# Patient Record
Sex: Female | Born: 1975 | Race: Black or African American | Hispanic: No | Marital: Single | Smoking: Never smoker
Health system: Southern US, Community
[De-identification: ages and names within clinical notes are randomized; demographics above are authoritative.]

## PROBLEM LIST (undated history)

## (undated) DIAGNOSIS — N3946 Mixed incontinence: Secondary | ICD-10-CM

## (undated) DIAGNOSIS — J3089 Other allergic rhinitis: Secondary | ICD-10-CM

## (undated) DIAGNOSIS — D259 Leiomyoma of uterus, unspecified: Secondary | ICD-10-CM

## (undated) DIAGNOSIS — G4700 Insomnia, unspecified: Secondary | ICD-10-CM

## (undated) DIAGNOSIS — N920 Excessive and frequent menstruation with regular cycle: Secondary | ICD-10-CM

## (undated) DIAGNOSIS — F32A Depression, unspecified: Secondary | ICD-10-CM

## (undated) DIAGNOSIS — N760 Acute vaginitis: Secondary | ICD-10-CM

## (undated) DIAGNOSIS — K635 Polyp of colon: Secondary | ICD-10-CM

## (undated) DIAGNOSIS — E785 Hyperlipidemia, unspecified: Secondary | ICD-10-CM

## (undated) DIAGNOSIS — Z8042 Family history of malignant neoplasm of prostate: Secondary | ICD-10-CM

## (undated) DIAGNOSIS — E119 Type 2 diabetes mellitus without complications: Secondary | ICD-10-CM

## (undated) DIAGNOSIS — M1990 Unspecified osteoarthritis, unspecified site: Secondary | ICD-10-CM

## (undated) DIAGNOSIS — G4733 Obstructive sleep apnea (adult) (pediatric): Secondary | ICD-10-CM

## (undated) DIAGNOSIS — Z803 Family history of malignant neoplasm of breast: Secondary | ICD-10-CM

## (undated) DIAGNOSIS — F329 Major depressive disorder, single episode, unspecified: Secondary | ICD-10-CM

## (undated) DIAGNOSIS — M25561 Pain in right knee: Secondary | ICD-10-CM

## (undated) DIAGNOSIS — I10 Essential (primary) hypertension: Secondary | ICD-10-CM

## (undated) DIAGNOSIS — L80 Vitiligo: Secondary | ICD-10-CM

## (undated) DIAGNOSIS — K8020 Calculus of gallbladder without cholecystitis without obstruction: Secondary | ICD-10-CM

## (undated) DIAGNOSIS — J209 Acute bronchitis, unspecified: Secondary | ICD-10-CM

## (undated) DIAGNOSIS — K219 Gastro-esophageal reflux disease without esophagitis: Secondary | ICD-10-CM

## (undated) DIAGNOSIS — N63 Unspecified lump in unspecified breast: Secondary | ICD-10-CM

## (undated) DIAGNOSIS — Z973 Presence of spectacles and contact lenses: Secondary | ICD-10-CM

## (undated) HISTORY — DX: Depression, unspecified: F32.A

## (undated) HISTORY — DX: Pain in right knee: M25.561

## (undated) HISTORY — DX: Obstructive sleep apnea (adult) (pediatric): G47.33

## (undated) HISTORY — PX: COLONOSCOPY: SHX174

## (undated) HISTORY — DX: Family history of malignant neoplasm of prostate: Z80.42

## (undated) HISTORY — DX: Polyp of colon: K63.5

## (undated) HISTORY — DX: Type 2 diabetes mellitus without complications: E11.9

## (undated) HISTORY — DX: Unspecified lump in unspecified breast: N63.0

## (undated) HISTORY — DX: Morbid (severe) obesity due to excess calories: E66.01

## (undated) HISTORY — DX: Essential (primary) hypertension: I10

## (undated) HISTORY — DX: Family history of malignant neoplasm of breast: Z80.3

## (undated) HISTORY — DX: Insomnia, unspecified: G47.00

## (undated) HISTORY — DX: Acute bronchitis, unspecified: J20.9

## (undated) HISTORY — DX: Vitiligo: L80

## (undated) HISTORY — PX: WISDOM TOOTH EXTRACTION: SHX21

## (undated) HISTORY — DX: Acute vaginitis: N76.0

## (undated) HISTORY — DX: Major depressive disorder, single episode, unspecified: F32.9

## (undated) HISTORY — DX: Hyperlipidemia, unspecified: E78.5

---

## 1998-04-29 ENCOUNTER — Encounter: Admission: RE | Admit: 1998-04-29 | Discharge: 1998-04-29 | Payer: Self-pay | Admitting: Family Medicine

## 1998-05-17 ENCOUNTER — Encounter: Admission: RE | Admit: 1998-05-17 | Discharge: 1998-05-17 | Payer: Self-pay | Admitting: Family Medicine

## 1998-07-17 ENCOUNTER — Encounter: Admission: RE | Admit: 1998-07-17 | Discharge: 1998-07-17 | Payer: Self-pay | Admitting: Family Medicine

## 1998-08-22 ENCOUNTER — Encounter: Admission: RE | Admit: 1998-08-22 | Discharge: 1998-08-22 | Payer: Self-pay | Admitting: Family Medicine

## 1998-09-17 ENCOUNTER — Encounter: Admission: RE | Admit: 1998-09-17 | Discharge: 1998-09-17 | Payer: Self-pay | Admitting: Sports Medicine

## 1998-09-27 ENCOUNTER — Encounter: Admission: RE | Admit: 1998-09-27 | Discharge: 1998-09-27 | Payer: Self-pay | Admitting: Family Medicine

## 1998-10-09 ENCOUNTER — Encounter: Admission: RE | Admit: 1998-10-09 | Discharge: 1998-10-09 | Payer: Self-pay | Admitting: Family Medicine

## 1998-10-25 ENCOUNTER — Encounter: Admission: RE | Admit: 1998-10-25 | Discharge: 1998-10-25 | Payer: Self-pay | Admitting: Family Medicine

## 1999-10-30 ENCOUNTER — Other Ambulatory Visit: Admission: RE | Admit: 1999-10-30 | Discharge: 1999-10-30 | Payer: Self-pay | Admitting: Obstetrics and Gynecology

## 1999-12-30 ENCOUNTER — Encounter: Admission: RE | Admit: 1999-12-30 | Discharge: 1999-12-30 | Payer: Self-pay | Admitting: Family Medicine

## 2000-01-29 ENCOUNTER — Encounter: Admission: RE | Admit: 2000-01-29 | Discharge: 2000-01-29 | Payer: Self-pay | Admitting: Family Medicine

## 2000-08-31 ENCOUNTER — Encounter: Admission: RE | Admit: 2000-08-31 | Discharge: 2000-08-31 | Payer: Self-pay | Admitting: Family Medicine

## 2000-09-03 ENCOUNTER — Encounter: Admission: RE | Admit: 2000-09-03 | Discharge: 2000-09-03 | Payer: Self-pay | Admitting: Family Medicine

## 2000-09-08 ENCOUNTER — Encounter: Admission: RE | Admit: 2000-09-08 | Discharge: 2000-09-08 | Payer: Self-pay | Admitting: Family Medicine

## 2000-11-23 ENCOUNTER — Other Ambulatory Visit: Admission: RE | Admit: 2000-11-23 | Discharge: 2000-11-23 | Payer: Self-pay | Admitting: Obstetrics and Gynecology

## 2001-01-20 ENCOUNTER — Encounter: Admission: RE | Admit: 2001-01-20 | Discharge: 2001-01-20 | Payer: Self-pay | Admitting: Sports Medicine

## 2001-01-24 ENCOUNTER — Encounter: Admission: RE | Admit: 2001-01-24 | Discharge: 2001-01-24 | Payer: Self-pay | Admitting: Family Medicine

## 2001-03-09 ENCOUNTER — Encounter: Admission: RE | Admit: 2001-03-09 | Discharge: 2001-03-09 | Payer: Self-pay | Admitting: Family Medicine

## 2001-03-14 ENCOUNTER — Encounter: Admission: RE | Admit: 2001-03-14 | Discharge: 2001-03-14 | Payer: Self-pay | Admitting: Family Medicine

## 2001-03-16 ENCOUNTER — Encounter: Admission: RE | Admit: 2001-03-16 | Discharge: 2001-03-16 | Payer: Self-pay | Admitting: Family Medicine

## 2001-05-13 ENCOUNTER — Encounter: Admission: RE | Admit: 2001-05-13 | Discharge: 2001-05-13 | Payer: Self-pay | Admitting: Family Medicine

## 2001-05-16 ENCOUNTER — Encounter: Admission: RE | Admit: 2001-05-16 | Discharge: 2001-05-16 | Payer: Self-pay | Admitting: Family Medicine

## 2001-05-17 ENCOUNTER — Encounter: Payer: Self-pay | Admitting: Sports Medicine

## 2001-05-17 ENCOUNTER — Encounter: Admission: RE | Admit: 2001-05-17 | Discharge: 2001-05-17 | Payer: Self-pay | Admitting: *Deleted

## 2001-06-01 ENCOUNTER — Encounter: Admission: RE | Admit: 2001-06-01 | Discharge: 2001-06-01 | Payer: Self-pay | Admitting: Family Medicine

## 2001-07-14 ENCOUNTER — Encounter: Admission: RE | Admit: 2001-07-14 | Discharge: 2001-07-14 | Payer: Self-pay | Admitting: Family Medicine

## 2001-07-29 ENCOUNTER — Encounter: Admission: RE | Admit: 2001-07-29 | Discharge: 2001-07-29 | Payer: Self-pay | Admitting: Family Medicine

## 2001-08-19 ENCOUNTER — Encounter: Admission: RE | Admit: 2001-08-19 | Discharge: 2001-08-19 | Payer: Self-pay | Admitting: Family Medicine

## 2001-08-31 ENCOUNTER — Encounter: Admission: RE | Admit: 2001-08-31 | Discharge: 2001-08-31 | Payer: Self-pay | Admitting: Family Medicine

## 2001-08-31 ENCOUNTER — Encounter: Payer: Self-pay | Admitting: Sports Medicine

## 2001-08-31 ENCOUNTER — Encounter: Admission: RE | Admit: 2001-08-31 | Discharge: 2001-08-31 | Payer: Self-pay | Admitting: Sports Medicine

## 2001-11-12 ENCOUNTER — Emergency Department (HOSPITAL_COMMUNITY): Admission: EM | Admit: 2001-11-12 | Discharge: 2001-11-12 | Payer: Self-pay | Admitting: Emergency Medicine

## 2001-11-29 ENCOUNTER — Other Ambulatory Visit: Admission: RE | Admit: 2001-11-29 | Discharge: 2001-11-29 | Payer: Self-pay | Admitting: *Deleted

## 2002-02-02 ENCOUNTER — Encounter: Admission: RE | Admit: 2002-02-02 | Discharge: 2002-02-02 | Payer: Self-pay | Admitting: Family Medicine

## 2002-02-16 ENCOUNTER — Encounter: Admission: RE | Admit: 2002-02-16 | Discharge: 2002-02-16 | Payer: Self-pay | Admitting: Family Medicine

## 2002-03-06 ENCOUNTER — Encounter: Admission: RE | Admit: 2002-03-06 | Discharge: 2002-03-06 | Payer: Self-pay | Admitting: Family Medicine

## 2002-05-04 ENCOUNTER — Encounter: Admission: RE | Admit: 2002-05-04 | Discharge: 2002-05-04 | Payer: Self-pay | Admitting: Family Medicine

## 2002-06-02 ENCOUNTER — Encounter: Admission: RE | Admit: 2002-06-02 | Discharge: 2002-06-02 | Payer: Self-pay | Admitting: Family Medicine

## 2002-08-14 ENCOUNTER — Other Ambulatory Visit: Admission: RE | Admit: 2002-08-14 | Discharge: 2002-08-14 | Payer: Self-pay | Admitting: Family Medicine

## 2002-08-17 ENCOUNTER — Encounter: Payer: Self-pay | Admitting: Family Medicine

## 2002-08-17 ENCOUNTER — Encounter: Admission: RE | Admit: 2002-08-17 | Discharge: 2002-08-17 | Payer: Self-pay | Admitting: Family Medicine

## 2004-04-21 ENCOUNTER — Encounter: Admission: RE | Admit: 2004-04-21 | Discharge: 2004-07-20 | Payer: Self-pay | Admitting: Family Medicine

## 2004-05-14 ENCOUNTER — Other Ambulatory Visit: Admission: RE | Admit: 2004-05-14 | Discharge: 2004-05-14 | Payer: Self-pay | Admitting: Family Medicine

## 2005-06-09 ENCOUNTER — Other Ambulatory Visit: Admission: RE | Admit: 2005-06-09 | Discharge: 2005-06-09 | Payer: Self-pay | Admitting: Family Medicine

## 2005-07-22 ENCOUNTER — Encounter: Admission: RE | Admit: 2005-07-22 | Discharge: 2005-07-22 | Payer: Self-pay | Admitting: Family Medicine

## 2005-10-02 ENCOUNTER — Ambulatory Visit: Payer: Self-pay | Admitting: Family Medicine

## 2005-11-20 ENCOUNTER — Ambulatory Visit: Payer: Self-pay | Admitting: Family Medicine

## 2005-12-17 ENCOUNTER — Ambulatory Visit: Payer: Self-pay | Admitting: Family Medicine

## 2006-01-18 ENCOUNTER — Ambulatory Visit: Payer: Self-pay | Admitting: Family Medicine

## 2006-03-16 ENCOUNTER — Encounter: Admission: RE | Admit: 2006-03-16 | Discharge: 2006-06-14 | Payer: Self-pay | Admitting: Family Medicine

## 2006-03-24 ENCOUNTER — Ambulatory Visit: Payer: Self-pay | Admitting: Family Medicine

## 2006-04-28 ENCOUNTER — Ambulatory Visit: Payer: Self-pay | Admitting: Family Medicine

## 2006-04-28 LAB — CONVERTED CEMR LAB
GFR calc Af Amer: 94 mL/min
GFR calc non Af Amer: 78 mL/min
Glucose, Bld: 112 mg/dL — ABNORMAL HIGH (ref 70–99)
Potassium: 3.7 meq/L (ref 3.5–5.1)
Sodium: 139 meq/L (ref 135–145)

## 2006-05-27 ENCOUNTER — Ambulatory Visit: Payer: Self-pay | Admitting: Family Medicine

## 2006-05-27 LAB — CONVERTED CEMR LAB
BUN: 8 mg/dL (ref 6–23)
GFR calc Af Amer: 83 mL/min
Potassium: 3.6 meq/L (ref 3.5–5.1)
Sodium: 139 meq/L (ref 135–145)

## 2006-06-21 ENCOUNTER — Ambulatory Visit: Payer: Self-pay | Admitting: Family Medicine

## 2006-06-21 LAB — CONVERTED CEMR LAB
BUN: 10 mg/dL (ref 6–23)
CO2: 31 meq/L (ref 19–32)
Calcium: 9.1 mg/dL (ref 8.4–10.5)
GFR calc Af Amer: 94 mL/min
GFR calc non Af Amer: 78 mL/min
Potassium: 3.9 meq/L (ref 3.5–5.1)

## 2006-06-23 ENCOUNTER — Other Ambulatory Visit: Admission: RE | Admit: 2006-06-23 | Discharge: 2006-06-23 | Payer: Self-pay | Admitting: Family Medicine

## 2006-06-23 ENCOUNTER — Encounter (INDEPENDENT_AMBULATORY_CARE_PROVIDER_SITE_OTHER): Payer: Self-pay | Admitting: Family Medicine

## 2006-06-23 ENCOUNTER — Ambulatory Visit: Payer: Self-pay | Admitting: Family Medicine

## 2006-06-23 LAB — CONVERTED CEMR LAB
Basophils Relative: 1 % (ref 0.0–1.0)
CO2: 26 meq/L (ref 19–32)
Eosinophils Absolute: 0.1 10*3/uL (ref 0.0–0.6)
Eosinophils Relative: 1.2 % (ref 0.0–5.0)
GFR calc Af Amer: 94 mL/min
GFR calc non Af Amer: 78 mL/min
Glucose, Bld: 111 mg/dL — ABNORMAL HIGH (ref 70–99)
HDL: 53.9 mg/dL (ref >39.0)
Hemoglobin: 12.1 g/dL (ref 12.0–15.0)
Lymphocytes Relative: 33.8 % (ref 12.0–46.0)
MCV: 88.2 fL (ref 78.0–100.0)
Monocytes Absolute: 0.4 10*3/uL (ref 0.2–0.7)
Monocytes Relative: 5.8 % (ref 3.0–11.0)
Neutro Abs: 3.9 10*3/uL (ref 1.4–7.7)
Platelets: 257 10*3/uL (ref 150–400)
Potassium: 3.7 meq/L (ref 3.5–5.1)
Sodium: 140 meq/L (ref 135–145)
Triglycerides: 153 mg/dL — ABNORMAL HIGH (ref 0–149)
VLDL: 31 mg/dL (ref 0–40)
WBC: 6.8 10*3/uL (ref 4.5–10.5)

## 2006-06-29 ENCOUNTER — Ambulatory Visit: Payer: Self-pay | Admitting: Family Medicine

## 2006-07-20 DIAGNOSIS — E1169 Type 2 diabetes mellitus with other specified complication: Secondary | ICD-10-CM | POA: Insufficient documentation

## 2006-07-20 DIAGNOSIS — L80 Vitiligo: Secondary | ICD-10-CM | POA: Insufficient documentation

## 2006-07-20 DIAGNOSIS — G4700 Insomnia, unspecified: Secondary | ICD-10-CM | POA: Insufficient documentation

## 2006-07-20 DIAGNOSIS — D649 Anemia, unspecified: Secondary | ICD-10-CM | POA: Insufficient documentation

## 2006-07-20 DIAGNOSIS — F329 Major depressive disorder, single episode, unspecified: Secondary | ICD-10-CM

## 2006-07-20 DIAGNOSIS — N63 Unspecified lump in unspecified breast: Secondary | ICD-10-CM | POA: Insufficient documentation

## 2006-07-20 DIAGNOSIS — F32A Depression, unspecified: Secondary | ICD-10-CM | POA: Insufficient documentation

## 2006-07-20 DIAGNOSIS — E785 Hyperlipidemia, unspecified: Secondary | ICD-10-CM | POA: Insufficient documentation

## 2006-08-06 ENCOUNTER — Telehealth (INDEPENDENT_AMBULATORY_CARE_PROVIDER_SITE_OTHER): Payer: Self-pay | Admitting: Family Medicine

## 2006-08-30 ENCOUNTER — Ambulatory Visit: Payer: Self-pay | Admitting: Family Medicine

## 2006-08-30 DIAGNOSIS — I10 Essential (primary) hypertension: Secondary | ICD-10-CM | POA: Insufficient documentation

## 2006-08-30 DIAGNOSIS — R079 Chest pain, unspecified: Secondary | ICD-10-CM | POA: Insufficient documentation

## 2006-08-30 DIAGNOSIS — I152 Hypertension secondary to endocrine disorders: Secondary | ICD-10-CM | POA: Insufficient documentation

## 2006-08-31 ENCOUNTER — Telehealth (INDEPENDENT_AMBULATORY_CARE_PROVIDER_SITE_OTHER): Payer: Self-pay | Admitting: *Deleted

## 2006-09-02 ENCOUNTER — Ambulatory Visit: Payer: Self-pay | Admitting: Family Medicine

## 2006-09-02 ENCOUNTER — Ambulatory Visit: Payer: Self-pay | Admitting: Cardiovascular Disease

## 2006-09-06 ENCOUNTER — Telehealth (INDEPENDENT_AMBULATORY_CARE_PROVIDER_SITE_OTHER): Payer: Self-pay | Admitting: *Deleted

## 2006-09-14 ENCOUNTER — Ambulatory Visit: Payer: Self-pay

## 2006-09-14 ENCOUNTER — Encounter (INDEPENDENT_AMBULATORY_CARE_PROVIDER_SITE_OTHER): Payer: Self-pay | Admitting: Family Medicine

## 2006-09-15 ENCOUNTER — Ambulatory Visit: Payer: Self-pay

## 2006-11-17 ENCOUNTER — Ambulatory Visit: Payer: Self-pay | Admitting: Family Medicine

## 2006-11-17 ENCOUNTER — Telehealth (INDEPENDENT_AMBULATORY_CARE_PROVIDER_SITE_OTHER): Payer: Self-pay | Admitting: *Deleted

## 2006-11-17 DIAGNOSIS — R109 Unspecified abdominal pain: Secondary | ICD-10-CM | POA: Insufficient documentation

## 2007-04-11 ENCOUNTER — Ambulatory Visit: Payer: Self-pay | Admitting: Internal Medicine

## 2007-04-11 ENCOUNTER — Ambulatory Visit: Payer: Self-pay | Admitting: Family Medicine

## 2007-04-11 DIAGNOSIS — R51 Headache: Secondary | ICD-10-CM | POA: Insufficient documentation

## 2007-04-11 DIAGNOSIS — R519 Headache, unspecified: Secondary | ICD-10-CM | POA: Insufficient documentation

## 2007-04-12 ENCOUNTER — Telehealth (INDEPENDENT_AMBULATORY_CARE_PROVIDER_SITE_OTHER): Payer: Self-pay | Admitting: *Deleted

## 2007-04-27 ENCOUNTER — Encounter (INDEPENDENT_AMBULATORY_CARE_PROVIDER_SITE_OTHER): Payer: Self-pay | Admitting: *Deleted

## 2007-04-27 ENCOUNTER — Ambulatory Visit: Payer: Self-pay | Admitting: Family Medicine

## 2007-04-27 LAB — CONVERTED CEMR LAB
CO2: 34 meq/L — ABNORMAL HIGH (ref 19–32)
Calcium: 9.3 mg/dL (ref 8.4–10.5)
Chloride: 103 meq/L (ref 96–112)
Creatinine, Ser: 0.9 mg/dL (ref 0.4–1.2)
GFR calc non Af Amer: 77 mL/min
Glucose, Bld: 102 mg/dL — ABNORMAL HIGH (ref 70–99)

## 2007-05-25 ENCOUNTER — Ambulatory Visit: Payer: Self-pay | Admitting: Family Medicine

## 2007-07-11 ENCOUNTER — Ambulatory Visit: Payer: Self-pay | Admitting: Internal Medicine

## 2007-07-11 DIAGNOSIS — J209 Acute bronchitis, unspecified: Secondary | ICD-10-CM | POA: Insufficient documentation

## 2007-07-26 ENCOUNTER — Ambulatory Visit: Payer: Self-pay | Admitting: Internal Medicine

## 2007-07-26 DIAGNOSIS — K625 Hemorrhage of anus and rectum: Secondary | ICD-10-CM | POA: Insufficient documentation

## 2007-07-28 ENCOUNTER — Telehealth (INDEPENDENT_AMBULATORY_CARE_PROVIDER_SITE_OTHER): Payer: Self-pay | Admitting: *Deleted

## 2007-11-14 ENCOUNTER — Telehealth: Payer: Self-pay | Admitting: Family Medicine

## 2007-12-07 ENCOUNTER — Ambulatory Visit: Payer: Self-pay | Admitting: Family Medicine

## 2007-12-07 DIAGNOSIS — K6289 Other specified diseases of anus and rectum: Secondary | ICD-10-CM | POA: Insufficient documentation

## 2007-12-07 DIAGNOSIS — N760 Acute vaginitis: Secondary | ICD-10-CM | POA: Insufficient documentation

## 2007-12-22 ENCOUNTER — Encounter: Admission: RE | Admit: 2007-12-22 | Discharge: 2007-12-22 | Payer: Self-pay | Admitting: Family Medicine

## 2007-12-26 ENCOUNTER — Telehealth: Payer: Self-pay | Admitting: Family Medicine

## 2008-01-02 ENCOUNTER — Encounter: Admission: RE | Admit: 2008-01-02 | Discharge: 2008-01-02 | Payer: Self-pay | Admitting: Family Medicine

## 2008-01-04 ENCOUNTER — Telehealth (INDEPENDENT_AMBULATORY_CARE_PROVIDER_SITE_OTHER): Payer: Self-pay | Admitting: *Deleted

## 2008-01-18 ENCOUNTER — Encounter: Payer: Self-pay | Admitting: Family Medicine

## 2008-01-18 ENCOUNTER — Other Ambulatory Visit: Admission: RE | Admit: 2008-01-18 | Discharge: 2008-01-18 | Payer: Self-pay | Admitting: Family Medicine

## 2008-01-18 ENCOUNTER — Ambulatory Visit: Payer: Self-pay | Admitting: Family Medicine

## 2008-01-18 LAB — CONVERTED CEMR LAB
Basophils Absolute: 0.1 10*3/uL (ref 0.0–0.1)
Bilirubin, Direct: 0.1 mg/dL (ref 0.0–0.3)
CO2: 33 meq/L — ABNORMAL HIGH (ref 19–32)
Calcium: 9.3 mg/dL (ref 8.4–10.5)
Cholesterol: 187 mg/dL (ref 0–200)
GFR calc Af Amer: 93 mL/min
HDL: 43.8 mg/dL (ref >39.0)
Hemoglobin: 13.1 g/dL (ref 12.0–15.0)
LDL Cholesterol: 124 mg/dL — ABNORMAL HIGH (ref 0–99)
Lymphocytes Relative: 35.1 % (ref 12.0–46.0)
MCHC: 34.4 g/dL (ref 30.0–36.0)
Monocytes Absolute: 0.4 10*3/uL (ref 0.1–1.0)
Neutro Abs: 2.7 10*3/uL (ref 1.4–7.7)
Platelets: 245 10*3/uL (ref 150–400)
RDW: 12.7 % (ref 11.5–14.6)
Sodium: 141 meq/L (ref 135–145)
Total Bilirubin: 0.5 mg/dL (ref 0.3–1.2)
Triglycerides: 95 mg/dL (ref 0–149)

## 2008-01-27 ENCOUNTER — Encounter (INDEPENDENT_AMBULATORY_CARE_PROVIDER_SITE_OTHER): Payer: Self-pay | Admitting: *Deleted

## 2008-04-03 ENCOUNTER — Telehealth: Payer: Self-pay | Admitting: Family Medicine

## 2008-04-16 ENCOUNTER — Ambulatory Visit: Payer: Self-pay | Admitting: Family Medicine

## 2008-04-16 ENCOUNTER — Telehealth: Payer: Self-pay | Admitting: Family Medicine

## 2008-04-16 DIAGNOSIS — M25569 Pain in unspecified knee: Secondary | ICD-10-CM | POA: Insufficient documentation

## 2008-05-09 ENCOUNTER — Ambulatory Visit: Payer: Self-pay | Admitting: Family Medicine

## 2008-05-11 ENCOUNTER — Encounter (INDEPENDENT_AMBULATORY_CARE_PROVIDER_SITE_OTHER): Payer: Self-pay | Admitting: *Deleted

## 2008-05-25 ENCOUNTER — Ambulatory Visit: Payer: Self-pay | Admitting: *Deleted

## 2008-05-25 ENCOUNTER — Ambulatory Visit: Payer: Self-pay | Admitting: Sports Medicine

## 2008-07-11 ENCOUNTER — Ambulatory Visit: Payer: Self-pay | Admitting: Family Medicine

## 2008-07-11 LAB — CONVERTED CEMR LAB

## 2008-07-20 ENCOUNTER — Ambulatory Visit: Payer: Self-pay | Admitting: Family Medicine

## 2008-07-23 ENCOUNTER — Encounter (INDEPENDENT_AMBULATORY_CARE_PROVIDER_SITE_OTHER): Payer: Self-pay | Admitting: *Deleted

## 2008-07-23 LAB — CONVERTED CEMR LAB
AST: 24 units/L (ref 0–37)
Alkaline Phosphatase: 46 units/L (ref 39–117)
LDL Cholesterol: 115 mg/dL — ABNORMAL HIGH (ref 0–99)
Total Bilirubin: 0.5 mg/dL (ref 0.3–1.2)
VLDL: 16.4 mg/dL (ref 0.0–40.0)

## 2009-01-29 ENCOUNTER — Encounter: Admission: RE | Admit: 2009-01-29 | Discharge: 2009-01-29 | Payer: Self-pay | Admitting: Family Medicine

## 2009-01-29 ENCOUNTER — Other Ambulatory Visit: Admission: RE | Admit: 2009-01-29 | Discharge: 2009-01-29 | Payer: Self-pay | Admitting: Family Medicine

## 2009-01-29 ENCOUNTER — Ambulatory Visit: Payer: Self-pay | Admitting: Family Medicine

## 2009-01-29 DIAGNOSIS — Z87898 Personal history of other specified conditions: Secondary | ICD-10-CM

## 2009-01-29 DIAGNOSIS — H9190 Unspecified hearing loss, unspecified ear: Secondary | ICD-10-CM | POA: Insufficient documentation

## 2009-01-29 LAB — CONVERTED CEMR LAB
Bilirubin Urine: NEGATIVE
Glucose, Urine, Semiquant: NEGATIVE
Urobilinogen, UA: 0.2
pH: 6.5

## 2009-02-08 ENCOUNTER — Encounter (INDEPENDENT_AMBULATORY_CARE_PROVIDER_SITE_OTHER): Payer: Self-pay | Admitting: *Deleted

## 2009-02-13 ENCOUNTER — Ambulatory Visit: Payer: Self-pay | Admitting: Family Medicine

## 2009-02-15 ENCOUNTER — Telehealth (INDEPENDENT_AMBULATORY_CARE_PROVIDER_SITE_OTHER): Payer: Self-pay | Admitting: *Deleted

## 2009-02-15 ENCOUNTER — Encounter (INDEPENDENT_AMBULATORY_CARE_PROVIDER_SITE_OTHER): Payer: Self-pay | Admitting: *Deleted

## 2009-02-15 LAB — CONVERTED CEMR LAB
AST: 25 units/L (ref 0–37)
Albumin: 3.7 g/dL (ref 3.5–5.2)
Alkaline Phosphatase: 47 units/L (ref 39–117)
Basophils Absolute: 0 10*3/uL (ref 0.0–0.1)
Bilirubin, Direct: 0 mg/dL (ref 0.0–0.3)
Calcium: 9.3 mg/dL (ref 8.4–10.5)
Direct LDL: 148.6 mg/dL
GFR calc non Af Amer: 91.89 mL/min (ref >60)
Glucose, Bld: 104 mg/dL — ABNORMAL HIGH (ref 70–99)
Hemoglobin: 13.3 g/dL (ref 12.0–15.0)
Lymphocytes Relative: 33.6 % (ref 12.0–46.0)
Monocytes Relative: 5.5 % (ref 3.0–12.0)
Neutro Abs: 3.5 10*3/uL (ref 1.4–7.7)
Neutrophils Relative %: 59.5 % (ref 43.0–77.0)
RDW: 12 % (ref 11.5–14.6)
Sodium: 138 meq/L (ref 135–145)
TSH: 1.33 microintl units/mL (ref 0.35–5.50)
Total Bilirubin: 0.7 mg/dL (ref 0.3–1.2)
Total CHOL/HDL Ratio: 5
VLDL: 18.6 mg/dL (ref 0.0–40.0)

## 2009-03-02 DIAGNOSIS — G4733 Obstructive sleep apnea (adult) (pediatric): Secondary | ICD-10-CM

## 2009-03-02 HISTORY — DX: Obstructive sleep apnea (adult) (pediatric): G47.33

## 2009-03-07 ENCOUNTER — Telehealth (INDEPENDENT_AMBULATORY_CARE_PROVIDER_SITE_OTHER): Payer: Self-pay | Admitting: *Deleted

## 2009-03-11 ENCOUNTER — Ambulatory Visit: Payer: Self-pay | Admitting: Family

## 2009-03-11 DIAGNOSIS — R404 Transient alteration of awareness: Secondary | ICD-10-CM | POA: Insufficient documentation

## 2009-03-11 DIAGNOSIS — Z8719 Personal history of other diseases of the digestive system: Secondary | ICD-10-CM | POA: Insufficient documentation

## 2009-03-11 LAB — CONVERTED CEMR LAB: Hemoglobin: 12.9 g/dL

## 2009-03-13 ENCOUNTER — Encounter (INDEPENDENT_AMBULATORY_CARE_PROVIDER_SITE_OTHER): Payer: Self-pay | Admitting: *Deleted

## 2009-03-18 ENCOUNTER — Telehealth (INDEPENDENT_AMBULATORY_CARE_PROVIDER_SITE_OTHER): Payer: Self-pay | Admitting: *Deleted

## 2009-03-27 ENCOUNTER — Ambulatory Visit: Payer: Self-pay | Admitting: Pulmonary Disease

## 2009-03-27 ENCOUNTER — Ambulatory Visit (HOSPITAL_BASED_OUTPATIENT_CLINIC_OR_DEPARTMENT_OTHER): Admission: RE | Admit: 2009-03-27 | Discharge: 2009-03-27 | Payer: Self-pay | Admitting: Pulmonary Disease

## 2009-03-27 DIAGNOSIS — G4700 Insomnia, unspecified: Secondary | ICD-10-CM | POA: Insufficient documentation

## 2009-03-27 DIAGNOSIS — G4733 Obstructive sleep apnea (adult) (pediatric): Secondary | ICD-10-CM | POA: Insufficient documentation

## 2009-04-01 ENCOUNTER — Encounter (INDEPENDENT_AMBULATORY_CARE_PROVIDER_SITE_OTHER): Payer: Self-pay | Admitting: *Deleted

## 2009-04-01 ENCOUNTER — Ambulatory Visit: Payer: Self-pay | Admitting: Gastroenterology

## 2009-04-01 DIAGNOSIS — K921 Melena: Secondary | ICD-10-CM | POA: Insufficient documentation

## 2009-04-01 DIAGNOSIS — K219 Gastro-esophageal reflux disease without esophagitis: Secondary | ICD-10-CM

## 2009-04-10 ENCOUNTER — Ambulatory Visit: Payer: Self-pay | Admitting: Pulmonary Disease

## 2009-04-10 ENCOUNTER — Telehealth (INDEPENDENT_AMBULATORY_CARE_PROVIDER_SITE_OTHER): Payer: Self-pay | Admitting: *Deleted

## 2009-04-12 ENCOUNTER — Ambulatory Visit: Payer: Self-pay | Admitting: Gastroenterology

## 2009-04-15 ENCOUNTER — Telehealth: Payer: Self-pay | Admitting: Gastroenterology

## 2009-04-17 ENCOUNTER — Encounter: Payer: Self-pay | Admitting: Gastroenterology

## 2009-04-22 ENCOUNTER — Ambulatory Visit: Payer: Self-pay | Admitting: Pulmonary Disease

## 2009-05-17 ENCOUNTER — Telehealth (INDEPENDENT_AMBULATORY_CARE_PROVIDER_SITE_OTHER): Payer: Self-pay | Admitting: *Deleted

## 2009-05-23 ENCOUNTER — Ambulatory Visit: Payer: Self-pay | Admitting: Gastroenterology

## 2009-06-03 ENCOUNTER — Ambulatory Visit: Payer: Self-pay | Admitting: Pulmonary Disease

## 2009-07-11 ENCOUNTER — Ambulatory Visit: Payer: Self-pay | Admitting: Family Medicine

## 2009-07-11 DIAGNOSIS — J069 Acute upper respiratory infection, unspecified: Secondary | ICD-10-CM | POA: Insufficient documentation

## 2009-08-24 ENCOUNTER — Encounter: Payer: Self-pay | Admitting: Pulmonary Disease

## 2009-09-20 ENCOUNTER — Encounter: Payer: Self-pay | Admitting: Family Medicine

## 2009-10-03 ENCOUNTER — Telehealth (INDEPENDENT_AMBULATORY_CARE_PROVIDER_SITE_OTHER): Payer: Self-pay | Admitting: *Deleted

## 2009-10-17 ENCOUNTER — Telehealth (INDEPENDENT_AMBULATORY_CARE_PROVIDER_SITE_OTHER): Payer: Self-pay | Admitting: *Deleted

## 2009-11-16 ENCOUNTER — Encounter: Payer: Self-pay | Admitting: Pulmonary Disease

## 2009-11-27 ENCOUNTER — Ambulatory Visit (HOSPITAL_BASED_OUTPATIENT_CLINIC_OR_DEPARTMENT_OTHER): Admission: RE | Admit: 2009-11-27 | Discharge: 2009-11-27 | Payer: Self-pay | Admitting: Internal Medicine

## 2009-11-27 ENCOUNTER — Encounter: Payer: Self-pay | Admitting: Family Medicine

## 2009-11-27 ENCOUNTER — Telehealth: Payer: Self-pay | Admitting: Family

## 2009-11-27 ENCOUNTER — Ambulatory Visit: Payer: Self-pay | Admitting: Family

## 2009-11-27 ENCOUNTER — Ambulatory Visit: Payer: Self-pay | Admitting: Diagnostic Radiology

## 2009-11-27 DIAGNOSIS — R358 Other polyuria: Secondary | ICD-10-CM

## 2009-11-27 DIAGNOSIS — R3589 Other polyuria: Secondary | ICD-10-CM | POA: Insufficient documentation

## 2009-11-27 DIAGNOSIS — R609 Edema, unspecified: Secondary | ICD-10-CM | POA: Insufficient documentation

## 2009-11-27 DIAGNOSIS — R209 Unspecified disturbances of skin sensation: Secondary | ICD-10-CM | POA: Insufficient documentation

## 2009-11-27 LAB — CONVERTED CEMR LAB
Albumin: 4 g/dL (ref 3.5–5.2)
Alkaline Phosphatase: 51 units/L (ref 39–117)
Beta hcg, urine, semiquantitative: NEGATIVE
Bilirubin Urine: NEGATIVE
Bilirubin, Direct: 0.1 mg/dL (ref 0.0–0.3)
Calcium: 9.6 mg/dL (ref 8.4–10.5)
Glucose, Bld: 110 mg/dL — ABNORMAL HIGH (ref 70–99)
Glucose, Urine, Semiquant: NEGATIVE
Ketones, urine, test strip: NEGATIVE
Sodium: 139 meq/L (ref 135–145)
Specific Gravity, Urine: 1.02
Total Bilirubin: 0.2 mg/dL — ABNORMAL LOW (ref 0.3–1.2)
pH: 6

## 2009-11-29 ENCOUNTER — Telehealth: Payer: Self-pay | Admitting: Family

## 2009-11-29 DIAGNOSIS — R7309 Other abnormal glucose: Secondary | ICD-10-CM | POA: Insufficient documentation

## 2009-12-13 ENCOUNTER — Ambulatory Visit: Payer: Self-pay | Admitting: Pulmonary Disease

## 2009-12-17 ENCOUNTER — Ambulatory Visit (HOSPITAL_COMMUNITY): Admission: RE | Admit: 2009-12-17 | Discharge: 2009-12-17 | Payer: Self-pay | Admitting: Family

## 2009-12-17 ENCOUNTER — Ambulatory Visit: Payer: Self-pay | Admitting: Internal Medicine

## 2009-12-17 ENCOUNTER — Encounter: Payer: Self-pay | Admitting: Internal Medicine

## 2009-12-17 ENCOUNTER — Ambulatory Visit: Payer: Self-pay

## 2009-12-18 ENCOUNTER — Telehealth: Payer: Self-pay | Admitting: Family

## 2010-03-23 ENCOUNTER — Encounter: Payer: Self-pay | Admitting: Family Medicine

## 2010-03-30 LAB — CONVERTED CEMR LAB
BUN: 6 mg/dL (ref 6–23)
CO2: 29 meq/L (ref 19–32)
Calcium: 9 mg/dL (ref 8.4–10.5)
Chloride: 109 meq/L (ref 96–112)
Creatinine, Ser: 1 mg/dL (ref 0.4–1.2)
GFR calc Af Amer: 83 mL/min
Glucose, Bld: 100 mg/dL — ABNORMAL HIGH (ref 70–99)

## 2010-04-03 NOTE — Letter (Signed)
Summary: Primary Care Consult Scheduled Letter  [facility] [place]  [address] [place], [place] [address]   Phone: [phone]  Fax: [phone]      03/13/2009 MRN: [id number]  [facility] [address] [place], [place]  [address]    Dear Ms. Shelden,    We have scheduled an appointment for you.  At the recommendation of Sandford Craze, FNP, we have scheduled you a consult with Dr. Marcelyn Bruins with [facility] on 03-25-2009 at 2:30pm.  Their address is [address], [place] [address]. The office phone number is [phone].  If this appointment day and time is not convenient for you, please feel free to call the office of the doctor you are being referred to at the number listed above and reschedule the appointment.    It is important for you to keep your scheduled appointments. We are here to make sure you are given good patient care.   Thank you,    Renee, Patient Care Coordinator [facility] [place]    **IF YOU ARE UNABLE TO KEEP THIS APPOINTMENT, OR NEED TO RESCHEDULE, PLEASE GIVE  PULMONARY A 24 HOUR NOTICE TO AVOID A $50 FEE**

## 2010-04-03 NOTE — Progress Notes (Signed)
  Phone Note Outgoing Call   Call placed by: Lemont Fillers FNP,  November 29, 2009 11:40 AM Call placed to: Patient Summary of Call: Called patient, reviewed lab work- (neg urine) and borderline DM (A1C 6.4).  Reviewed diabetic diet, weight loss, exercise.  Pt will complete 2-D echo to further evaluated her LE edema.  Recommended that she schedule f/u with Dr. Beverely Low in a few weeks.  If still having issues with urinary frequency, can further discuss possibility of overactive bladder at that time. Initial call taken by: Lemont Fillers FNP,  November 29, 2009 11:43 AM  New Problems: HYPERGLYCEMIA (ICD-790.29)   New Problems: HYPERGLYCEMIA (ICD-790.29)

## 2010-04-03 NOTE — Letter (Signed)
Summary: Primary Care Consult Scheduled Letter  [facility] [place]  [address] [place], [place] [address]   Phone: [phone]  Fax: [phone]      03/13/2009 MRN: [id number]  [facility] [address] [place], [place]  [address]    Dear Ms. Knierim,    We have scheduled an appointment for you.  At the recommendation of Sandford Craze, FNP, we have scheduled you a consult with Dr. Claudette Head with [facility] on 04-01-2009 at 1:45pm.  Their address is [address]rd [address], [place] [address]. The office phone number is [phone].  If this appointment day and time is not convenient for you, please feel free to call the office of the doctor you are being referred to at the number listed above and reschedule the appointment.    It is important for you to keep your scheduled appointments. We are here to make sure you are given good patient care.   Thank you,    Renee, Patient Care Coordinator [facility] [place]    **IF YOU ARE UNABLE TO KEEP THIS APPOINTMENT, OR NEED TO RESCHEDULE, PLEASE GIVE [facility] A 24 HOUR NOTICE TO AVOID A $50 FEE**

## 2010-04-03 NOTE — Progress Notes (Signed)
Summary: BENICAR REFILL   Phone Note Refill Request Message from:  Fax from Pharmacy on [facility] fax [phone]  Refills Requested: Medication #1:  BENICAR HCT 40-25 MG  TABS 1 by mouth once daily Initial call taken by: Barb Merino,  March 07, 2009 8:57 AM    Prescriptions: BENICAR HCT 40-25 MG  TABS (OLMESARTAN MEDOXOMIL-HCTZ) 1 by mouth once daily  #30 x 3   Entered by:   Doristine Devoid   Authorized by:   Neena Rhymes MD   Signed by:   Doristine Devoid on 03/07/2009   Method used:   [facility]        [facility]  [phone]* (retail)       [address] [place], [place]  [address]       Ph: [phone] or [phone]       Fax: [phone]   RxID:   [id number]

## 2010-04-03 NOTE — Letter (Signed)
Summary: [facility]  [facility]   Imported By: Lanelle Bal 09/27/2009 12:15:40  _____________________________________________________________________  External Attachment:    Type:   Image     Comment:   External Document

## 2010-04-03 NOTE — Progress Notes (Signed)
Summary: labs from [facility]  Phone Note Outgoing Call   Call placed by: Doristine Devoid CMA,  October 03, 2009 9:41 AM Call placed to: Patient Summary of Call: pt's labs from [facility] indicate that she is early diabetic.  needs to pay special attention to diet and exercise to control glucose and avoid meds.  will need to follow at least every 4 months   Follow-up for Phone Call        left message on machine .......Marland KitchenDoristine Devoid CMA  October 03, 2009 9:41 AM   Additional Follow-up for Phone Call Additional follow up Details #1::        Patient is aware and will call back and make appt.  Additional Follow-up by: Harold Barban,  October 04, 2009 1:30 PM

## 2010-04-03 NOTE — Assessment & Plan Note (Signed)
Summary: blood in stool/kdc   Vital Signs:  Patient profile:   35 year old female Weight:      298.6 pounds Temp:     98.5 degrees F oral BP sitting:   126 / 80  (left arm)  Vitals Entered By: Doristine Devoid (March 11, 2009 8:51 AM) CC: noticed blood in stool w/ bowel movement  Comments -no burning or discomfort -no external hemorrhoid    Primary Care Provider:  Neena Rhymes MD  CC:  noticed blood in stool w/ bowel movement .  History of Present Illness: Ms Gosney is a 35 year old female who presents today with c/o BRBPR x 1 one week ago.  Then patient notes that her menses began- so it has been difficult to determine if she continues to have blood in stool.  Notes that she often has discomfort with defecation.    Also notes that she is very unrested in the AM's.  Often wakes up suddenly in the night.  Fraternal twin sister has OSA, she is concerned that she too may have OSA.  Has been told that she snores.  Allergies: No Known Drug Allergies  Review of Systems       Notes twice daily BM's in general.    Denies hard stools.   Notes that stools are dark.     Physical Exam  General:  Obese, AA female, awake, alert NAD Head:  Normocephalic and atraumatic without obvious abnormalities. Head partially shaved Neck:  No deformities, masses, or tenderness noted. Thick neck noted Lungs:  Normal respiratory effort, chest expands symmetrically. Lungs are clear to auscultation, no crackles or wheezes. Heart:  Normal rate and regular rhythm. S1 and S2 normal without gallop, murmur, click, rub or other extra sounds. Rectal:  No external abnormalities noted. Normal sphincter tone. No rectal masses or tenderness. Hard stool noted in vault.  No palpable internal hemorrhoids.  Heme + stool in vault Psych:  Cognition and judgment appear intact. Alert and cooperative with normal attention span and concentration. No apparent delusions, illusions, hallucinations   Impression &  Recommendations:  Problem # 1:  HEMATOCHEZIA, HX OF (ICD-V12.79)  +FOB today, add empiric PPI.  Hgb is stable.  As this problem is recurrent- will refer to GI for further evaluation.  Orders: Gastroenterology Referral (GI) Hgb ([id number])  Problem # 2:  SLEEPINESS (ICD-780.09) Assessment: Unchanged Will refer for a sleep study to rule out underlying OSA.  Orders: Misc. Referral (Misc. Ref)  Complete Medication List: 1)  Valtrex 500 Mg Tabs (Valacyclovir hcl) .... Take one tablet by mouth daily 2)  Benicar Hct 40-25 Mg Tabs (Olmesartan medoxomil-hctz) .Marland Kitchen.. 1 by mouth once daily 3)  Lateral J Brace  .... Use during activity 4)  Prilosec Otc 20 Mg Tbec (Omeprazole magnesium) .... One tablet by mouth daily  Patient Instructions: 1)  You will be contacted about your referral to GI and your sleep study.   2)  Go to ER if severe bleeding from rectum. 3)  Take prilosec OTC daily. 4)  Please schedule a follow-up appointment in 1 month.  Laboratory Results   CBC   HGB:  12.9 g/dL   (Normal Range: 29.5-62.1 in Males, 12.0-15.0 in Females)

## 2010-04-03 NOTE — Progress Notes (Signed)
Summary: echo result  Phone Note Outgoing Call   Summary of Call: Please call the patient and let her know that her echo shows normal heart function. Initial call taken by: Lemont Fillers FNP,  December 18, 2009 1:06 PM  Follow-up for Phone Call        Pt notified. Nicki Guadalajara Fergerson CMA Duncan Dull)  December 18, 2009 1:22 PM

## 2010-04-03 NOTE — Assessment & Plan Note (Signed)
Summary: ears clogged//lch   Vital Signs:  Patient profile:   35 year old female Weight:      290 pounds Temp:     98.6 degrees F oral BP sitting:   132 / 84  (left arm)  Vitals Entered By: Doristine Devoid (Jul 11, 2009 1:44 PM) CC: sinus congestion and ear clogged up  Comments -went to urgent care on sat given amox and flonase not much relief   History of Present Illness: 35 yo woman here today for sinus congestion and ear pressure.  sxs started 8 days ago.  seen at [facility] on Saturday and started on Amox and flonase.  still on abx- 1 tab two times a day.  using flonase daily.  + nasal congestion, runny nose.  minimal facial pain, pressure.  not using any decongestants.  told to take antihistamine- Chlortrimaton.  no fevers.  no known sick contacts.  Current Medications (verified): 1)  Valtrex 500 Mg Tabs (Valacyclovir Hcl) .... Take One Tablet By Mouth Daily 2)  Benicar Hct 40-25 Mg  Tabs (Olmesartan Medoxomil-Hctz) .Marland Kitchen.. 1 By Mouth Once Daily 3)  Lateral J Brace .... Use During Activity 4)  Prometrium 200 Mg Caps (Progesterone Micronized) .... Take One Tablet At Bedtime X10 Days  Allergies (verified): 1)  ! * Latex 2)  ! Adhesive Tape  Review of Systems      See HPI  Physical Exam  General:  Obese, AA female, awake, alert NAD Head:  Normocephalic and atraumatic without obvious abnormalities. Head partially shaved Eyes:  no injxn or inflammation Ears:  TMs retracted bilaterally w/ some serous fluid Nose:  mild congestion Mouth:  + PND Neck:  No deformities, masses, or tenderness noted. Thick neck noted Lungs:  Normal respiratory effort, chest expands symmetrically. Lungs are clear to auscultation, no crackles or wheezes.  no cough Heart:  Normal rate and regular rhythm. S1 and S2 normal without gallop, murmur, click, rub or other extra sounds. Psych:  somewhat irritated and hostile   Impression & Recommendations:  Problem # 1:  URI (ICD-465.9) Assessment New pt currently  being tx'd w/ Amox and Flonase.  no obvious infxn on PE.  reassured pt that Amox would adequately treat OM, sinus infxn, strep, bronchitis.  needs to give abx more time.  reviewed supportive care and red flags that should prompt return.  Pt expresses understanding and is in agreement w/ this plan.  Complete Medication List: 1)  Valtrex 500 Mg Tabs (Valacyclovir hcl) .... Take one tablet by mouth daily 2)  Benicar Hct 40-25 Mg Tabs (Olmesartan medoxomil-hctz) .Marland Kitchen.. 1 by mouth once daily 3)  Lateral J Brace  .... Use during activity 4)  Prometrium 200 Mg Caps (Progesterone micronized) .... Take one tablet at bedtime x10 days  Patient Instructions: 1)  Follow up as needed 2)  You are being treated appropriately for all things upper respiratory- please give the antibiotics time to work 3)  Continue to use the [employer] daily 4)  Add Coricidin HBP (decongestant) to help with your ear symptoms 5)  Consider adding Claritin or Zyrtec if you're unhappy w/ the [place] 6)  Tylenol/Ibuprofen as needed 7)  Drink plenty of fluids 8)  Hang in there!!

## 2010-04-03 NOTE — Letter (Signed)
Summary: [facility]  [facility]   Imported By: Sherian Rein 08/28/2009 15:51:17  _____________________________________________________________________  External Attachment:    Type:   Image     Comment:   External Document

## 2010-04-03 NOTE — Assessment & Plan Note (Signed)
Summary: blood in stool/questions about COL...as.   History of Present Illness Visit Type: Follow-up Visit Primary GI MD: Elie Goody MD [facility] Primary Provider: Neena Rhymes, MD Chief Complaint: blood in stool and questions regarding colonoscopy History of Present Illness:   This is a return office visit for small-volume hematochezia. She had several questions regarding her colonoscopy and the findings. All findings at colonoscopy were reviewed with her in detail. She has noted one episode with small amount of bleeding since her colonoscopy. She occasionally had noted mild stranding and larger stools, which seems to be the times she has noted small amounts of bleeding. She is taking a weight loss medication that contains senna.    GI Review of Systems      Denies abdominal pain, acid reflux, belching, bloating, chest pain, dysphagia with liquids, dysphagia with solids, heartburn, loss of appetite, nausea, vomiting, vomiting blood, weight loss, and  weight gain.      Reports rectal bleeding.     Denies anal fissure, black tarry stools, change in bowel habit, constipation, diarrhea, diverticulosis, fecal incontinence, heme positive stool, hemorrhoids, irritable bowel syndrome, jaundice, light color stool, liver problems, and  rectal pain.   Current Medications (verified): 1)  Valtrex 500 Mg Tabs (Valacyclovir Hcl) .... Take One Tablet By Mouth Daily 2)  Benicar Hct 40-25 Mg  Tabs (Olmesartan Medoxomil-Hctz) .Marland Kitchen.. 1 By Mouth Once Daily 3)  Lateral J Brace .... Use During Activity 4)  Prometrium 200 Mg Caps (Progesterone Micronized) .... Take One Tablet At Bedtime X10 Days  Allergies (verified): 1)  ! * Latex 2)  ! Adhesive Tape  Past History:  Past Medical History: GENITAL HERPES, HX OF (ICD-V13.8) KNEE PAIN, RIGHT (ICD-719.46) VAGINITIS (ICD-616.10) ACUTE BRONCHITIS (ICD-466.0) OBESITY, MORBID (ICD-278.01) HYPERTENSION (ICD-401.9) CHEST PAIN (ICD-786.50) LUMP OR MASS IN  BREAST (ICD-611.72) VITILIGO (ICD-709.01) INSOMNIA (ICD-780.52) HYPERLIPIDEMIA (ICD-272.4) DEPRESSION (ICD-311) HYPERPLASTIC COLON POLYPS MELANOSIS COLI  Past Surgical History: Reviewed history from 03/27/2009 and no changes required. none per pt   Family History: Reviewed history from 04/01/2009 and no changes required. Family History Diabetes 1st degree relative: Sister Mom dx'd w/ breast cancer  ~60 yrs of age sister with sleep apnea allergies: father (shellfish) clotting disorders: grandmother (stroke) cancer: mother (breast)  Family History of Colon Cancer: PGF-Lung Cancer MGM-Stroke  Social History: Reviewed history from 03/27/2009 and no changes required. Single with  no children Drug use-no Occupation: customer service pt lives with a female roomate. Never Smoked Alcohol use-yes Regular exercise-no  Review of Systems       The pertinent positives and negatives are noted as above and in the HPI. All other ROS were reviewed and were negative.   Vital Signs:  Patient profile:   35 year old female Height:      64 inches Weight:      283 pounds BMI:     48.75 Pulse rate:   80 / minute Pulse rhythm:   regular BP sitting:   118 / 78  (left arm)  Vitals Entered By: Milford Cage NCMA (May 23, 2009 10:56 AM)  Physical Exam  General:  Well developed, well nourished, no acute distress. obese.  No additional exam performed today.  Impression & Recommendations:  Problem # 1:  BLOOD IN STOOL (ICD-578.1) See history of present illness. All the findings at colonoscopy were reviewed with her in detail. Hyperplastic polyps do not require any specific followup and do not have malignant potential. No specific findings were noted to explain the small-volume hematochezia or  Hemoccult positive stool. She is advised to discontinue the use of senna and other laxative products. Increase daily, fiber and fluid intake. If she has any further gastrointestinal complaints, she will  return for followup otherwise, she will have ongoing care with her primary physician. I spent 25 minutes of face-to-face time with the majority of the time devoted to patient counseling.  Patient Instructions: 1)  Please continue current medications.  2)  Please schedule a follow-up appointment as needed.  3)  Copy sent to : Neena Rhymes, MD 4)  The medication list was reviewed and reconciled.  All changed / newly prescribed medications were explained.  A complete medication list was provided to the patient / caregiver.

## 2010-04-03 NOTE — Miscellaneous (Signed)
Summary: Appointment Canceled  Appointment status changed to canceled by [employer] on 12/09/2009 1:33 PM.  Cancellation Comments --------------------- echo/edema/hx of Obstructive sleep apnea  Appointment Information ----------------------- Appt Type:  CARDIOLOGY ANCILLARY VISIT      Date:  Tuesday, December 10, 2009      Time:  11:30 AM for 60 min   Urgency:  Routine   Made By:  Pearson Grippe  To Visit:  [id number]    Reason:  echo/edema/hx of Obstructive sleep apnea  Appt Comments ------------- -- 12/09/09 13:33: ([facility]) CANCELED -- echo/edema/hx of Obstructive sleep apnea -- 11/29/09 15:09: ([facility]) BOOKED -- Routine CARDIOLOGY ANCILLARY VISIT at 12/10/2009 11:30 AM for 60 min echo/edema/hx of Obstructive sleep apnea

## 2010-04-03 NOTE — Assessment & Plan Note (Addendum)
Summary: rov for osa   Visit Type:  follow up Copy to:  Neena Rhymes, MD Primary Provider/Referring Provider:  Neena Rhymes, MD  CC:  follow up. pt states she has not used her cpap in over a month due to pt states she has been "lazy". Pt states she would like to know why she is having numbness on her right side of her face and lips x1 month.  History of Present Illness: the pt comes in today for f/u of her known osa.  She is on cpap, but tells me that she has not worn the last one month.  She states that she has not been motivated to do so, and denies any issues with mask fit or pressure.  She takes full responsibility for her noncompliance.  She does not believe there is any adjustment to be made to increase her compliance with the device.  Current Medications (verified): 1)  Valtrex 500 Mg Tabs (Valacyclovir Hcl) .... Take One Tablet By Mouth Daily 2)  Benicar Hct 40-25 Mg  Tabs (Olmesartan Medoxomil-Hctz) .Marland Kitchen.. 1 By Mouth Once Daily 3)  Prometrium 200 Mg Caps (Progesterone Micronized) .... Take One Tablet At Bedtime X10 Days Each Month.  Allergies (verified): 1)  ! * Latex 2)  ! Adhesive Tape  Review of Systems       The patient complains of chest pain, acid heartburn, hand/feet swelling, and joint stiffness or pain.  The patient denies shortness of breath with activity, shortness of breath at rest, productive cough, non-productive cough, coughing up blood, indigestion, loss of appetite, weight change, abdominal pain, difficulty swallowing, sore throat, tooth/dental problems, headaches, nasal congestion/difficulty breathing through nose, sneezing, itching, ear ache, anxiety, depression, rash, change in color of mucus, and fever.    Vital Signs:  Patient profile:   35 year old female Height:      64 inches Weight:      315.38 pounds BMI:     54.33 O2 Sat:      97 % on Room air Temp:     97.8 degrees F oral Pulse rate:   61 / minute BP sitting:   116 / 70  (left arm) Cuff  size:   large  Vitals Entered By: Carver Fila (December 13, 2009 2:26 PM)  O2 Flow:  Room air CC: follow up. pt states she has not used her cpap in over a month due to pt states she has been "lazy". Pt states she would like to know why she is having numbness on her right side of her face and lips x1 month Comments meds and allergies updated Phone number updated Carver Fila  December 13, 2009 2:27 PM    Physical Exam  General:  obese female in nad Nose:  no skin breakdown or pressure necrosis from cpap mask Extremities:  mild edema, no cyanosis  Neurologic:  alert and oriented, moves all 4.   Impression & Recommendations:  Problem # 1:  OBSTRUCTIVE SLEEP APNEA (ICD-327.23) the pt is not using cpap compliantly due to her lack of desire to do so.  She readily admits this.  I have again stressed to her the importance of wearing her cpap, and working on weight loss.  Just to be sure she is getting the appropriate pressure, will re-optimize on auto mode for the next 2 weeks.  Other Orders: Est. Patient Level III ([address]) DME Referral (DME)  Patient Instructions: 1)  work on improving compliance, and also weight loss 2)  will have your machine  put on automatic mode for 2 weeks to optimize your pressure. 3)  followup with me in one year if doing well.    Appended Document: rov for osa megan, find out how pt is doing with cpap, and if her machine ever got changed back over to set pressure...she needs 13cm.  Appended Document: rov for osa [id number]  Appended Document: rov for osa see phone note from 04/24/2010 for complete details

## 2010-04-03 NOTE — Progress Notes (Signed)
Summary: prometrium refill   Phone Note Refill Request Message from:  [facility] [address] Fax #: [phone]  Refills Requested: Medication #1:  Prometrium 200mg  capsule, take 1 capsule at bedtime for 10 days   Dosage confirmed as above?Dosage Confirmed   Last Refilled: 01/18/2008 Next Appointment Scheduled: none with tabori Initial call taken by: Harold Barban,  May 17, 2009 1:12 PM    New/Updated Medications: PROMETRIUM 200 MG CAPS (PROGESTERONE MICRONIZED) take one tablet at bedtime x10 days Prescriptions: PROMETRIUM 200 MG CAPS (PROGESTERONE MICRONIZED) take one tablet at bedtime x10 days  #10 x 3   Entered by:   Doristine Devoid   Authorized by:   Neena Rhymes MD   Signed by:   Doristine Devoid on 05/17/2009   Method used:   [facility]  [phone]* (retail)       [address] [place], [place]  [address]       Ph: [phone] or [phone]       Fax: [phone]   RxID:   [id number]

## 2010-04-03 NOTE — Assessment & Plan Note (Signed)
Summary: consult for osa, insomnia   Copy to:  Beverely Low Primary Provider/Referring Provider:  Neena Rhymes MD  CC:  Sleep Consult.  History of Present Illness: The pt is a 35y/o female who comes in today for evaluation of sleeping issues.  She has been noted to have snoring, but no one has commented on pauses in her breathing during sleep.  She sleeps alone, but admits that she often will "startle awake" for no reason.  She always feels that she is aware of her surroundings while sleeping.  She typically goes to bed btw 24mn-2am, and arises at 7am to start her day.  It may take her an hour to fall asleep, and she is never rested upon arising.  She is very sleepy during the day while at work, and can doze in front of the computer.  She dozes a lot while watching tv in the evening, and has some sleepiness with driving.  Her epworth score today is very high at 22.  Her weight has increased 20 pounds over the last 2 years.  Medications Prior to Update: 1)  Valtrex 500 Mg Tabs (Valacyclovir Hcl) .... Take One Tablet By Mouth Daily 2)  Benicar Hct 40-25 Mg  Tabs (Olmesartan Medoxomil-Hctz) .Marland Kitchen.. 1 By Mouth Once Daily 3)  Lateral J Brace .... Use During Activity 4)  Prilosec Otc 20 Mg Tbec (Omeprazole Magnesium) .... One Tablet By Mouth Daily  Allergies (verified): 1)  ! * Latex 2)  ! 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) KNEE PAIN, RIGHT (ICD-719.46) OTHER SPECIFIED DISORDER OF RECTUM AND ANUS (ICD-569.49) VAGINITIS (ICD-616.10) RECTAL BLEEDING (ICD-569.3) ACUTE BRONCHITIS (ICD-466.0) HEADACHE (ICD-784.0) OBESITY, MORBID (ICD-278.01) ABDOMINAL CRAMPS (ICD-789.00) HYPERTENSION (ICD-401.9) CHEST PAIN (ICD-786.50) FAMILY HISTORY DIABETES 1ST DEGREE RELATIVE (ICD-V18.0) LUMP OR MASS IN BREAST (ICD-611.72) VITILIGO (ICD-709.01) INSOMNIA (ICD-780.52) HYPERLIPIDEMIA (ICD-272.4) DEPRESSION (ICD-311) ANEMIA-NOS (ICD-285.9)  Past Surgical  History: none per pt   Family History: Reviewed history from 12/07/2007 and no changes required. Family History Diabetes 1st degree relative Mom dx'd w/ breast cancer  ~60 yrs of age sister with sleep apnea  allergies: father (shellfish) clotting disorders: grandmother (stroke) cancer: mother (breast)   Social History: Reviewed history from 01/29/2009 and no changes required. Single with  no children Drug use-no Occupation: customer service pt lives with a female roomate. Never Smoked Alcohol use-yes Regular exercise-no  Review of Systems       The patient complains of shortness of breath with activity, productive cough, non-productive cough, chest pain, sore throat, anxiety, and depression.  The patient denies shortness of breath at rest, coughing up blood, irregular heartbeats, acid heartburn, indigestion, loss of appetite, weight change, abdominal pain, difficulty swallowing, tooth/dental problems, headaches, nasal congestion/difficulty breathing through nose, sneezing, itching, ear ache, hand/feet swelling, joint stiffness or pain, rash, change in color of mucus, and fever.    Vital Signs:  Patient profile:   35 year old female Height:      65 inches Weight:      303.25 pounds BMI:     50.65 O2 Sat:      95 % on Room air Temp:     98.1 degrees F oral Pulse rate:   67 / minute BP sitting:   122 / 84  (left arm) Cuff size:   large  Vitals Entered By: Arman Filter LPN (March 27, 2009 10:43 AM)  O2 Flow:  Room air CC: Sleep Consult Comments Medications reviewed with patient Arman Filter LPN  March 27, 2009 10:43 AM    Physical Exam  General:  obese female in nad Eyes:  PERRLA and EOMI.   Nose:  mild deviation to left, but patent Mouth:  large tonsils, elongation of soft palate and uvula, small posterior pharyngeal space Neck:  no jvd, tmg, LN Lungs:  clear to auscultation Heart:  rrr, no mrg. Abdomen:  soft and nontender, bs+ Extremities:  minimal ankle  edema, no cyanosis pulses intact distally Neurologic:  alert and oriented, moves all 4.   Impression & Recommendations:  Problem # 1:  OBSTRUCTIVE SLEEP APNEA (ICD-327.23) the pt's history, obesity, and abnormal upper airway anatomy are all suggestive of osa.  I have had a long discussion with her about sleep apnea, including its impact on her QOL and CV health.  I think there is enough suspicion and concern to evaluate with a sleep study.  The pt agrees.  Problem # 2:  PERSISTENT DISORDER INITIATING/MAINTAINING SLEEP (ICD-307.42) I suspect the pt is sleeping more than she realizes, and is having microsleep with sudden arousals from sleep apnea?  That said, I also think she has sleep hygeine issues that can contribute to her sleeping problems.  I have asked her not to read or watch tv in bed, and also to not doze in evening with tv or take catnaps.  I also discussed with her stimulus control therapy that may help as well.  Other Orders: Sleep Disorder Referral (Sleep Disorder) Consultation Level IV ([address])  Patient Instructions: 1)  will schedule for sleep study.  I will arrange followup once results are available. 2)  do not nap/doze during day, no reading or watching tv in bed 3)  If cannot get to sleep in , leave bedroom and watch tv or read until sleepy again.  Do this as many times as needed until you finally fall asleep. 4)  work on weight loss.

## 2010-04-03 NOTE — Assessment & Plan Note (Signed)
Summary: rov for osa   Copy to:  Neena Rhymes, MD Primary Provider/Referring Provider:  Neena Rhymes, MD  CC:  Darlene [name].  The patient says she is doing well on CPAP. No complaints or problems today. She says she is wearing the CPAP every night and uses for 4 to 10 hours. The amount of time depends on what time she goes to bed..  History of Present Illness: the pt comes in today for f/u of her osa.  She was started on cpap the last visit, and has been compliant with therapy.  She sees a definite difference when wearing cpap, and that she no longer falls asleep at work.  She is sleeping much better thru the night.  Denies any mask or pressure issue, but is considering trying nasal pillows due to her pillow pushing the full face around during the night.  Current Medications (verified): 1)  Valtrex 500 Mg Tabs (Valacyclovir Hcl) .... Take One Tablet By Mouth Daily 2)  Benicar Hct 40-25 Mg  Tabs (Olmesartan Medoxomil-Hctz) .Marland Kitchen.. 1 By Mouth Once Daily 3)  Lateral J Brace .... Use During Activity 4)  Prometrium 200 Mg Caps (Progesterone Micronized) .... Take One Tablet At Bedtime X10 Days  Allergies (verified): 1)  ! * Latex 2)  ! Adhesive Tape  Review of Systems      See HPI  Vital Signs:  Patient profile:   35 year old female Height:      64 inches (162.56 cm) Weight:      289 pounds (131.36 kg) BMI:     49.79 O2 Sat:      97 % on Room air Temp:     98.2 degrees F (36.78 degrees C) oral Pulse rate:   80 / minute BP sitting:   124 / 78  (left arm) Cuff size:   large  Vitals Entered By: Michel Bickers CMA (June 03, 2009 3:19 PM)  O2 Sat at Rest %:  97 O2 Flow:  Room air  Physical Exam  General:  obese female in nad Nose:  no skin breakdown or pressure necrosis from cpap mask Neurologic:  alert, not sleepy, moves all 4.   Impression & Recommendations:  Problem # 1:  OBSTRUCTIVE SLEEP APNEA (ICD-327.23) the pt is doing very well with cpap, and has had a positive clinical  response.  At this point, she will need pressure optimization, and will do this at home on auto-titration mode.  I will let her know the results of the download after 2 weeks.  I have asked her to work aggressively on weight loss, and to call if having issues with mask or pressure.  I will see her again in 6mos.  Other Orders: Est. Patient Level III ([address]) DME Referral (DME)  Patient Instructions: 1)  will get cpap pressure adjusted for you.  Will call you with results. 2)  work hard on weight loss. 3)  followup with me in 6mos.

## 2010-04-03 NOTE — Miscellaneous (Signed)
Summary: poor compliance on download   Clinical Lists Changes  download in may with very poor compliance

## 2010-04-03 NOTE — Letter (Signed)
Summary: Patient Notice-Hyperplastic Polyps  [facility]  [address] [place], [place] [address]   Phone: [phone]  Fax: [phone]        April 17, 2009 MRN: [id number]    [facility] [address] [place], [place]  [address]    Dear Ms. Bagheri,  I am pleased to inform you that the colon polyp(s) removed during your recent colonoscopy was (were) found to be hyperplastic. These types of polyps are NOT pre-cancerous.  It is therefore my recommendation that you have a repeat colonoscopy examination at age 72 for routine colorectal cancer screening.  Should you develop new or worsening symptoms of abdominal pain, bowel habit changes or bleeding from the rectum or bowels, please schedule an evaluation with either your primary care physician or with me.  Continue treatment plan as outlined the day of your exam.  Please call us if you are having persistent problems or have questions about your condition that have not been fully answered at this time.  Sincerely,  Meryl Dare MD [facility] his letter has been electronically signed by your physician.  Appended Document: Patient Notice-Hyperplastic Polyps Letter mailed 2.18.11

## 2010-04-03 NOTE — Progress Notes (Signed)
Summary: ? re bm   Phone Note Call from Patient   Caller: Patient Call For: Russella Dar Reason for Call: Refill Medication, Talk to Nurse Summary of Call: Patient wants to know if is normal that she still has not had a bm (had proc on Friday) Initial call taken by: Tawni Levy,  April 15, 2009 1:33 PM  Follow-up for Phone Call        CALLED PT. REGARDING TELEPHONE CALL REGARDING NO BM SINCE FRIDAY PROCEDURE.PT. STATING SHE DID HAVE A BM  ON 04/15/09 IN THE AFTERNOON. PT. STATING IT WAS SMALL BUT OF NORMAL APPEARANCE. PT. DENIES ABD. PAIN,DISTENTION OR BLEEDING. PT. THEN QUESTIONED WHAT CAUSES POLYPS. REFERRED PT. TO DC EDUCATIONAL POLYP SHEET GIVEN ON DC. PT. QUESTIONING WHEN  SHE WILL HEAR FROM PATHOLOGY OF HER POLYP. INFORMED IN ONE TO TWO WEEKS. PT. QUESTIONING WHY MD WANTS HER ON A HIGH FIBER DIET. REFERRED PT. TO ENDOSCOPY REPORT GIVEN ON DC AND MD DOCUMENTATION OF DX AND TREATMENT. DISCUSSED HIGH FIBER IS BETTER FOR COLON HEALTH. NO FURTHER QUESTIONS PER PT. INFORMED TO CALL WITH ANY ADDITIONAL CONCERNS. Follow-up by: Weston Brass RN,BSN

## 2010-04-03 NOTE — Progress Notes (Signed)
Summary: valtrex refill   Phone Note Refill Request Message from:  Fax from Pharmacy on October 17, 2009 9:00 AM  Refills Requested: Medication #1:  VALTREX 500 MG TABS Take one tablet by mouth daily [facility] - fax [phone]  Initial call taken by: Okey Regal [place],  October 17, 2009 9:00 AM    Prescriptions: VALTREX 500 MG TABS (VALACYCLOVIR HCL) Take one tablet by mouth daily  #30 x 6   Entered by:   Doristine Devoid CMA   Authorized by:   Neena Rhymes MD   Signed by:   Doristine Devoid CMA on 10/17/2009   Method used:   Electronically to        [id number] ([facility]ail)       [address] [place], [place]  [address]       Ph: [phone] or [phone]       Fax: [phone]   RxID:   [id number]

## 2010-04-03 NOTE — Letter (Signed)
Summary: CMN for CPAP Supplies/[facility]  CMN for CPAP Supplies/[facility]   Imported By: Sherian Rein 06/07/2009 14:53:45  _____________________________________________________________________  External Attachment:    Type:   Image     Comment:   External Document

## 2010-04-03 NOTE — Letter (Signed)
Summary: New Patient letter  [facility]  [address] [place], [place] [address]   Phone: [phone]  Fax: [phone]       03/13/2009 MRN: [id number]  [facility] [address] [place], [place]  [address]  Dear Ms. Bess,  Welcome to the Gastroenterology Division at [facility].    You are scheduled to see Dr. Russella Dar on 04-01-09 at 1:45p.m. on the 3rd floor at [facility], [address]  We ask that you try to arrive at our office 15 minutes prior to your appointment time to allow for check-in.  We would like you to complete the enclosed self-administered evaluation form prior to your visit and bring it with you on the day of your appointment.  We will review it with you.  Also, please bring a complete list of all your medications or, if you prefer, bring the medication bottles and we will list them.  Please bring your insurance card so that we may make a copy of it.  If your insurance requires a referral to see a specialist, please bring your referral form from your primary care physician.  Co-payments are due at the time of your visit and may be paid by cash, check or credit card.     Your office visit will consist of a consult with your physician (includes a physical exam), any laboratory testing he/she may order, scheduling of any necessary diagnostic testing (e.g. x-ray, ultrasound, CT-scan), and scheduling of a procedure (e.g. Endoscopy, Colonoscopy) if required.  Please allow enough time on your schedule to allow for any/all of these possibilities.    If you cannot keep your appointment, please call [phone] to cancel or reschedule prior to your appointment date.  This allows [place] the opportunity to schedule an appointment for another patient in need of care.  If you do not cancel or reschedule by 5 p.m. the business day prior to your appointment date, you will be charged a $50.00 late cancellation/no-show fee.    Thank you for choosing  [facility] for your medical needs.  We appreciate the opportunity to care for you.  Please visit [place] at our website  to learn more about our practice.                     Sincerely,                                                             The Gastroenterology Division

## 2010-04-03 NOTE — Letter (Signed)
Summary: [facility] Instructions  [facility]  [address] [place], [place] [address]   Phone: [phone]  Fax: [phone]       Darlene [name]    08/04/1975    MRN: [id number]        Procedure Day /Date: Friday February 11th, 2011     Arrival Time: 1:30pm     Procedure Time: 2:30pm     Location of Procedure:                    _x_  [facility] (4th Floor)                        PREPARATION FOR COLONOSCOPY WITH MOVIPREP   Starting 5 days prior to your procedure  04/07/09 do not eat nuts, seeds, popcorn, corn, beans, peas,  salads, or any raw vegetables.  Do not take any fiber supplements (e.g. Metamucil, Citrucel, and Benefiber).  THE DAY BEFORE YOUR PROCEDURE         DATE:  04/11/09   DAY:  Thursday   1.  Drink clear liquids the entire day-NO SOLID FOOD  2.  Do not drink anything colored red or purple.  Avoid juices with pulp.  No orange juice.  3.  Drink at least 64 oz. (8 glasses) of fluid/clear liquids during the day to prevent dehydration and help the prep work efficiently.  CLEAR LIQUIDS INCLUDE: Water Jello Ice Popsicles Tea (sugar ok, no milk/cream) Powdered fruit flavored drinks Coffee (sugar ok, no milk/cream) Gatorade Juice: apple, white grape, white cranberry  Lemonade Clear bullion, consomm, broth Carbonated beverages (any kind) Strained chicken noodle soup Hard Candy                             4.  In the morning, mix first dose of MoviPrep solution:    Empty 1 Pouch A and 1 Pouch B into the disposable container    Add lukewarm drinking water to the top line of the container. Mix to dissolve    Refrigerate (mixed solution should be used within 24 hrs)  5.  Begin drinking the prep at 5:00 p.m. The MoviPrep container is divided by 4 marks.   Every 15 minutes drink the solution down to the next mark (approximately 8 oz) until the full liter is complete.   6.  Follow completed prep with 16 oz of clear liquid of your  choice (Nothing red or purple).  Continue to drink clear liquids until bedtime.  7.  Before going to bed, mix second dose of MoviPrep solution:    Empty 1 Pouch A and 1 Pouch B into the disposable container    Add lukewarm drinking water to the top line of the container. Mix to dissolve    Refrigerate  THE DAY OF YOUR PROCEDURE      DATE:  04/12/09 DAY: Friday   Beginning at  9:30 a.m. (5 hours before procedure):         1. Every 15 minutes, drink the solution down to the next mark (approx 8 oz) until the full liter is complete.  2. Follow completed prep with 16 oz. of clear liquid of your choice.    3. You may drink clear liquids until  12:30pm  (2 HOURS BEFORE PROCEDURE).   MEDICATION INSTRUCTIONS  Unless otherwise instructed, you should take regular prescription medications with a small sip of water  as early as possible the morning of your procedure.           OTHER INSTRUCTIONS  You will need a responsible adult at least 36 years of age to accompany you and drive you home.   This person must remain in the waiting room during your procedure.  Wear loose fitting clothing that is easily removed.  Leave jewelry and other valuables at home.  However, you may wish to bring a book to read or  an iPod/MP3 player to listen to music as you wait for your procedure to start.  Remove all body piercing jewelry and leave at home.  Total time from sign-in until discharge is approximately 2-3 hours.  You should go home directly after your procedure and rest.  You can resume normal activities the  day after your procedure.  The day of your procedure you should not:   Drive   Make legal decisions   Operate machinery   Drink alcohol   Return to work  You will receive specific instructions about eating, activities and medications before you leave.    The above instructions have been reviewed and explained to me by   [name].     I fully understand and can verbalize  these instructions _____________________________ Date _________

## 2010-04-03 NOTE — Progress Notes (Signed)
  Phone Note Outgoing Call   Call placed by: Lemont Fillers FNP,  November 29, 2009 10:32 AM Call placed to: Patient Summary of Call: Called patient, reviewed neg head ct results.  (this was done last night). Initial call taken by: Lemont Fillers FNP,  November 29, 2009 10:32 AM

## 2010-04-03 NOTE — Progress Notes (Signed)
Summary: refill on meds   Phone Note Call from Patient   Caller: Patient Summary of Call: patient called wanted to get samples for meds due to change in insurance for 2 week supply informed patient that I can give samples for benicar but since valtrex is generic we no longer have samples prescription for valtrex sent to [facility] [address]........Marland KitchenDoristine Devoid  March 18, 2009 2:28 PM     Prescriptions: VALTREX 500 MG TABS (VALACYCLOVIR HCL) Take one tablet by mouth daily  #30 x 6   Entered by:   Doristine Devoid   Authorized by:   Neena Rhymes MD   Signed by:   Doristine Devoid on 03/18/2009   Method used:   Electronically to        [facility]  [id number]* (retail)       [address]       [place], [place]  [address]       Ph: [phone] or [phone]       Fax: [phone]   RxID:   [id number]

## 2010-04-03 NOTE — Procedures (Signed)
Summary: Colonoscopy  Patient: [facility] Note: All result statuses are Final unless otherwise noted.  Tests: (1) Colonoscopy (COL)   COL Colonoscopy           DONE     [facility]     [address]     [place], [place]  [address]           COLONOSCOPY PROCEDURE REPORT           PATIENT:  Darlene [name], Darlene [name]  MR#:  [id number]     BIRTHDATE:  1975/05/28, 33 yrs. old  GENDER:  female           ENDOSCOPIST:  Judie Petit T. Russella Dar, MD, [facility]           PROCEDURE DATE:  04/12/2009     PROCEDURE:  Colonoscopy with snare polypectomy, and with hot     biopsy     ASA CLASS:  Class II     INDICATIONS:  1) hematochezia           MEDICATIONS:   Fentanyl 75 mcg IV, Versed 8 mg IV, Benadryl 25 mg     IV           DESCRIPTION OF PROCEDURE:   After the risks benefits and     alternatives of the procedure were thoroughly explained, informed     consent was obtained.  Digital rectal exam was performed and     revealed no abnormalities.   The LB [id number] endoscope     was introduced through the anus and advanced to the cecum, which     was identified by both the appendix and ileocecal valve, without     limitations.  The quality of the prep was excellent, using     MoviPrep.  The instrument was then slowly withdrawn as the colon     was fully examined.     <<PROCEDUREIMAGES>>           FINDINGS:  A sessile polyp was found in the proximal transverse     colon. It was 5 mm in size. Polyp was snared without cautery.     Retrieval was successful. Three polyps were found in the rectum.     They were 3 - 4 mm in size. With hot biopsy forceps the polyps     were cauterized, biopsies were obtained and sent to pathology.     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 It was mild and     diffuse. This was otherwise a normal examination of the colon.     Retroflexed views in the rectum revealed no abnormalities. The     time to cecum =  3.5  minutes. The scope was then withdrawn (time     =  9.25  min) from the patient and the procedure completed.           COMPLICATIONS:  None           ENDOSCOPIC IMPRESSION:     1) 5 mm sessile polyp in the proximal transverse colon     2) 3 - 4 mm, three polyps in the rectum     3) Melanosis throughout the colon           RECOMMENDATIONS:     1) No aspirin or NSAID's for 2 weeks     2) Await pathology results     3) High fiber diet     4) If the polyps removed today are adenomatous (pre-cancerous),  you will need a repeat colonoscopy in 5 years. Otherwise you     should continue to follow colorectal cancer screening guidelines     for "routine risk" patients with colonoscopy in 10 years.     Venita Lick. Russella Dar, MD, [facility]           CC: Sheliah Hatch, MD           n.     [name] at 04/12/2009 02:40 PM           [name], [name], [phone]  Note: An exclamation mark (!) indicates a result that was not dispersed into the flowsheet. Document Creation Date: 04/12/2009 2:41 PM _______________________________________________________________________  (1) Order result status: Final Collection or observation date-time: 04/12/2009 14:34 Requested date-time:  Receipt date-time:  Reported date-time:  Referring Physician:   Ordering Physician: Claudette Head [phone]) Specimen Source:  Source: Launa Grill Order Number: [id number] Lab site:

## 2010-04-03 NOTE — Assessment & Plan Note (Signed)
Summary: BLOOD IN STOOL--CH   History of Present Illness Visit Type: Follow-up Visit Primary GI MD: Elie Goody MD [facility] Primary Provider: Neena Rhymes, MD Chief Complaint: blood in stool and when wiping  bright red/+ heme test  History of Present Illness:   This is a 35 year old female, who relates small amounts of bright red blood per rectum beginning in late 2009. She mainly noted blood on the tissue paper. Her symptoms abated until about 2-3 months ago, when she noted the onset, intermittent, bright red blood per rectum on the tissue paper in the commode and in the stools. She has a very mild brief twinge of rectal discomfort when passing a bowel movement on occasion. The symptoms last for only several seconds. She was found to have Hemoccult. She relates infrequent episodes of acid reflux and nocturnal reflux, primarily induced by certain foods, and, by eating late at night. The symptoms have improved with dietary and lifestyle adjustments.   GI Review of Systems    Reports acid reflux, bloating, and  nausea.      Denies abdominal pain, belching, chest pain, dysphagia with liquids, dysphagia with solids, heartburn, loss of appetite, vomiting, vomiting blood, weight loss, and  weight gain.      Reports rectal bleeding and  rectal pain.     Denies anal fissure, black tarry stools, change in bowel habit, constipation, diarrhea, diverticulosis, fecal incontinence, heme positive stool, hemorrhoids, irritable bowel syndrome, jaundice, light color stool, and  liver problems.   Current Medications (verified): 1)  Valtrex 500 Mg Tabs (Valacyclovir Hcl) .... Take One Tablet By Mouth Daily 2)  Benicar Hct 40-25 Mg  Tabs (Olmesartan Medoxomil-Hctz) .Marland Kitchen.. 1 By Mouth Once Daily 3)  Lateral J Brace .... Use During Activity  Allergies (verified): 1)  ! * Latex 2)  ! Adhesive Tape  Past History:  Past Medical History: GENITAL HERPES, HX OF (ICD-V13.8) KNEE PAIN, RIGHT  (ICD-719.46) VAGINITIS (ICD-616.10) ACUTE BRONCHITIS (ICD-466.0) OBESITY, MORBID (ICD-278.01) HYPERTENSION (ICD-401.9) CHEST PAIN (ICD-786.50) LUMP OR MASS IN BREAST (ICD-611.72) VITILIGO (ICD-709.01) INSOMNIA (ICD-780.52) HYPERLIPIDEMIA (ICD-272.4) DEPRESSION (ICD-311)  Past Surgical History: Reviewed history from 03/27/2009 and no changes required. none per pt   Family History: Reviewed history from 03/28/2009 and no changes required. Family History Diabetes 1st degree relative: Sister Mom dx'd w/ breast cancer  ~60 yrs of age sister with sleep apnea allergies: father (shellfish) clotting disorders: grandmother (stroke) cancer: mother (breast)  Family History of Colon Cancer: PGF-Lung Cancer MGM-Stroke  Social History: Reviewed history from 03/27/2009 and no changes required. Single with  no children Drug use-no Occupation: customer service pt lives with a female roomate. Never Smoked Alcohol use-yes Regular exercise-no  Review of Systems       The patient complains of allergy/sinus, anxiety-new, back pain, cough, coughing up blood, depression-new, fatigue, headaches-new, night sweats, sleeping problems, sore throat, urination - excessive, urination changes/pain, and urine leakage.         The pertinent positives and negatives are noted as above and in the HPI. All other ROS were reviewed and were negative.   Vital Signs:  Patient profile:   35 year old female Height:      64 inches Weight:      293.25 pounds BMI:     50.52 Pulse rate:   80 / minute Pulse rhythm:   regular BP sitting:   126 / 82  (left arm)  Vitals Entered By: Milford Cage NCMA (April 01, 2009 2:30 PM)  Physical Exam  General:  Well developed, well nourished, no acute distress. obese.   Head:  Normocephalic and atraumatic. Eyes:  PERRLA, no icterus. Ears:  Normal auditory acuity. Mouth:  No deformity or lesions, dentition normal. Lungs:  Clear throughout to auscultation. Heart:   Regular rate and rhythm; no murmurs, rubs,  or bruits. Abdomen:  Soft, nontender and nondistended. No masses, hepatosplenomegaly or hernias noted. Normal bowel sounds. Rectal:  Normal exam.deferred until time of colonoscopy.  Her exam on 03/11/09 showed no lesions and Hemoccult-positive stool. Psych:  Alert and cooperative. Normal mood and affect.  Impression & Recommendations:  Problem # 1:  BLOOD IN STOOL (ICD-578.1) Small amounts of blood in stool with Hemoccult positive stool on rectal exam. Rule out hemorrhoidal bleeding, IED, and, less likely colorectal neoplasms. The risks, benefits and alternatives to colonoscopy with possible biopsy, possible destruction of hemorrhoids and possible polypectomy were discussed with the patient and they consent to proceed. The procedure will be scheduled electively. Orders: Colonoscopy (Colon)  Problem # 2:  GERD (ICD-530.81) Infrequent reflux symptoms improved with dietary adjustment. She is given standard antireflux measures. If her symptoms persist, consider the regular use of an acid suppressant.  Patient Instructions: 1)  Colonoscopy brochure given.  2)  Conscious Sedation brochure given.  3)  Avoid foods high in acid content ( tomatoes, citrus juices, spicy foods) . Avoid eating within 3 to 4 hours of lying down or before exercising. Do not over eat; try smaller more frequent meals. Elevate head of bed four inches when sleeping.  4)  Copy sent to : Neena Rhymes, MD 5)  The medication list was reviewed and reconciled.  All changed / newly prescribed medications were explained.  A complete medication list was provided to the patient / caregiver.  Prescriptions: MOVIPREP 100 GM  SOLR (PEG-KCL-NACL-NASULF-NA ASC-C) As per prep instructions.  #1 x 0   Entered by:   Christie Nottingham CMA (AAMA)   Authorized by:   Meryl Dare MD [facility]   Signed by:   Meryl Dare MD [facility] on 04/01/2009   Method used:   [phone][facility]ail)       [address]       [place], [place]  [address]       Ph: [phone]       Fax: [phone]   RxID:   [id number]

## 2010-04-03 NOTE — Progress Notes (Signed)
Summary: need to sched ov with kc LMTCBX1-pt returned call  ---- Converted from flag ---- ---- 04/10/2009 3:58 PM, Arman Filter LPN wrote: pt needs ov with kc to discuss sleep study results. ------------------------------  LMOMTCBx1.....sign  Phone Note Call from Patient   Caller: Patient Call For: clance Summary of Call: pt returned call. i advised that we need to schedule a f/u with pt for sleep results. pt will call back when she gets home later today and schedule this.  Initial call taken by: Tivis Ringer, CNA,  April 11, 2009 1:32 PM

## 2010-04-03 NOTE — Assessment & Plan Note (Signed)
Summary: frequency to urinate/cbs--Rm 5   Vital Signs:  Patient profile:   35 year old female Height:      64 inches Weight:      310 pounds BMI:     53.40 Temp:     97.9 degrees F oral Pulse rate:   78 / minute Resp:     18 per minute  Vitals Entered By: Mervin Kung CMA Duncan Dull) (November 27, 2009 3:48 PM) CC: Rm 5  Pt states she has a hard time holding her urine and feels like she is not completely emptying her bladder. Had UTI in August. Is Patient Diabetic? No Comments Pt states she no longer uses the lateral J brace. Nicki Guadalajara Fergerson CMA Duncan Dull)  November 27, 2009 3:53 PM    Primary Care Odalys Win:  Neena Rhymes, MD  CC:  Rm 5  Pt states she has a hard time holding her urine and feels like she is not completely emptying her bladder. Had UTI in August..  History of Present Illness: Ms Paddock is a 35 year old female who presents with complaint of urinary frequency, polyuria, difficulty emptying bladder.  Denies nocturia.  Notes urinary leakage.  Denies dysuria or unusual back pain.  Denies fever, hematuria, or vomitting.  Does feel nauseas today.  LMP was several weeks   Patient also notes some swelling in her feet and leg- noticed on a daily basis.  Wakes up with this swelling.  Denies SOB.  Recently had some right sided lip tingling, and right hand/arm tingling and numbness.  She is unable to quantify exactly how long she has had these symptoms, however they at least started several days ago.  Denies tingling/weakness in arm today, but notes some sense of lip "fullness."  Denies associated slurred speech, facial droop, or visual changes.  Denies associated RUE/RLE weakness.    Allergies: 1)  ! * Latex 2)  ! Adhesive Tape  Past History:  Past Medical History: Last updated: 05/23/2009 GENITAL HERPES, HX OF (ICD-V13.8) KNEE PAIN, RIGHT (ICD-719.46) VAGINITIS (ICD-616.10) ACUTE BRONCHITIS (ICD-466.0) OBESITY, MORBID (ICD-278.01) HYPERTENSION (ICD-401.9) CHEST PAIN  (ICD-786.50) LUMP OR MASS IN BREAST (ICD-611.72) VITILIGO (ICD-709.01) INSOMNIA (ICD-780.52) HYPERLIPIDEMIA (ICD-272.4) DEPRESSION (ICD-311) HYPERPLASTIC COLON POLYPS MELANOSIS COLI  Past Surgical History: Last updated: 03/27/2009 none per pt   Review of Systems       see HPI  Physical Exam  General:  Morbidly obese AA female, awake,alert and in NAD Head:  Normocephalic and atraumatic without obvious abnormalities. No apparent alopecia or balding. Lungs:  Normal respiratory effort, chest expands symmetrically. Lungs are clear to auscultation, no crackles or wheezes. Heart:  Normal rate and regular rhythm. S1 and S2 normal without gallop, murmur, click, rub or other extra sounds. Neurologic:  alert & oriented X3, cranial nerves II-XII intact, strength normal in all extremities, and gait normal.   Psych:  Oriented X3 and normally interactive.     Impression & Recommendations:  Problem # 1:  POLYURIA (WUX-324.40) Assessment New UA is unremarkable.  Will send for culture.  Will also repeat glucose and check an A1C.  Review of previous lab draws note sugars up to 133, though this may have been a random sugar.  If w/u is negative, consider trial off of HCTZ versus med trial for overactive bladder. Orders: TLB-BMP (Basic Metabolic Panel-BMET) (80048-METABOL)  Problem # 2:  EDEMA (ICD-782.3) Assessment: New Will order 2-d Echo to evaluate cardiac function.  Will hold off on addition of furosemide at this time as it may exacerbate  her urinary symptoms and she is already on HCTZ.  [facility].   The following medications were removed from the medication list:    Furosemide 20 Mg Tabs (Furosemide) ..... One tablet by mouth once daily as needed for swelling Her updated medication list for this problem includes:    Benicar Hct 40-25 Mg Tabs (Olmesartan medoxomil-hctz) .Marland Kitchen... 1 by mouth once daily  Orders: TLB-Hepatic/Liver Function Pnl  (80076-HEPATIC)  Problem # 3:  NUMBNESS (ICD-782.0) Assessment: New Patient with several day history of right arm/right lip tingling.  Symptoms improved today.  My suspicion for CVA is low, however she does have some risk factors.  Will order CT head to further evaluate.   Orders: Misc. Referral (Misc. Ref)  Complete Medication List: 1)  Valtrex 500 Mg Tabs (Valacyclovir hcl) .... Take one tablet by mouth daily 2)  Benicar Hct 40-25 Mg Tabs (Olmesartan medoxomil-hctz) .Marland Kitchen.. 1 by mouth once daily 3)  Prometrium 200 Mg Caps (Progesterone micronized) .... Take one tablet at bedtime x10 days each month.  Other Orders: T-Hgb A1C ([address]45-40981) Specimen Handling (19147) UA Dipstick w/o Micro (manual) (81002) T-Culture, Urine (82956-21308) Urine Pregnancy Test  (65784)  Patient Instructions: 1)  You will be contacted about your echocardiogram to check your heart. 2)  Please complete your head CT downstairs today. 3)  Follow up in 2 weeks with Dr. Beverely Low 4)  Go to ER if you develop slurred speech, weakness on one side, facial drooping. Prescriptions: FUROSEMIDE 20 MG TABS (FUROSEMIDE) one tablet by mouth once daily as needed for swelling  #30 x 0   Entered and Authorized by:   Lemont Fillers FNP   Signed by:   Lemont Fillers FNP on 11/27/2009   Method used:   [facility]        [facility]  [phone]* (retail)       [address] [place], [place]  [address]       Ph: [phone] or [phone]       Fax: [phone]   RxID:   [id number]   Current Allergies (reviewed today): ! * LATEX ! ADHESIVE TAPE  Laboratory Results   Urine Tests   Date/Time Reported: Mervin Kung CMA Duncan Dull)  November 27, 2009 4:28 PM   Routine Urinalysis   Color: yellow Appearance: Clear Glucose: negative   (Normal Range: Negative) Bilirubin: negative   (Normal Range: Negative) Ketone: negative   (Normal Range: Negative) Spec. Gravity: 1.020   (Normal Range:  1.003-1.035) Blood: trace-intact   (Normal Range: Negative) pH: 6.0   (Normal Range: 5.0-8.0) Protein: negative   (Normal Range: Negative) Urobilinogen: 0.2   (Normal Range: 0-1) Nitrite: negative   (Normal Range: Negative) Leukocyte Esterace: negative   (Normal Range: Negative)    Urine HCG: negative Comments: Urine cultured. Nicki Guadalajara Fergerson CMA Duncan Dull)  November 27, 2009 4:29 PM

## 2010-04-03 NOTE — Assessment & Plan Note (Signed)
Summary: rov to review sleep study   Copy to:  Neena Rhymes, MD Primary Provider/Referring Provider:  Neena Rhymes, MD  CC:  Pt is here for a f/u appt to discuss sleep study results. .  History of Present Illness: The pt comes in today for f/u of her recent sleep study.  She was found to have an AHI of 14/hr, and desat as low as 84%.  Most of her apnea occurred during REM.  I have gone over the study with her in detail, and answered all of her questions.  Current Medications (verified): 1)  Valtrex 500 Mg Tabs (Valacyclovir Hcl) .... Take One Tablet By Mouth Daily 2)  Benicar Hct 40-25 Mg  Tabs (Olmesartan Medoxomil-Hctz) .Marland Kitchen.. 1 By Mouth Once Daily 3)  Lateral J Brace .... Use During Activity  Allergies (verified): 1)  ! * Latex 2)  ! Adhesive Tape  Vital Signs:  Patient profile:   35 year old female Height:      64 inches Weight:      294.13 pounds BMI:     50.67 O2 Sat:      99 % on Room air Temp:     98.1 degrees F oral Pulse rate:   70 / minute BP sitting:   126 / 84  (left arm) Cuff size:   large  Vitals Entered By: Arman Filter LPN (April 22, 2009 3:35 PM)  O2 Flow:  Room air CC: Pt is here for a f/u appt to discuss sleep study results.  Comments Medications reviewed with patient  Arman Filter LPN  April 22, 2009 3:38 PM    Physical Exam  General:  obese female in nad   Impression & Recommendations:  Problem # 1:  OBSTRUCTIVE SLEEP APNEA (ICD-327.23) the pt has mild osa by her recent sleep study, but has significant symptoms during the day and nonrestorative sleep.  I have reviewed the various treatment options for her osa, including a trial of weight loss alone, dental appliance, surgery, and cpap.  I really think cpap coupled with a weight loss program represents her best option.  She is willing to try this.  Will get set up on a moderate pressure level to allow for desensitization, and optimize her pressure after next visit.  Time spent with  pt today was .   Other Orders: Est. Patient Level III (04540) DME Referral (DME)  Patient Instructions: 1)  will set up on cpap as a trial. Please call if having issues with tolerance 2)  work on weight loss 3)  followup with me in 4wks.

## 2010-04-24 ENCOUNTER — Telehealth (INDEPENDENT_AMBULATORY_CARE_PROVIDER_SITE_OTHER): Payer: Self-pay | Admitting: *Deleted

## 2010-04-25 ENCOUNTER — Telehealth (INDEPENDENT_AMBULATORY_CARE_PROVIDER_SITE_OTHER): Payer: Self-pay | Admitting: *Deleted

## 2010-05-05 ENCOUNTER — Telehealth (INDEPENDENT_AMBULATORY_CARE_PROVIDER_SITE_OTHER): Payer: Self-pay | Admitting: *Deleted

## 2010-05-08 NOTE — Progress Notes (Signed)
Summary: Needs medication information   Phone Note Call from Patient Call back at Work Phone [phone]   Caller: Mom Call For: Darlene Rhymes MD Summary of Call: Patient sees another Doctor now and wants to know what strength was of the Paxil medication she took a few years ago. Initial call taken by: Barnie Mort,  April 25, 2010 4:11 PM  Follow-up for Phone Call        looking at previous information from paper chart patient was on Paxil 40mg   left message on machine .....Marland KitchenMarland KitchenDoristine Devoid CMA  April 28, 2010 8:23 AM   left message to call office..........Marland KitchenFelecia Deloach CMA  April 28, 2010 2:56 PM   Discuss with patient.......Marland KitchenFelecia Deloach CMA  April 28, 2010 4:31 PM

## 2010-05-08 NOTE — Progress Notes (Signed)
Summary: pt phoned stated taht she was returning a call to [facility]  Phone Note Call from Patient Call back at [facility] Phone [phone]   Caller: Patient Call For: clance Summary of Call: patient phoned stated that she was returning a call to [facility]. She can be reached at [phone] Initial call taken by: Vedia Coffer,  April 24, 2010 10:37 AM  Follow-up for Phone Call        [facility] wanted to know how she was doing on CPAP and if her pressure was set to 13.  She states that she thinks it is 13, but unsure.  She states that she does not feel that this is working for her.  She thinks that there is not enough pressure coming out.  Will forward to [facility].  Pls advise thanks Follow-up by: Vernie Murders,  April 24, 2010 11:38 AM  Additional Follow-up for Phone Call Additional follow up Details #1::        need to find out if her machine is set on 13.  If not, needs to be.  If it is already , let me know Additional Follow-up by: Barbaraann Share MD,  April 28, 2010 5:29 PM    Additional Follow-up for Phone Call Additional follow up Details #2::    lmomtcb for pt [address] [facility]  April 28, 2010 5:49 PM   LMOMTCB Vernie Murders  April 29, 2010 5:04 PM Vernie Murders  April 29, 2010 5:04 PM  [facility] Gweneth Dimitri RN  April 30, 2010 4:04 PM Patient phoned stated that she was returning a call to Triage. She can be reached at [phone].Vedia Coffer  May 01, 2010 1:35 PM    Additional Follow-up for Phone Call Additional follow up Details #3:: Details for Additional Follow-up Action Taken: Spoke with pt and advised that we need to know what her machine is set on.  She states that she is unsure, will check and call back. Vernie Murders  May 01, 2010 1:46 PM  [facility] x 2 Vernie Murders  May 02, 2010 9:07 AM  lmomtcb x3. Left message stating if she is still having issues to give [place] a call back and we would be more than happy to assist her with any  problems she may be having. Will sign off message Carver Fila  May 02, 2010 5:08 PM

## 2010-05-13 NOTE — Progress Notes (Signed)
Summary: cpap settings--awaiting pt call back  Phone Note Call from Patient Call back at Home Phone [phone]   Caller: Patient Call For: [facility] Summary of Call: Patient phoned stated that she was returning  a call from Triage regarding her CPAP and the settings on it. She can be reached at [phone] Initial call taken by: Vedia Coffer,  May 05, 2010 11:07 AM  Follow-up for Phone Call        lmomtcb to find out what her cpap is set on x1 [facility]  May 05, 2010 4:22 PM   Called and spoke with pt and she states she is not able to determine what her settings are on and wanted [place] to call her back in the morning to walk her through on how to read what her machine is set on. I advised pt that she shoud call her home care and get someone out their to see what she is set on. Pt states she was going to call them and get them to come to her home and check her machine out and call us back when she finds out what she is set on. Will hold on in triage until pt calls back  [facility]  May 06, 2010 4:19 PM   Additional Follow-up for Phone Call Additional follow up Details #1::        Will sign the msg and await a call back from the pt Vernie Murders  May 07, 2010 10:36 AM

## 2010-07-15 NOTE — Assessment & Plan Note (Signed)
[facility]                            CARDIOLOGY OFFICE NOTE   Darlene [name], Darlene [name]                      MRN:          [id number]  DATE:09/02/2006                            DOB:          [birth year]    Darlene [name] is seen today as a new patient.  She was referred by Dr.  Fabian Sharp from [facility].   The patient has coronary risk factors, including morbid central obesity  and hypertension.  She is a nonsmoker, non-diabetic and family history  is remarkable for diabetes, but not premature coronary disease.  She has  been having some heaviness in her chest for the past couple of weeks.  She relates it to possibly moving a lot of books that she was going to  bring to a store as a donation.   The pain is fleeting, described as a pressure.  It radiates to the left  side of her chest, but not to the back, arm or neck.  There is no  associated shortness of breath or diaphoresis.  The pain seems to be  actually improved over the last day or two.   She has no previously-documented coronary disease and has not had a  previous stress test.   There is no history of DVT or PE.  She does not have any significant  arthritides.   REVIEW OF SYSTEMS:  Otherwise unremarkable.   MEDICATIONS:  1. Benicar 40 a day.  2. YAZ birth control.  3. Valtrex, question dose.  4. A baby aspirin a day.   PAST MEDICAL HISTORY:  Remarkable for central obesity.  She has had  genital herpes, which she takes medication for.  This was diagnosed in  2006 and she has been on blood pressure medicine for about a year.  She  denies any previous surgeries.   FAMILY HISTORY:  Remarkable for diabetes, but not premature coronary  disease.   The patient is single.  She has family in [place].  She actually has  a fraternal twin sister, who has diabetes.  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   She is otherwise sedentary and does not drink or  smoke.   EXAM:  Her exam is remarkable for an overweight black female in no  distress.  Affect is appropriate.  Blood pressure is 130/76, pulse 72  and regular, respiratory rate is 14, she is afebrile, weight is 286.  HEENT:  Normal.  NECK:  The neck is supple.  There is no thyromegaly, no lymphadenopathy.  JVP is not visible.  There are no bruits.  LUNGS:  Clear with good diaphragmatic motion, no wheezing.  There is an S1, S2 with distant heart sounds, PMI is not palpable.  ABDOMEN:  Protuberant.  There is no tenderness.  Bowel sounds are  positive.  No organomegaly, no masses, no hepatosplenomegaly, no  hepatojugular reflux.  Distal pulses are intact, no edema.  NEUROLOGIC:  Nonfocal.  There is no muscular weakness.   Her baseline EKG is normal with a heart rate of 72.   I reviewed  her lab work that was in the system from April 23.  She did  have a mildly elevated glucose at 111.  Her LDL cholesterol was 110.  Her creatinine was 0.9 and she had negative cardiac markers.   IMPRESSION:  1. Atypical chest pain in an overweight, hypertensive black female.      The patient will be referred for a stress Myoview.  She will      continue a baby aspirin a day.  2. Hypertension.  Continue low-salt diet.  Decrease caloric intake.      The patient would not likely need to be on blood pressure medicine,      if she could lose weight.  For the time-being, we will continue her      Benicar at 40 a day.  3. Genital herpes.  Continue Valtrex.  I encouraged her to take it on      a regular basis, as opposed to p.r.n.  4. Probable borderline diabetic.  Check hemoglobin A1c.  She has a      fraternal twin who is a diabetic and she would appear to be a set-      up for type 2 diabetes.  It may be worthwhile for her to follow up      with Dr. Fabian Sharp for a glucose tolerance test.   I will see the patient back if her stress test is abnormal.  Otherwise,  she will follow up with Dr. Fabian Sharp.      Noralyn Pick. Eden Emms, MD, [facility]  Electronically Signed    PCN/MedQ  DD: 09/02/2006  DT: 09/03/2006  Job #: [id number]   cc:   Neta Mends. Fabian Sharp, MD

## 2010-07-18 ENCOUNTER — Telehealth: Payer: Self-pay | Admitting: *Deleted

## 2010-07-18 NOTE — Assessment & Plan Note (Signed)
[facility]                          [place] OFFICE NOTE   Darlene [name], Darlene [name]                      MRN:          [id number]  DATE:10/02/2005                            DOB:          Apr 13, 1975    REASON FOR VISIT:  Tightness in the leg.   Darlene [name] is a 35 year old female with no significant past medical history,  who presents today stating that she has been noticing tightness in her  entire leg into her feet for the last 2-3 weeks.  Usually, it is worse at  the end of the work day, which goes on to about 1:00 in the morning.  On  further questioning, Darlene [name] reports that over the same time period, she  had increased her work schedule to 80 hours a week.  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  She notices that by the end of  the work day she is tight in her legs.  She has not noticed any obvious  swelling.   On further questioning during review of systems, she did report one episode  of right-sided sharp chest pain.  She has had previous episodes which are  usually associated with movement of her shoulder.  The pain usually lasts a  few minutes to seconds.  She denies any shortness of breath or dyspnea on  exertion.   PAST MEDICAL HISTORY:  None.   MEDICATIONS:  None.   FAMILY HISTORY:  Father alive with a history of congenital heart disease.  Mother alive with no significant medical problems.  She has a twin sister  with a history of diabetes.   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  Drinks 1-2 alcoholic beverages a month.  Denies any tobacco use.   OBJECTIVE:  VITAL SIGNS:  Weight 291.  Pulse 72, blood pressure 142/100.  GENERAL:  We have a pleasant overweight female in no acute distress.  Answers questions appropriately.  Alert and oriented x3.  NECK:  Supple.  No lymphadenopathy, carotid bruits, or JVD.  No thyromegaly  was noted.  LUNGS:  Clear with  good air movement.  HEART:  Regular rate and rhythm.  Normal S1 and S2.  No murmurs, rubs or  gallops.  EXTREMITIES:  No clubbing, cyanosis or edema.  Good range of motion.  No  focal motor or sensory deficits were noted.  Pulses 2+ throughout.   IMPRESSION:  47.  A 35 year old female who recently increased her work schedule to about      80 hours per week and is sedentary throughout much of that time.  The      tightness she feels in her legs is most likely due to her sedentary      lifestyle, suddenly was  increased by an additional 40 hours a week.  2.  Atypical chest pain, infrequent.  EKG was unremarkable.   PLAN:  1.  Recommended patient take a break every 40-45 minutes to stretch out at      work for at least a minute or two.  I also recommended she may consider      decreasing her work hours and restart exercising, i.e., walking daily.  2.  We will obtain a comprehensive metabolic profile and a CPK.  3.  Advised the patient to keep a diary regarding the atypical, fleeting      chest discomfort.  4.  She is to follow up in 2-3 weeks to recheck blood pressure and her      clinical status.  5.  She is to follow up sooner if any other concerns.                                   Darlene Chang, MD   LA/MedQ  DD:  10/02/2005  DT:  10/02/2005  Job #:  [id number]

## 2010-07-18 NOTE — Telephone Encounter (Signed)
The office received fax from [facility] requesting refill of Prometrium 200mg  caps.  I see in [employer] on 04/25/10 that pt mom called and stated pt was seeing a new doctor.   I wrote on fax to contact the pt due to the above.

## 2010-12-12 ENCOUNTER — Ambulatory Visit: Payer: Self-pay | Admitting: Pulmonary Disease

## 2012-05-24 ENCOUNTER — Other Ambulatory Visit: Payer: Self-pay | Admitting: Internal Medicine

## 2012-05-24 DIAGNOSIS — Z803 Family history of malignant neoplasm of breast: Secondary | ICD-10-CM

## 2012-05-24 DIAGNOSIS — Z1231 Encounter for screening mammogram for malignant neoplasm of breast: Secondary | ICD-10-CM

## 2012-06-24 ENCOUNTER — Ambulatory Visit: Payer: Self-pay

## 2012-06-29 ENCOUNTER — Ambulatory Visit
Admission: RE | Admit: 2012-06-29 | Discharge: 2012-06-29 | Disposition: A | Discharge status: Home / Self Care | Payer: 59 | Source: Ambulatory Visit | Attending: Internal Medicine | Admitting: Internal Medicine

## 2012-06-29 DIAGNOSIS — Z1231 Encounter for screening mammogram for malignant neoplasm of breast: Secondary | ICD-10-CM

## 2012-06-29 DIAGNOSIS — Z803 Family history of malignant neoplasm of breast: Secondary | ICD-10-CM

## 2013-05-20 ENCOUNTER — Emergency Department (HOSPITAL_COMMUNITY): Payer: 59

## 2013-05-20 ENCOUNTER — Emergency Department (HOSPITAL_COMMUNITY)
Admission: EM | Admit: 2013-05-20 | Discharge: 2013-05-20 | Disposition: A | Discharge status: Home / Self Care | Payer: Self-pay | Attending: Emergency Medicine | Admitting: Emergency Medicine

## 2013-05-20 ENCOUNTER — Encounter (HOSPITAL_COMMUNITY): Payer: Self-pay | Admitting: Emergency Medicine

## 2013-05-20 DIAGNOSIS — F329 Major depressive disorder, single episode, unspecified: Secondary | ICD-10-CM | POA: Insufficient documentation

## 2013-05-20 DIAGNOSIS — Z79899 Other long term (current) drug therapy: Secondary | ICD-10-CM | POA: Insufficient documentation

## 2013-05-20 DIAGNOSIS — K8020 Calculus of gallbladder without cholecystitis without obstruction: Secondary | ICD-10-CM | POA: Insufficient documentation

## 2013-05-20 DIAGNOSIS — E669 Obesity, unspecified: Secondary | ICD-10-CM | POA: Insufficient documentation

## 2013-05-20 DIAGNOSIS — R109 Unspecified abdominal pain: Secondary | ICD-10-CM | POA: Insufficient documentation

## 2013-05-20 DIAGNOSIS — F3289 Other specified depressive episodes: Secondary | ICD-10-CM | POA: Insufficient documentation

## 2013-05-20 DIAGNOSIS — E785 Hyperlipidemia, unspecified: Secondary | ICD-10-CM | POA: Insufficient documentation

## 2013-05-20 DIAGNOSIS — I10 Essential (primary) hypertension: Secondary | ICD-10-CM | POA: Insufficient documentation

## 2013-05-20 DIAGNOSIS — R112 Nausea with vomiting, unspecified: Secondary | ICD-10-CM | POA: Insufficient documentation

## 2013-05-20 DIAGNOSIS — Z91040 Latex allergy status: Secondary | ICD-10-CM | POA: Insufficient documentation

## 2013-05-20 LAB — LIPASE, BLOOD: Lipase: 19 U/L (ref 11–59)

## 2013-05-20 LAB — COMPREHENSIVE METABOLIC PANEL
ALT: 41 U/L — AB (ref 0–35)
AST: 38 U/L — ABNORMAL HIGH (ref 0–37)
Albumin: 3.7 g/dL (ref 3.5–5.2)
Alkaline Phosphatase: 61 U/L (ref 39–117)
BUN: 11 mg/dL (ref 6–23)
CALCIUM: 9.9 mg/dL (ref 8.4–10.5)
CO2: 26 meq/L (ref 19–32)
CREATININE: 0.88 mg/dL (ref 0.50–1.10)
Chloride: 97 mEq/L (ref 96–112)
GFR calc Af Amer: >90 mL/min (ref >90)
GFR calc non Af Amer: 82 mL/min — ABNORMAL LOW (ref >90)
GLUCOSE: 167 mg/dL — AB (ref 70–99)
Potassium: 3.4 mEq/L — ABNORMAL LOW (ref 3.7–5.3)
SODIUM: 141 meq/L (ref 137–147)
TOTAL PROTEIN: 8.2 g/dL (ref 6.0–8.3)
Total Bilirubin: 0.3 mg/dL (ref 0.3–1.2)

## 2013-05-20 LAB — CBC
HCT: 41 % (ref 36.0–46.0)
Hemoglobin: 14.1 g/dL (ref 12.0–15.0)
MCH: 30.1 pg (ref 26.0–34.0)
MCHC: 34.4 g/dL (ref 30.0–36.0)
MCV: 87.6 fL (ref 78.0–100.0)
PLATELETS: 244 10*3/uL (ref 150–400)
RBC: 4.68 MIL/uL (ref 3.87–5.11)
RDW: 12.6 % (ref 11.5–15.5)
WBC: 6.5 10*3/uL (ref 4.0–10.5)

## 2013-05-20 MED ORDER — MORPHINE SULFATE 4 MG/ML IJ SOLN
6.0000 mg | Freq: Once | INTRAMUSCULAR | Status: AC
  Administered 2013-05-20: 6 mg via INTRAVENOUS
  Filled 2013-05-20: qty 2

## 2013-05-20 MED ORDER — ONDANSETRON HCL 4 MG/2ML IJ SOLN
4.0000 mg | Freq: Once | INTRAMUSCULAR | Status: AC
  Administered 2013-05-20: 4 mg via INTRAVENOUS
  Filled 2013-05-20: qty 2

## 2013-05-20 MED ORDER — SODIUM CHLORIDE 0.9 % IV BOLUS (SEPSIS)
1000.0000 mL | Freq: Once | INTRAVENOUS | Status: AC
  Administered 2013-05-20: 1000 mL via INTRAVENOUS

## 2013-05-20 MED ORDER — OXYCODONE-ACETAMINOPHEN 5-325 MG PO TABS: 1.0000 | ORAL_TABLET | ORAL | Status: DC | PRN

## 2013-05-20 MED ORDER — OXYCODONE-ACETAMINOPHEN 5-325 MG PO TABS
1.0000 | ORAL_TABLET | Freq: Once | ORAL | Status: AC
  Administered 2013-05-20: 1 via ORAL
  Filled 2013-05-20: qty 1

## 2013-05-20 MED ORDER — MORPHINE SULFATE 4 MG/ML IJ SOLN
4.0000 mg | Freq: Once | INTRAMUSCULAR | Status: AC
  Administered 2013-05-20: 4 mg via INTRAVENOUS
  Filled 2013-05-20: qty 1

## 2013-05-20 MED ORDER — PROMETHAZINE HCL 25 MG PO TABS: 25.0000 mg | ORAL_TABLET | Freq: Four times a day (QID) | ORAL | Status: DC | PRN

## 2013-05-20 MED ORDER — HYDROMORPHONE HCL PF 1 MG/ML IJ SOLN
1.0000 mg | Freq: Once | INTRAMUSCULAR | Status: AC
  Administered 2013-05-20: 1 mg via INTRAVENOUS
  Filled 2013-05-20: qty 1

## 2013-05-20 MED ORDER — METOCLOPRAMIDE HCL 5 MG/ML IJ SOLN
10.0000 mg | Freq: Once | INTRAMUSCULAR | Status: AC
  Administered 2013-05-20: 10 mg via INTRAVENOUS
  Filled 2013-05-20: qty 2

## 2013-05-20 NOTE — ED Provider Notes (Signed)
CSN: [id number]     Arrival date & time 05/20/13  1137 History   First MD Initiated Contact with Patient 05/20/13 1141     Chief Complaint  Patient presents with  . Abdominal Pain  . Emesis    HPI Patient presents to emergency department because of severe epigastric and right upper quadrant abdominal pain with associated nausea and vomiting. She reports her pain as sharp. Her pain was 10 out of 10 on arrival. She similar episode to this through last weekend. She has no known history of gallstones. Effusion or chills. Mild cough without significant shortness of breath. No flank pain. No urinary complaints.   Past Medical History  Diagnosis Date  . Genital herpes   . Right knee pain   . Vaginitis   . Acute bronchitis   . Morbid obesity   . Hypertension   . Chest pain   . Lump or mass in breast   . Vitiligo   . Insomnia   . Hyperlipidemia   . Depression   . Colon polyps    History reviewed. No pertinent past surgical history. No family history on file. History  Substance Use Topics  . Smoking status: Never Smoker   . Smokeless tobacco: Not on file  . Alcohol Use: Yes   OB History   Grav Para Term Preterm Abortions TAB SAB Ect Mult Living                 Review of Systems  All other systems reviewed and are negative.      Allergies  Apple; Latex; Rice; and Wheat bran  Home Medications   Current Outpatient Rx  Name  Route  Sig  Dispense  Refill  . Multiple Vitamin (MULTIVITAMIN WITH MINERALS) TABS tablet   Oral   Take 1 tablet by mouth daily.         Marland Kitchen olmesartan-hydrochlorothiazide (BENICAR HCT) 40-25 MG per tablet   Oral   Take 1 tablet by mouth daily.           Marland Kitchen PARoxetine (PAXIL) 40 MG tablet   Oral   Take 40 mg by mouth daily.          BP 149/95  Pulse 54  Temp(Src) 98 F (36.7 C)  Resp 14  SpO2 96% Physical Exam  Nursing note and vitals reviewed. Constitutional: She is oriented to person, place, and time. She appears well-developed  and well-nourished. No distress.  HENT:  Head: Normocephalic and atraumatic.  Eyes: EOM are normal.  Neck: Normal range of motion.  Cardiovascular: Normal rate, regular rhythm and normal heart sounds.   Pulmonary/Chest: Effort normal and breath sounds normal.  Abdominal: Soft. She exhibits no distension. There is no tenderness.  Obese  Musculoskeletal: Normal range of motion.  Neurological: She is alert and oriented to person, place, and time.  Skin: Skin is warm and dry.  Psychiatric: She has a normal mood and affect. Judgment normal.    ED Course  Procedures (including critical care time) Labs Review Labs Reviewed  COMPREHENSIVE METABOLIC PANEL - Abnormal; Notable for the following:    Potassium 3.4 (*)    Glucose, Bld 167 (*)    AST 38 (*)    ALT 41 (*)    GFR calc non Af Amer 82 (*)    All other components within normal limits  CBC  LIPASE, BLOOD   Imaging Review Dg Chest 2 View  05/20/2013   CLINICAL DATA:  CLINICAL DATA Vomiting this morning, epigastric  pain, history hypertension, hyperlipidemia  EXAM: CHEST  2 VIEW  COMPARISON:  None.  FINDINGS: Upper normal heart size.  Normal mediastinal contours and pulmonary vascularity.  Lungs clear.  No pleural effusion or pneumothorax.  Bones unremarkable.  IMPRESSION: No active cardiopulmonary disease.   Electronically Signed   By: Lavonia Dana M.D.   On: 05/20/2013 13:20   US Abdomen Complete  05/20/2013   CLINICAL DATA:  38 year old female with abdominal pain and vomiting  EXAM: ULTRASOUND ABDOMEN COMPLETE  COMPARISON:  None.  FINDINGS: Gallbladder:  Multiple mobile gallstones are identified, the largest measuring 1.3 cm. There is no evidence of gallbladder wall thickening, pericholecystic fluid or sonographic Murphy's sign.  Common bile duct:  Diameter: There is no evidence of intrahepatic or extrahepatic biliary dilatation. The visualized CBD is unremarkable measuring 5 mm.  Liver:  Mild diffuse increased echogenicity of the  hepatic parenchyma is identified likely representing mild fatty infiltration. No focal hepatic abnormalities are noted.  IVC:  No abnormality identified but the infrahepatic IVC is not well visualized.  Pancreas:  Visualized portion unremarkable.  Spleen:  Size and appearance within normal limits.  Right Kidney:  Length: 10.9 cm. Echogenicity within normal limits. No mass or hydronephrosis visualized.  Left Kidney:  Length: 11.2 cm. Echogenicity within normal limits. No mass or hydronephrosis visualized.  Abdominal aorta:  No aneurysm visualized.  Other findings:  None.  IMPRESSION: Cholelithiasis without evidence of acute cholecystitis. No evidence of biliary the patient.  Question mild fatty infiltration of the liver.   Electronically Signed   By: Hassan Rowan M.D.   On: 05/20/2013 13:44  I personally reviewed the imaging tests through PACS system I reviewed available ER/hospitalization records through the EMR    EKG Interpretation None      MDM   Final diagnoses:  None    2:08 PM Outpatient general surgery followup for cholelithiasis. Patient is feeling better at this time. Discharged in good condition. She understands to return to the ED of regular worsening symptoms    Hoy Morn, MD 05/20/13 1410

## 2013-05-20 NOTE — ED Notes (Signed)
Pt reports vomiting x 10 this am, sharp pain in epigastric area followed by vomiting, pain 10/10, pt clammy upon arrival.

## 2013-05-20 NOTE — Discharge Instructions (Signed)
Cholelithiasis °Cholelithiasis (also called gallstones) is a form of gallbladder disease in which gallstones form in your gallbladder. The gallbladder is an organ that stores bile made in the liver, which helps digest fats. Gallstones begin as small crystals and slowly grow into stones. Gallstone pain occurs when the gallbladder spasms and a gallstone is blocking the duct. Pain can also occur when a stone passes out of the duct.  °RISK FACTORS °· Being female.   °· Having multiple pregnancies. Health care providers sometimes advise removing diseased gallbladders before future pregnancies.   °· Being obese. °· Eating a diet heavy in fried foods and fat.   °· Being older than 60 years and increasing age.   °· Prolonged use of medicines containing female hormones.   °· Having diabetes mellitus.   °· Rapidly losing weight.   °· Having a family history of gallstones (heredity).   °SYMPTOMS °· Nausea.   °· Vomiting. °· Abdominal pain.   °· Yellowing of the skin (jaundice).   °· Sudden pain. It may persist from several minutes to several hours. °· Fever.   °· Tenderness to the touch.  °In some cases, when gallstones do not move into the bile duct, people have no pain or symptoms. These are called "silent" gallstones.  °TREATMENT °Silent gallstones do not need treatment. In severe cases, emergency surgery may be required. Options for treatment include: °· Surgery to remove the gallbladder. This is the most common treatment. °· Medicines. These do not always work and may take 6 12 months or more to work. °· Shock wave treatment (extracorporeal biliary lithotripsy). In this treatment an ultrasound machine sends shock waves to the gallbladder to break gallstones into smaller pieces that can pass into the intestines or be dissolved by medicine. °HOME CARE INSTRUCTIONS  °· Only take over-the-counter or prescription medicines for pain, discomfort, or fever as directed by your health care provider.   °· Follow a low-fat diet until  seen again by your health care provider. Fat causes the gallbladder to contract, which can result in pain.   °· Follow up with your health care provider as directed. Attacks are almost always recurrent and surgery is usually required for permanent treatment.   °SEEK IMMEDIATE MEDICAL CARE IF:  °· Your pain increases and is not controlled by medicines.   °· You have a fever or persistent symptoms for more than 2 3 days.   °· You have a fever and your symptoms suddenly get worse.   °· You have persistent nausea and vomiting.   °MAKE SURE YOU:  °· Understand these instructions. °· Will watch your condition. °· Will get help right away if you are not doing well or get worse. °Document Released: 02/12/2005 Document Revised: 10/19/2012 Document Reviewed: 08/10/2012 °[employer]® Patient Information ©2014 [employer]. ° °

## 2013-05-20 NOTE — ED Notes (Signed)
US at bedside

## 2013-08-23 ENCOUNTER — Emergency Department (HOSPITAL_COMMUNITY)
Admission: EM | Admit: 2013-08-23 | Discharge: 2013-08-23 | Disposition: A | Discharge status: Home / Self Care | Payer: No Typology Code available for payment source | Source: Home / Self Care | Attending: Family Medicine | Admitting: Family Medicine

## 2013-08-23 ENCOUNTER — Encounter (HOSPITAL_COMMUNITY): Payer: Self-pay | Admitting: Emergency Medicine

## 2013-08-23 DIAGNOSIS — J40 Bronchitis, not specified as acute or chronic: Secondary | ICD-10-CM

## 2013-08-23 MED ORDER — IPRATROPIUM-ALBUTEROL 0.5-2.5 (3) MG/3ML IN SOLN
RESPIRATORY_TRACT | Status: AC
  Filled 2013-08-23: qty 3

## 2013-08-23 MED ORDER — ALBUTEROL SULFATE HFA 108 (90 BASE) MCG/ACT IN AERS: 2.0000 | INHALATION_SPRAY | Freq: Four times a day (QID) | RESPIRATORY_TRACT | Status: DC | PRN

## 2013-08-23 MED ORDER — TRAMADOL HCL 50 MG PO TABS: 50.0000 mg | ORAL_TABLET | Freq: Every evening | ORAL | Status: DC | PRN

## 2013-08-23 MED ORDER — IPRATROPIUM-ALBUTEROL 0.5-2.5 (3) MG/3ML IN SOLN
3.0000 mL | Freq: Once | RESPIRATORY_TRACT | Status: AC
  Administered 2013-08-23: 3 mL via RESPIRATORY_TRACT

## 2013-08-23 MED ORDER — PREDNISONE 10 MG PO TABS: 30.0000 mg | ORAL_TABLET | Freq: Every day | ORAL | Status: DC

## 2013-08-23 MED ORDER — IPRATROPIUM BROMIDE 0.06 % NA SOLN: 2.0000 | Freq: Four times a day (QID) | NASAL | Status: DC

## 2013-08-23 NOTE — Discharge Instructions (Signed)
Thank you for coming in today. Call or go to the emergency room if you get worse, have trouble breathing, have chest pains, or palpitations.    Bronchitis Bronchitis is inflammation of the airways that extend from the windpipe into the lungs (bronchi). The inflammation often causes mucus to develop, which leads to a cough. If the inflammation becomes severe, it may cause shortness of breath. CAUSES  Bronchitis may be caused by:   Viral infections.   Bacteria.   Cigarette smoke.   Allergens, pollutants, and other irritants.  SIGNS AND SYMPTOMS  The most common symptom of bronchitis is a frequent cough that produces mucus. Other symptoms include:  Fever.   Body aches.   Chest congestion.   Chills.   Shortness of breath.   Sore throat.  DIAGNOSIS  Bronchitis is usually diagnosed through a medical history and physical exam. Tests, such as chest X-rays, are sometimes done to rule out other conditions.  TREATMENT  You may need to avoid contact with whatever caused the problem (smoking, for example). Medicines are sometimes needed. These may include:  Antibiotics. These may be prescribed if the condition is caused by bacteria.  Cough suppressants. These may be prescribed for relief of cough symptoms.   Inhaled medicines. These may be prescribed to help open your airways and make it easier for you to breathe.   Steroid medicines. These may be prescribed for those with recurrent (chronic) bronchitis. HOME CARE INSTRUCTIONS  Get plenty of rest.   Drink enough fluids to keep your urine clear or pale yellow (unless you have a medical condition that requires fluid restriction). Increasing fluids may help thin your secretions and will prevent dehydration.   Only take over-the-counter or prescription medicines as directed by your health care provider.  Only take antibiotics as directed. Make sure you finish them even if you start to feel better.  Avoid secondhand  smoke, irritating chemicals, and strong fumes. These will make bronchitis worse. If you are a smoker, quit smoking. Consider using nicotine gum or skin patches to help control withdrawal symptoms. Quitting smoking will help your lungs heal faster.   Put a cool-mist humidifier in your bedroom at night to moisten the air. This may help loosen mucus. Change the water in the humidifier daily. You can also run the hot water in your shower and sit in the bathroom with the door closed for 5-10 minutes.   Follow up with your health care provider as directed.   Wash your hands frequently to avoid catching bronchitis again or spreading an infection to others.  SEEK MEDICAL CARE IF: Your symptoms do not improve after 1 week of treatment.  SEEK IMMEDIATE MEDICAL CARE IF:  Your fever increases.  You have chills.   You have chest pain.   You have worsening shortness of breath.   You have bloody sputum.  You faint.  You have lightheadedness.  You have a severe headache.   You vomit repeatedly. MAKE SURE YOU:   Understand these instructions.  Will watch your condition.  Will get help right away if you are not doing well or get worse. Document Released: 02/16/2005 Document Revised: 12/07/2012 Document Reviewed: 10/11/2012 [facility] Patient Information 2015 [place], [place]. This information is not intended to replace advice given to you by your health care provider. Make sure you discuss any questions you have with your health care provider.

## 2013-08-23 NOTE — ED Notes (Signed)
C/o sore throat.  Cough, productive.  X 2 wks.   Denies fever.   No relief with amoxicillin

## 2013-08-23 NOTE — ED Provider Notes (Signed)
Darlene [name] is a 38 y.o. female who presents to Urgent Care today for cough and sore throat. Patient was recently treated for strep throat with amoxicillin. She notes a persistent cough and itchy scratchy throat. The cough has become more persistent and productive. She denies any fevers or chills chest pains or palpitations. She denies any  diarrhea. She has had one or 2 episodes of posttussive emesis.  Past Medical History  Diagnosis Date  . Genital herpes   . Right knee pain   . Vaginitis   . Acute bronchitis   . Morbid obesity   . Hypertension   . Chest pain   . Lump or mass in breast   . Vitiligo   . Insomnia   . Hyperlipidemia   . Depression   . Colon polyps    History  Substance Use Topics  . Smoking status: Never Smoker   . Smokeless tobacco: Not on file  . Alcohol Use: Yes   ROS as above Medications: No current facility-administered medications for this encounter.   Current Outpatient Prescriptions  Medication Sig Dispense Refill  . olmesartan-hydrochlorothiazide (BENICAR HCT) 40-25 MG per tablet Take 1 tablet by mouth daily.        Marland Kitchen PARoxetine (PAXIL) 40 MG tablet Take 40 mg by mouth daily.      Marland Kitchen albuterol (PROVENTIL HFA;VENTOLIN HFA) 108 (90 BASE) MCG/ACT inhaler Inhale 2 puffs into the lungs every 6 (six) hours as needed for wheezing or shortness of breath.  1 Inhaler  2  . ipratropium (ATROVENT) 0.06 % nasal spray Place 2 sprays into both nostrils 4 (four) times daily.  15 mL  1  . Multiple Vitamin (MULTIVITAMIN WITH MINERALS) TABS tablet Take 1 tablet by mouth daily.      Marland Kitchen oxyCODONE-acetaminophen (PERCOCET/ROXICET) 5-325 MG per tablet Take 1 tablet by mouth every 4 (four) hours as needed for severe pain.  20 tablet  0  . predniSONE (DELTASONE) 10 MG tablet Take 3 tablets (30 mg total) by mouth daily.  15 tablet  0  . promethazine (PHENERGAN) 25 MG tablet Take 1 tablet (25 mg total) by mouth every 6 (six) hours as needed for nausea or vomiting.  30 tablet  0   . traMADol (ULTRAM) 50 MG tablet Take 1 tablet (50 mg total) by mouth at bedtime as needed (cough).  10 tablet  0    Exam:  BP 107/75  Pulse 71  Temp(Src) 98.6 F (37 C) (Oral)  SpO2 98%  LMP 08/23/2013 Gen: Well NAD HEENT: EOMI,  MMM posterior pharynx without significant erythema. Cobblestoning is present. Tympanic membranes are normal appearing bilaterally. Lungs: Normal work of breathing. CTABL frequent coughing Heart: RRR no MRG Abd: NABS, Soft. NT, ND Exts: Brisk capillary refill, warm and well perfused.   Patient was given a DuoNeb nebulizer treatment, and felt better   No results found for this or any previous visit (from the past 24 hour(s)). No results found.  Assessment and Plan: 38 y.o. female with bronchitis. Plan to treat with prednisone, albuterol, tramadol for cough, and Atrovent nasal spray.  Discussed warning signs or symptoms. Please see discharge instructions. Patient expresses understanding.    Gregor Hams, MD 08/23/13 2016

## 2014-11-16 ENCOUNTER — Encounter: Payer: Self-pay | Admitting: Gastroenterology

## 2014-12-18 IMAGING — US US ABDOMEN COMPLETE
1 series · 13 of 25 positions shown · non-contrast
Comparison: None.

CLINICAL DATA: 37-year-old female with abdominal pain and vomiting

EXAM:
ULTRASOUND ABDOMEN COMPLETE

[Series 1: us abdomen complete · 0.27mm/px · 13 of 61 slices shown]
[im 1/61]
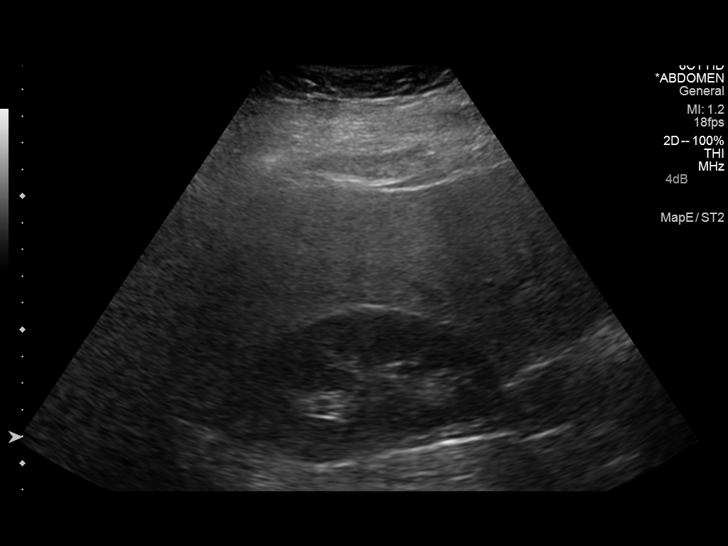
[im 6/61]
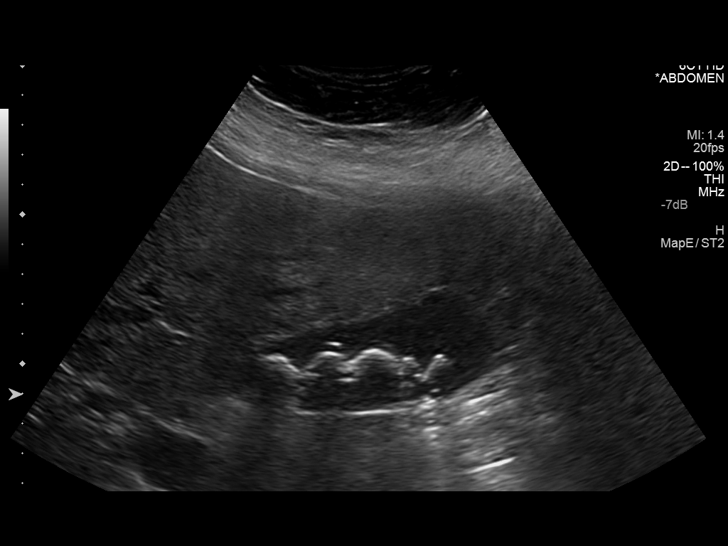
[im 11/61]
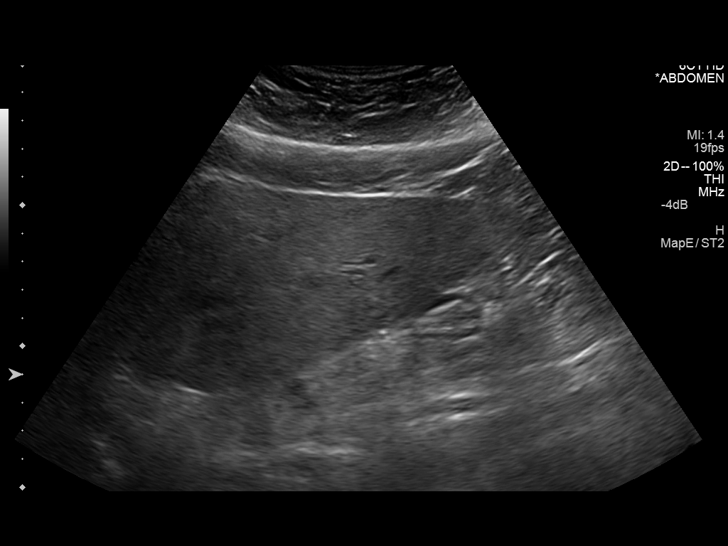
[im 16/61]
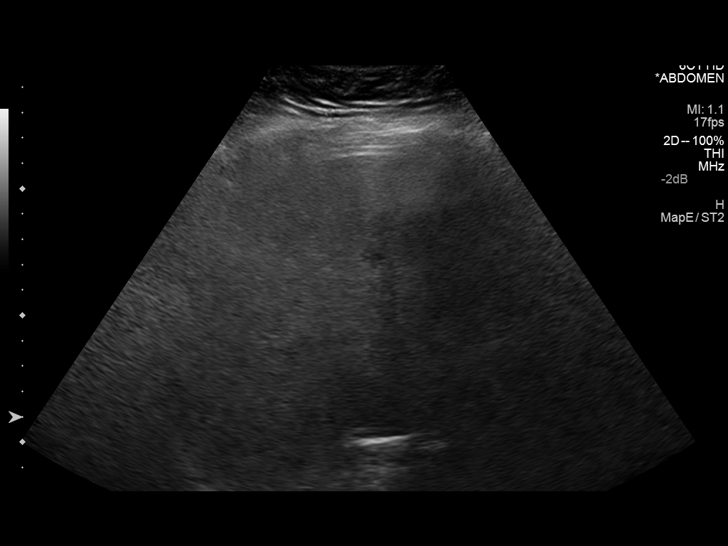
[im 21/61]
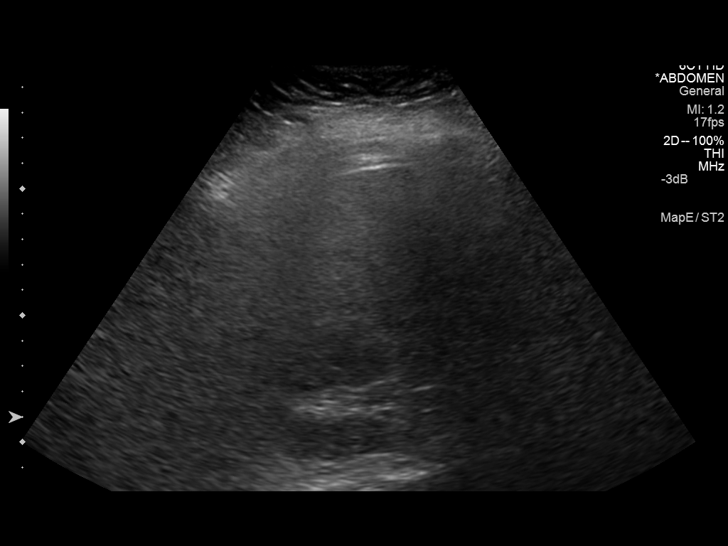
[im 26/61]
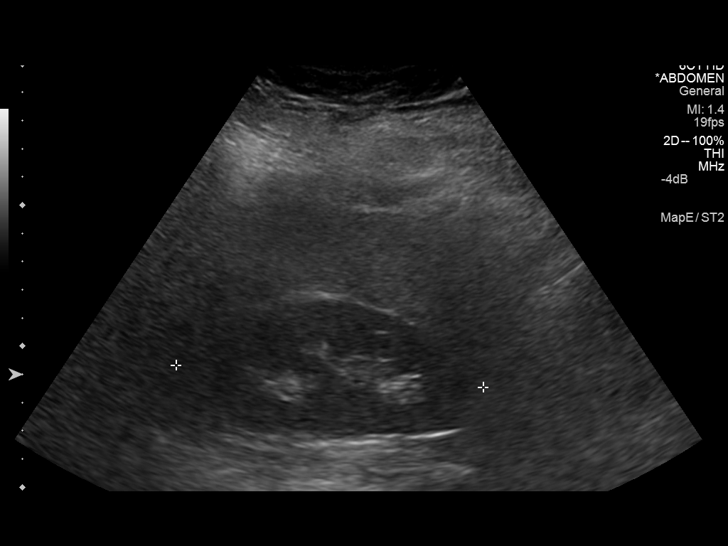
[im 31/61]
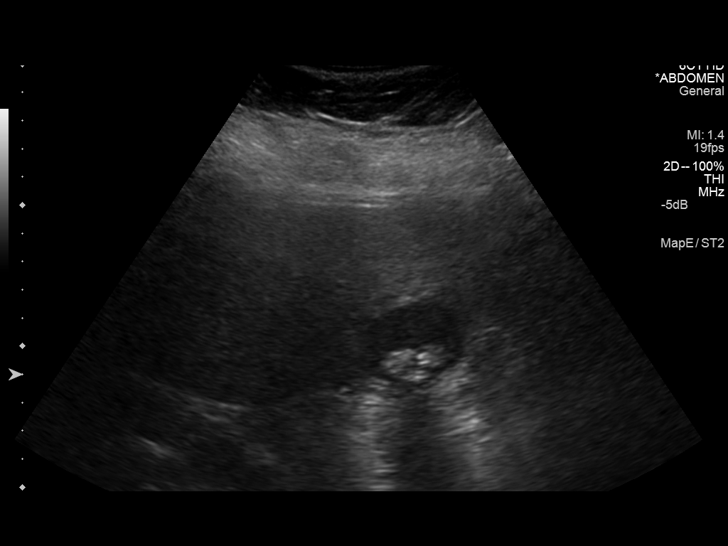
[im 36/61]
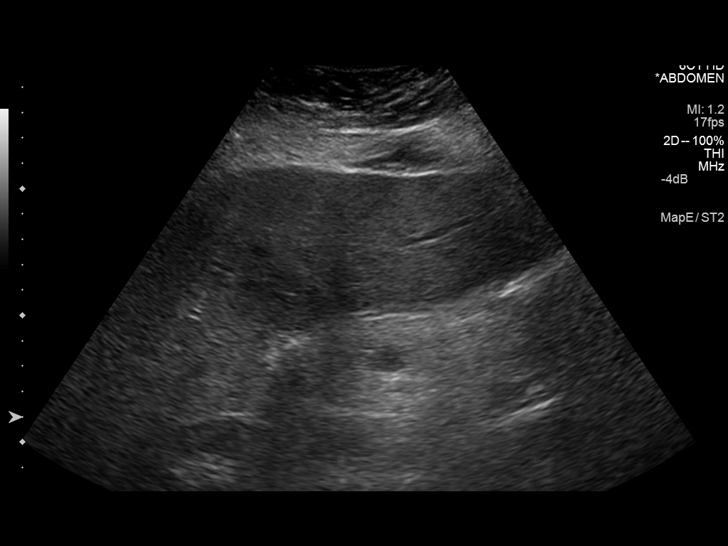
[im 41/61]
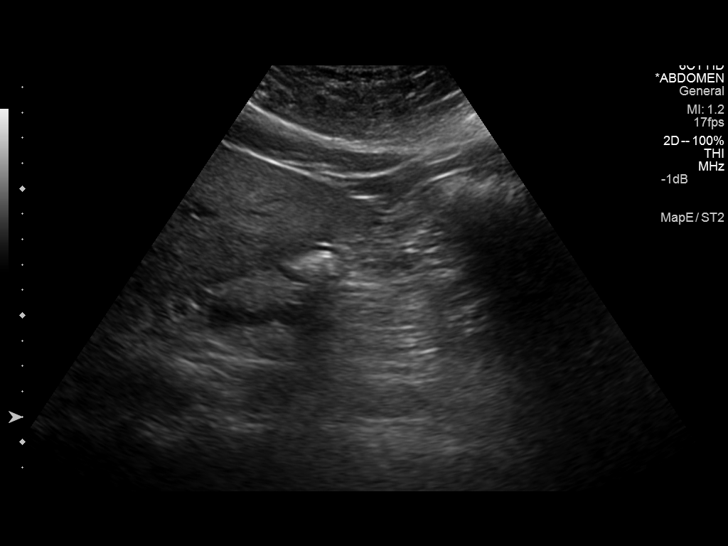
[im 46/61]
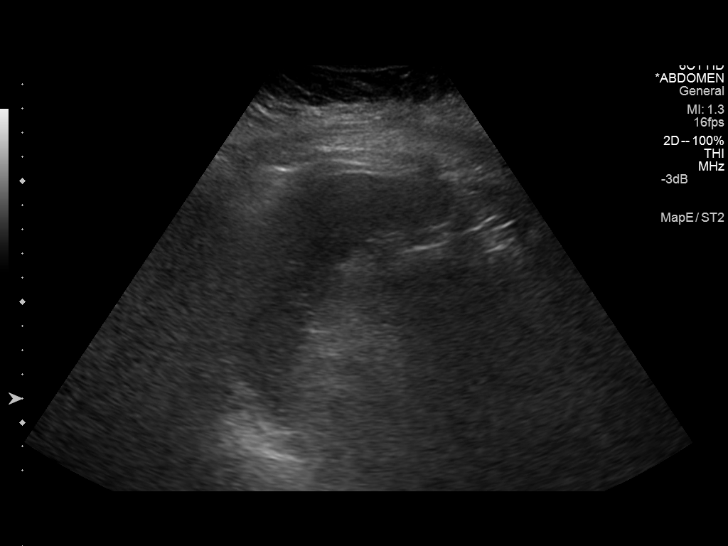
[im 51/61]
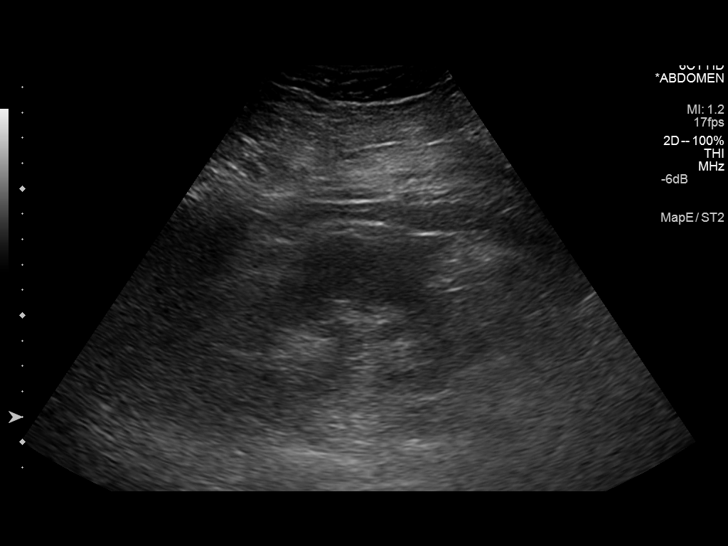
[im 56/61]
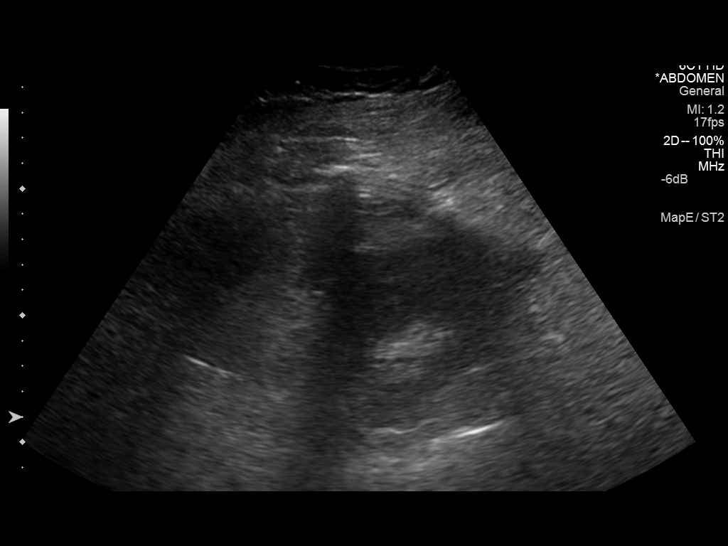
[im 61/61]
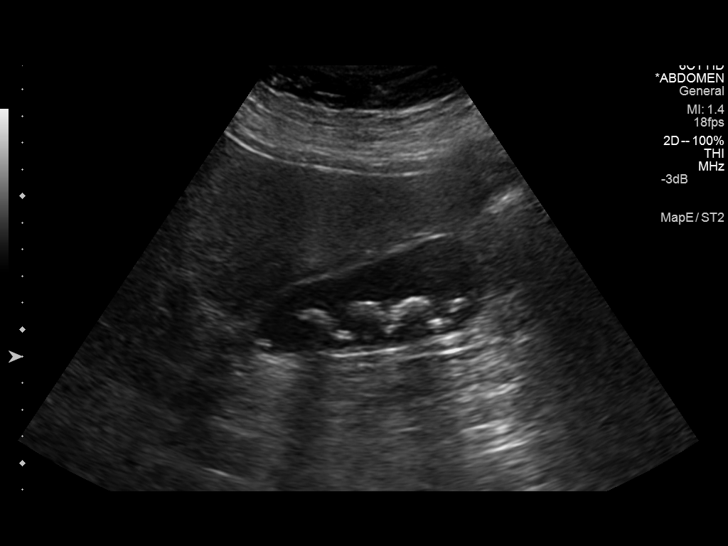

[13 of 25 positions shown; findings below may reference images not displayed]

FINDINGS: Gallbladder:

Multiple mobile gallstones are identified, the largest measuring
cm. There is no evidence of gallbladder wall thickening,
pericholecystic fluid or sonographic Murphy's sign.

Common bile duct:

Diameter: There is no evidence of intrahepatic or extrahepatic
biliary dilatation. The visualized CBD is unremarkable measuring 5
mm.

Liver:

Mild diffuse increased echogenicity of the hepatic parenchyma is
identified likely representing mild fatty infiltration. No focal
hepatic abnormalities are noted.

IVC:

No abnormality identified but the infrahepatic IVC is not well
visualized.

Pancreas:

Visualized portion unremarkable.

Spleen:

Size and appearance within normal limits.

Right Kidney:

Length: 10.9 cm. Echogenicity within normal limits. No mass or
hydronephrosis visualized.

Left Kidney:

Length: 11.2 cm. Echogenicity within normal limits. No mass or
hydronephrosis visualized.

Abdominal aorta:

No aneurysm visualized.

Other findings:

None.
IMPRESSION: Cholelithiasis without evidence of acute cholecystitis. No evidence
of biliary the patient.

Question mild fatty infiltration of the liver.

## 2014-12-18 IMAGING — CR DG CHEST 2V
2 series · 2 of 2 positions shown · non-contrast
Comparison: None.

CLINICAL DATA: CLINICAL DATA
Vomiting this morning, epigastric pain, history hypertension,
hyperlipidemia

EXAM:
CHEST  2 VIEW

[w chest pa]
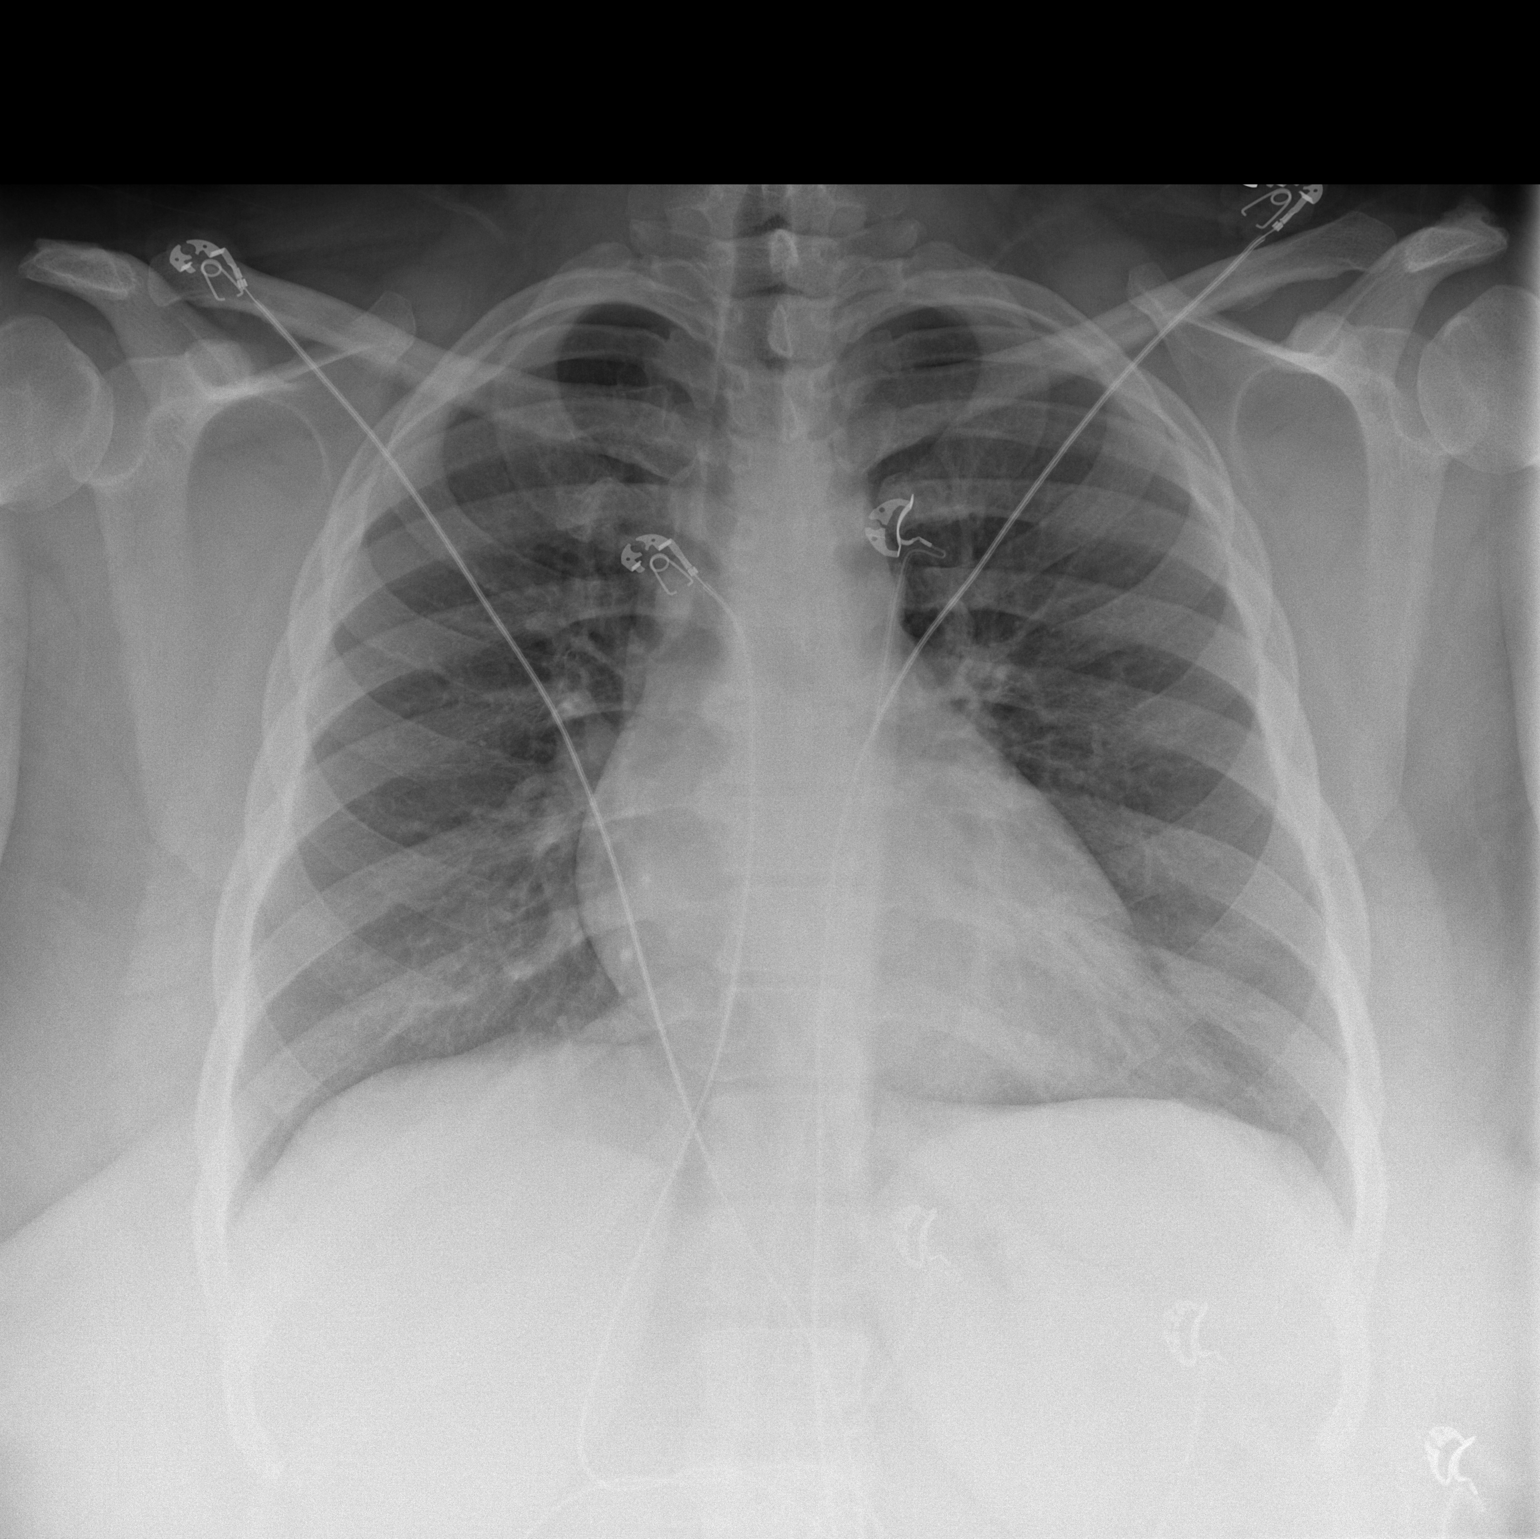

[w chest lat]
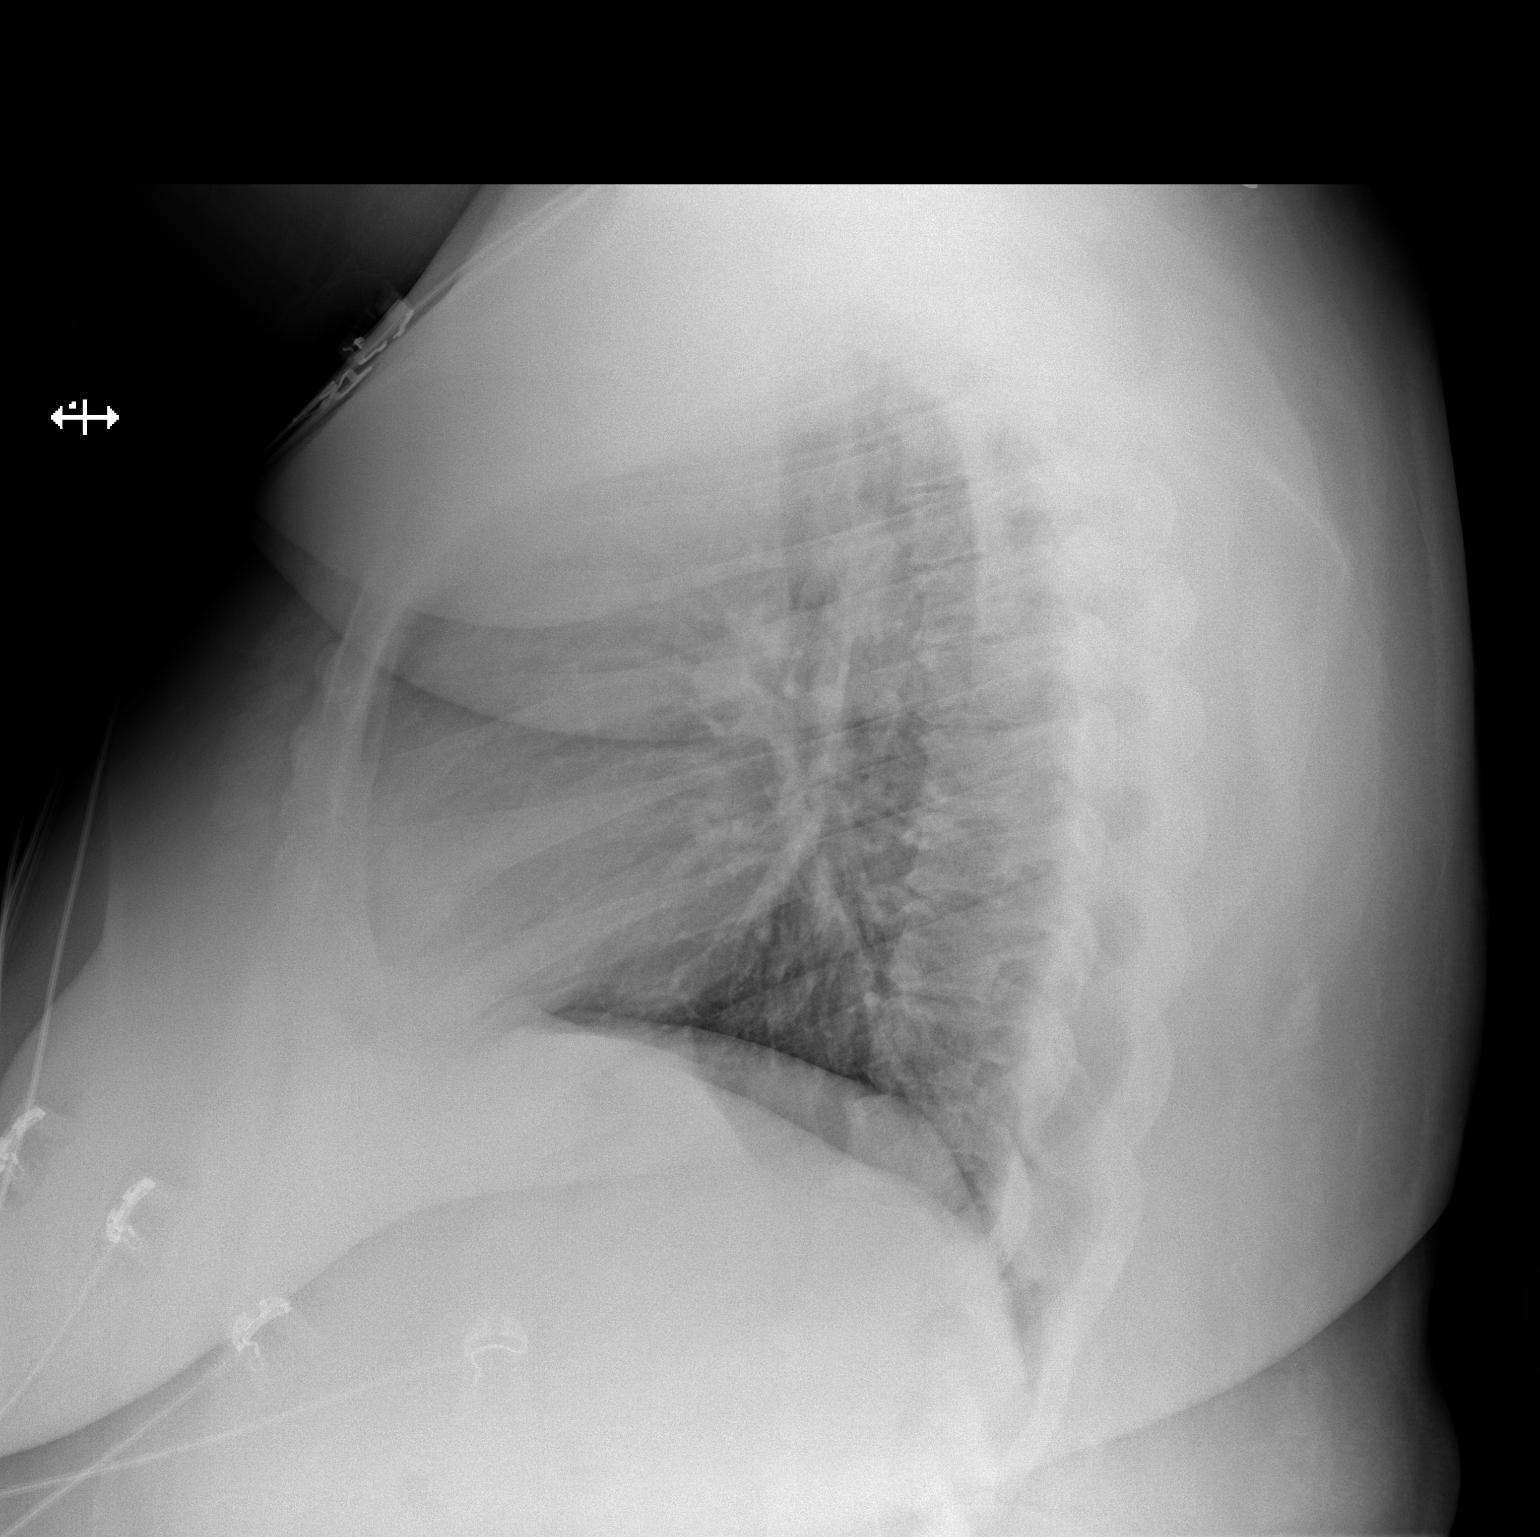

[2 of 2 positions shown; findings below may reference images not displayed]

FINDINGS: Upper normal heart size.

Normal mediastinal contours and pulmonary vascularity.

Lungs clear.

No pleural effusion or pneumothorax.

Bones unremarkable.
IMPRESSION: No active cardiopulmonary disease.

## 2019-08-02 DIAGNOSIS — R7303 Prediabetes: Secondary | ICD-10-CM | POA: Insufficient documentation

## 2019-08-02 DIAGNOSIS — K8020 Calculus of gallbladder without cholecystitis without obstruction: Secondary | ICD-10-CM | POA: Insufficient documentation

## 2019-10-26 ENCOUNTER — Institutional Professional Consult (permissible substitution): Payer: 59 | Admitting: Neurology

## 2019-11-15 ENCOUNTER — Institutional Professional Consult (permissible substitution): Payer: 59 | Admitting: Neurology

## 2019-11-20 DIAGNOSIS — E282 Polycystic ovarian syndrome: Secondary | ICD-10-CM | POA: Insufficient documentation

## 2019-11-20 DIAGNOSIS — N926 Irregular menstruation, unspecified: Secondary | ICD-10-CM | POA: Insufficient documentation

## 2019-11-23 ENCOUNTER — Institutional Professional Consult (permissible substitution): Payer: 59 | Admitting: Neurology

## 2019-12-01 ENCOUNTER — Ambulatory Visit
Admission: RE | Admit: 2019-12-01 | Discharge: 2019-12-01 | Disposition: A | Discharge status: Home / Self Care | Payer: Self-pay | Source: Ambulatory Visit | Attending: Family Medicine | Admitting: Family Medicine

## 2019-12-01 ENCOUNTER — Other Ambulatory Visit: Payer: Self-pay | Admitting: Family Medicine

## 2019-12-01 DIAGNOSIS — M25562 Pain in left knee: Secondary | ICD-10-CM

## 2019-12-21 ENCOUNTER — Encounter: Payer: Self-pay | Admitting: Neurology

## 2019-12-25 ENCOUNTER — Ambulatory Visit (INDEPENDENT_AMBULATORY_CARE_PROVIDER_SITE_OTHER): Payer: 59 | Admitting: Neurology

## 2019-12-25 ENCOUNTER — Encounter: Payer: Self-pay | Admitting: Neurology

## 2019-12-25 VITALS — BP 107/71 | HR 68 | Ht 64.0 in | Wt 320.0 lb

## 2019-12-25 DIAGNOSIS — D500 Iron deficiency anemia secondary to blood loss (chronic): Secondary | ICD-10-CM

## 2019-12-25 DIAGNOSIS — G4700 Insomnia, unspecified: Secondary | ICD-10-CM

## 2019-12-25 DIAGNOSIS — G4733 Obstructive sleep apnea (adult) (pediatric): Secondary | ICD-10-CM | POA: Diagnosis not present

## 2019-12-25 DIAGNOSIS — E669 Obesity, unspecified: Secondary | ICD-10-CM | POA: Insufficient documentation

## 2019-12-25 DIAGNOSIS — G4719 Other hypersomnia: Secondary | ICD-10-CM

## 2019-12-25 DIAGNOSIS — G4753 Recurrent isolated sleep paralysis: Secondary | ICD-10-CM

## 2019-12-25 DIAGNOSIS — E662 Morbid (severe) obesity with alveolar hypoventilation: Secondary | ICD-10-CM | POA: Diagnosis not present

## 2019-12-25 NOTE — Progress Notes (Signed)
SLEEP MEDICINE CLINIC    Provider:  Larey Seat, MD  Primary Care Physician:  Forrest Moron, MD [phone] [place] [address]     Referring Provider: Forrest Moron, [place] [address] [place],  [place] [address]          Chief Complaint according to patient   Patient presents with:    . New Patient (Initial Visit)           HISTORY OF PRESENT ILLNESS:  Darlene [name] is a 44 y.o. year old 34 or [place] American female patient seen here as a referral on 12/25/2019 from Forrest Moron, MD .  Chief concern-New sleep consult for hypersomnia, excessive daytime sleepiness .  Previous sleep study over 10 years ago diagnosed OSA.  Her CPAP machine stopped working and she has not used it since, many year   I have the pleasure of seeing Darlene [name] today, a right -handed Black or [place] American female with a  sleep disorder. She  has a past medical history of Acute bronchitis, Chest pain, Colon polyps, Depression, Diabetes mellitus without complication ([place]), Genital herpes, Hyperlipidemia, Hypertension, Insomnia, Lump or mass in breast, Morbid obesity with BMI 55 ([place]), OSA (obstructive sleep apnea), Right  Osteoarthritis with knee pain, and Vitiligo.   Sleep relevant medical history: loud snorer, Nocturia 2-4 times , Sleep waking - unknown causes. Sleep talking.  Night terrors- sleep paralysis, noTonsillectomy, cervical spine trauma, whiplash, deviated septum , rhinitis.    Family medical /sleep history: twin sister on CPAP with OSA, some  insomnia, no sleep walkers.    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  The patient currently works in daytime. Pets are not present. Tobacco use: none.   ETOH use ; socialy,  Caffeine intake in form of Coffee( 2 in AM ) Soda( rarely pepsi ) Tea ( /) or energy  drinks. Regular exercise in form of work   Hobbies : reading , movies, music.   Sleep habits are as follows: The patient's dinner time is between 5-8 PM.  The patient goes to bed at 9 PM and struggles to go to sleep-  once asleep she continues to sleep for intervals of 45-60 minutes, wakes for many bathroom breaks.  Total sleep time estimated at 4 hors.  The preferred sleep position is supine , with the support of 2 pillows. She used to like prone sleep, but recently can't tolerate.  Dreams are reportedly frequent/vivid.  6  AM is the usual rise time. The patient wakes up at 4.30 and 5.15 with an alarm. Hard time to get up.  She reports not feeling refreshed or restored in AM, with symptoms such as dry mouth, and residual fatigue, some sinus headaches.  Naps are taken frequently with the baby-, lasting from 60 minutes and are more refreshing than nocturnal sleep.    Review of Systems: Out of a complete 14 system review, the patient complains of only the following symptoms, and all other reviewed systems are negative.:  Fatigue, sleepiness , snoring, fragmented sleep, Insomnia - fragmented by nocturia, dreams, GERD?   Sleepparalysis, vivid dreams, and feeling as if she talked or was shaken by a person, not in the room.    How likely are you to doze  in the following situations: 0 = not likely, 1 = slight chance, 2 = moderate chance, 3 = high chance   Sitting and Reading? Watching Television? Sitting inactive in a public place (theater or meeting)? As a passenger in a car for an hour without a break? Lying down in the afternoon when circumstances permit? Sitting and talking to someone? Sitting quietly after lunch without alcohol? In a car, while stopped for a few minutes in traffic?   Total = 21/ 24 points   FSS endorsed at 61/ 63 points.   Social History   Socioeconomic History  . Marital status: Single    Spouse name: Not on file  . Number of children: 0  . Years of education:  some college  . Highest education level: Not on file  Occupational History  . Occupation: customer service  Tobacco Use  . Smoking status: Never Smoker  . Smokeless tobacco: Never Used  Substance and Sexual Activity  . Alcohol use: Yes    Comment: social  . Drug use: No  . Sexual activity: Yes    Birth control/protection: None  Other Topics Concern  . Not on file  Social History Narrative   Lives with her mother.   Right-handed.   Caffeine use: 1-2 cups per day.   Social Determinants of Health   Financial Resource Strain:   . Difficulty of Paying Living Expenses: Not on file  Food Insecurity:   . Worried About Charity fundraiser in the Last Year: Not on file  . Ran Out of Food in the Last Year: Not on file  Transportation Needs:   . Lack of Transportation (Medical): Not on file  . Lack of Transportation (Non-Medical): Not on file  Physical Activity:   . Days of Exercise per Week: Not on file  . Minutes of Exercise per Session: Not on file  Stress:   . Feeling of Stress : Not on file  Social Connections:   . Frequency of Communication with Friends and Family: Not on file  . Frequency of Social Gatherings with Friends and Family: Not on file  . Attends Religious Services: Not on file  . Active Member of Clubs or Organizations: Not on file  . Attends Archivist Meetings: Not on file  . Marital Status: Not on file    Family History  Problem Relation Age of Onset  . Breast cancer Mother   . Ovarian cysts Mother   . Hypertension Mother   . Diabetes Mother   . Stroke Father   . Diabetes Father   . Cancer Father        bone marrow    Past Medical History:  Diagnosis Date  . Acute bronchitis   . Chest pain   . Colon polyps   . Depression   . Diabetes mellitus without complication ([place])   . Genital herpes   . Hyperlipidemia   . Hypertension   . Insomnia   . Lump or mass in breast   . Morbid obesity ([place])   . OSA (obstructive sleep apnea)   .  Right knee pain   . Vaginitis   . Vitiligo     Past Surgical History:  Procedure Laterality Date  . COLONOSCOPY    . WISDOM TOOTH EXTRACTION       Current Outpatient Medications on File Prior to Visit  Medication Sig Dispense Refill  . glimepiride (AMARYL) 2 MG tablet Take 2 mg by mouth daily.    Marland Kitchen levocetirizine (XYZAL)  5 MG tablet Take 5 mg by mouth as needed.     Marland Kitchen losartan-hydrochlorothiazide (HYZAAR) 100-25 MG tablet Take 1 tablet by mouth daily.    . meloxicam (MOBIC) 15 MG tablet Take 15 mg by mouth daily.    . metFORMIN (GLUCOPHAGE) 1000 MG tablet Take 1,000 mg by mouth daily.    . Multiple Vitamin (MULTIVITAMIN WITH MINERALS) TABS tablet Take 1 tablet by mouth daily.    Marland Kitchen PARoxetine (PAXIL) 40 MG tablet Take 40 mg by mouth daily.    . rosuvastatin (CRESTOR) 10 MG tablet Take 10 mg by mouth daily.    Marland Kitchen spironolactone (ALDACTONE) 50 MG tablet Take 50 mg by mouth daily.     No current facility-administered medications on file prior to visit.    Allergies  Allergen Reactions  . Apple Rash  . Latex Rash  . Rice Rash  . Wheat Bran Rash    Physical exam:  Today's Vitals   12/25/19 1113  BP: 107/71  Pulse: 68  Weight: (!) 320 lb (145.2 kg)  Height: _0  (1.626 m)   Body mass index is 54.93 kg/m.   Wt Readings from Last 3 Encounters:  12/25/19 (!) 320 lb (145.2 kg)     Ht Readings from Last 3 Encounters:  12/25/19 _1  (1.626 m)      General: The patient is awake, alert and appears not in acute distress. The patient is well groomed. Head: Normocephalic, atraumatic. Neck is supple. Mallampati ,  neck circumference:18 inches . Nasal airflow barely patent.  Retrognathia is  seen.  Dental status: biological.  Cardiovascular:  Regular rate and cardiac rhythm by pulse,  without distended neck veins. Respiratory: Lungs are clear to auscultation.  Skin:  Witht evidence of ankle edema. Trunk: The patient's posture is erect.   Neurologic exam : The patient is  awake and alert, oriented to place and time.   Memory subjective described as intact.  Attention span & concentration ability appears normal.  Speech is fluent,  without  dysarthria, dysphonia or aphasia.  Mood and affect are appropriate.   Cranial nerves: no loss of smell or taste reported  Pupils are equal and briskly reactive to light. Funduscopic exam deferred.   Extraocular movements in vertical and horizontal planes were intact and without nystagmus.  No Diplopia. Visual fields by finger perimetry are intact. Hearing was intact to soft voice and finger rubbing.    Facial sensation intact to fine touch.  Facial motor strength is symmetric and tongue and uvula move midline.  Neck ROM : rotation, tilt and flexion extension were normal for age and shoulder shrug was symmetrical.    Motor exam:  Symmetric bulk, tone and ROM.   Normal tone without cogwheeling, symmetric grip strength . Sensory:  Fine touch and vibration were  normal.  Proprioception tested in the upper extremities was normal. Coordination: Rapid alternating movements in the fingers/hands were of normal speed.  The Finger-to-nose maneuver was intact without evidence of ataxia, dysmetria or tremor.   Gait and station: Patient could rise unassisted from a seated position, walked without assistive device.  Stance is of wider base and the patient turned with 3 steps.  Toe and heel walk were deferred.  Deep tendon reflexes: in the  upper and lower extremities are symmetric and intact.  Babinski response was deferred.       Review of recent labs: none in EPIC- last from 2015- sleep study was at [facility]- over 17 years ago.  Review of medication: 3 antihypertensives, diabetes treatment. Cholesterol statin .   After spending a total time of 45 minutes face to face and additional time for physical and neurologic examination, review of laboratory studies,  personal review of imaging studies, reports and results of other  testing and review of referral information / records as far as provided in visit, I have established the following assessments:     Mrs. Summey reports excessive daytime sleepiness, affecting her at her work but also in private life and while driving.  Her current Epworth sleepiness score was endorsed at 21 out of 24 points which is excessively high.  She also endorsed a high fatigue degree.  She does seem to have a disc metabolic syndrome including insulin resistance, diabetes, spinal central obesity with a BMI close to 55, hypertension high cholesterol there is no record of thyroid disease. 2) her risk factors for obstructive sleep apnea very high including the BMI, and the history that she was in the past helped by CPAP although this has been many years ago and may be a decade.  It is very likely that this condition is still present and the main cause for excessive daytime sleepiness.  It may also contribute to the insomnia she reports. 3) sleep paralysis and vivid dreams, sleep taking, dream intrusion.  The patient does not report cataplexy but several other features that indicate either severe sleep deprivation which is consistent with her history of getting not enough sleep for many weeks -or an underlying condition of narcolepsy that may be persistent aside from obstructive sleep apnea.   My Plan is to proceed with:  1) HLA narcolepsy panel,  2) attended sleep study preferred over HST- will petition [facility]- SPLIT study .  3) I recommend a multivitamin, with D and B 12 for better energy.  I would recommend a medical weight loss program.   I would like to thank Forrest Moron, MD and Forrest Moron, Md [phone] [place],  [place] [address] for allowing me to meet with and to take care of this pleasant patient.   In short, Darlene [name] is presenting with EDS a symptom that can be attributed to  OSA, untreated.   I plan to follow up either personally or  through our NP within 3 month.   CC: I will share my notes with PCP.  Electronically signed by: Larey Seat, MD 12/25/2019 11:41 AM  [facility] Board certified by The [employer] of Sleep Medicine and Diplomate of the [employer] of Sleep Medicine. Board certified In Neurology through the [place], Fellow of the [employer] of Neurology. Medical Director of [facility].

## 2019-12-25 NOTE — Patient Instructions (Signed)
Insomnia Insomnia is a sleep disorder that makes it difficult to fall asleep or stay asleep. Insomnia can cause fatigue, low energy, difficulty concentrating, mood swings, and poor performance at work or school. There are three different ways to classify insomnia:  Difficulty falling asleep.  Difficulty staying asleep.  Waking up too early in the morning. Any type of insomnia can be long-term (chronic) or short-term (acute). Both are common. Short-term insomnia usually lasts for three months or less. Chronic insomnia occurs at least three times a week for longer than three months. What are the causes? Insomnia may be caused by another condition, situation, or substance, such as:  Anxiety.  Certain medicines.  Gastroesophageal reflux disease (GERD) or other gastrointestinal conditions.  Asthma or other breathing conditions.  Restless legs syndrome, sleep apnea, or other sleep disorders.  Chronic pain.  Menopause.  Stroke.  Abuse of alcohol, tobacco, or illegal drugs.  Mental health conditions, such as depression.  Caffeine.  Neurological disorders, such as Alzheimer's disease.  An overactive thyroid (hyperthyroidism). Sometimes, the cause of insomnia may not be known. What increases the risk? Risk factors for insomnia include:  Gender. Women are affected more often than men.  Age. Insomnia is more common as you get older.  Stress.  Lack of exercise.  Irregular work schedule or working night shifts.  Traveling between different time zones.  Certain medical and mental health conditions. What are the signs or symptoms? If you have insomnia, the main symptom is having trouble falling asleep or having trouble staying asleep. This may lead to other symptoms, such as:  Feeling fatigued or having low energy.  Feeling nervous about going to sleep.  Not feeling rested in the morning.  Having trouble concentrating.  Feeling irritable, anxious, or depressed. How  is this diagnosed? This condition may be diagnosed based on:  Your symptoms and medical history. Your health care provider may ask about: ? Your sleep habits. ? Any medical conditions you have. ? Your mental health.  A physical exam. How is this treated? Treatment for insomnia depends on the cause. Treatment may focus on treating an underlying condition that is causing insomnia. Treatment may also include:  Medicines to help you sleep.  Counseling or therapy.  Lifestyle adjustments to help you sleep better. Follow these instructions at home: Eating and drinking   Limit or avoid alcohol, caffeinated beverages, and cigarettes, especially close to bedtime. These can disrupt your sleep.  Do not eat a large meal or eat spicy foods right before bedtime. This can lead to digestive discomfort that can make it hard for you to sleep. Sleep habits   Keep a sleep diary to help you and your health care provider figure out what could be causing your insomnia. Write down: ? When you sleep. ? When you wake up during the night. ? How well you sleep. ? How rested you feel the next day. ? Any side effects of medicines you are taking. ? What you eat and drink.  Make your bedroom a dark, comfortable place where it is easy to fall asleep. ? Put up shades or blackout curtains to block light from outside. ? Use a white noise machine to block noise. ? Keep the temperature cool.  Limit screen use before bedtime. This includes: ? Watching TV. ? Using your smartphone, tablet, or computer.  Stick to a routine that includes going to bed and waking up at the same times every day and night. This can help you fall asleep faster. Consider   making a quiet activity, such as reading, part of your nighttime routine.  Try to avoid taking naps during the day so that you sleep better at night.  Get out of bed if you are still awake after 15 minutes of trying to sleep. Keep the lights down, but try reading or  doing a quiet activity. When you feel sleepy, go back to bed. General instructions  Take over-the-counter and prescription medicines only as told by your health care provider.  Exercise regularly, as told by your health care provider. Avoid exercise starting several hours before bedtime.  Use relaxation techniques to manage stress. Ask your health care provider to suggest some techniques that may work well for you. These may include: ? Breathing exercises. ? Routines to release muscle tension. ? Visualizing peaceful scenes.  Make sure that you drive carefully. Avoid driving if you feel very sleepy.  Keep all follow-up visits as told by your health care provider. This is important. Contact a health care provider if:  You are tired throughout the day.  You have trouble in your daily routine due to sleepiness.  You continue to have sleep problems, or your sleep problems get worse. Get help right away if:  You have serious thoughts about hurting yourself or someone else. If you ever feel like you may hurt yourself or others, or have thoughts about taking your own life, get help right away. You can go to your nearest emergency department or call:  Your local emergency services (911 in the [place]  A suicide crisis helpline, such as the [place] at (630) 552-9785. This is open 24 hours a day. Summary  Insomnia is a sleep disorder that makes it difficult to fall asleep or stay asleep.  Insomnia can be long-term (chronic) or short-term (acute).  Treatment for insomnia depends on the cause. Treatment may focus on treating an underlying condition that is causing insomnia.  Keep a sleep diary to help you and your health care provider figure out what could be causing your insomnia. This information is not intended to replace advice given to you by your health care provider. Make sure you discuss any questions you have with your health care provider. Document  Revised: 01/29/2017 Document Reviewed: 11/26/2016 Elsevier Patient Education  [place]. Obesity Hypoventilation Syndrome  Obesity hypoventilation syndrome (OHS) means that you are not breathing well enough to get air in and out of your lungs efficiently (ventilation). This causes a low oxygen level and a high carbon dioxide level in your blood (hypoventilation). Having too much total body fat (obesity) is a significant risk factor for developing OHS. OHS makes it harder for your heart to pump oxygen-rich blood to your body. It can cause sleep disturbances and make you feel sleepy during the day. Over time, OHS can increase your risk for:  Heart disease.  High blood pressure (hypertension).  Reduced ability to absorb sugar from the bloodstream (insulin resistance).  Heart failure. Over time, OHS weakens your heart and can lead to heart failure. What are the causes? The exact cause of OHS is not known. Possible causes include:  Pressure on the lungs from excess body weight.  Obesity-related changes in how much air the lungs can hold (lung capacity) and how much they can expand (lung compliance).  Failure of the brain to regulate oxygen and carbon dioxide levels properly.  Chemicals (hormones) produced by excess fat cells interfering with breathing regulation.  A breathing condition in which breathing pauses or becomes shallow  during sleep (sleep apnea). This condition can eventually cause the body to ventilate poorly and to hold onto carbon dioxide during the day. What increases the risk? You may have a greater risk for OHS if you:  Have a BMI of 30 or higher. BMI is an estimate of body fat that is calculated from height and weight. For adults, a BMI of 30 or higher is considered obese.  Are 38?45 years old.  Carry most of your excess weight around your waist.  Experience moderate symptoms of sleep apnea. What are the signs or symptoms? The most common symptoms of OHS  are:  Daytime sleepiness.  Lack of energy.  Shortness of breath.  Morning headaches.  Sleep apnea.  Trouble concentrating.  Irritability, mood swings, or depression.  Swollen veins in the neck.  Swelling of the legs. How is this diagnosed? Your health care provider may suspect OHS if you are obese and have poor breathing during the day and at night. Your health care provider will also do a physical exam. You may have tests to:  Measure your BMI.  Measure your blood oxygen level with a sensor placed on your finger (pulse oximetry).  Measure blood oxygen and carbon dioxide in a blood sample.  Measure the amount of red blood cells in a blood sample. OHS causes the number of red blood cells you have to increase (polycythemia).  Check your breathing ability (pulmonary function testing).  Check your breathing ability, breathing patterns, and oxygen level while you sleep (sleep study). You may also have a chest X-ray to rule out other breathing problems. You may have an electrocardiogram (ECG) and or echocardiogram to check for signs of heart failure. How is this treated? Weight loss is the most important part of treatment for OHS, and it may be the only treatment that you need. Other treatments may include:  Using a device to open your airway while you sleep, such as a continuous positive airway pressure (CPAP) machine that delivers oxygen to your airway through a mask.  Surgery (gastric bypass surgery) to lower your BMI. This may be needed if: ? You are very obese. ? Other treatments have not worked for you. ? Your OHS is very severe and is causing organ damage, such as heart failure. Follow these instructions at home:  Medicines  Take over-the-counter and prescription medicines only as told by your health care provider.  Ask your health care provider what medicines are safe for you. You may be told to avoid medicines that can impair breathing and make OHS worse, such as  sedatives and narcotics. Sleeping habits  If you are prescribed a CPAP machine, make sure you understand and use the machine as directed.  Try to get 8 hours of sleep every night.  Go to bed at the same time every night, and get up at the same time every day. General instructions  Work with your health care provider to make a diet and exercise plan that helps you reach and maintain a healthy weight.  Eat a healthy diet.  Avoid smoking.  Exercise regularly as told by your health care provider.  During the evening, do not drink caffeine and do not eat heavy meals.  Keep all follow-up visits as told by your health care provider. This is important. Contact a health care provider if:  You experience new or worsening shortness of breath.  You have chest pain.  You have an irregular heartbeat (palpitations).  You have dizziness.  You faint.  You develop a cough.  You have a fever.  You have chest pain when you breathe (pleurisy). This information is not intended to replace advice given to you by your health care provider. Make sure you discuss any questions you have with your health care provider. Document Revised: 06/10/2018 Document Reviewed: 07/29/2015 Elsevier Patient Education  2020 [place] Sleep Information, Adult Quality sleep is important for your mental and physical health. It also improves your quality of life. Quality sleep means you:  Are asleep for most of the time you are in bed.  Fall asleep within 30 minutes.  Wake up no more than once a night.  Are awake for no longer than 20 minutes if you do wake up during the night. Most adults need 7-8 hours of quality sleep each night. How can poor sleep affect me? If you do not get enough quality sleep, you may have:  Mood swings.  Daytime sleepiness.  Confusion.  Decreased reaction time.  Sleep disorders, such as insomnia and sleep apnea.  Difficulty with: ? Solving problems. ? Coping  with stress. ? Paying attention. These issues may affect your performance and productivity at work, school, and at home. Lack of sleep may also put you at higher risk for accidents, suicide, and risky behaviors. If you do not get quality sleep you may also be at higher risk for several health problems, including:  Infections.  Type 2 diabetes.  Heart disease.  High blood pressure.  Obesity.  Worsening of long-term conditions, like arthritis, kidney disease, depression, Parkinson's disease, and epilepsy. What actions can I take to get more quality sleep?      Stick to a sleep schedule. Go to sleep and wake up at about the same time each day. Do not try to sleep less on weekdays and make up for lost sleep on weekends. This does not work.  Try to get about 30 minutes of exercise on most days. Do not exercise 2-3 hours before going to bed.  Limit naps during the day to 30 minutes or less.  Do not use any products that contain nicotine or tobacco, such as cigarettes or e-cigarettes. If you need help quitting, ask your health care provider.  Do not drink caffeinated beverages for at least 8 hours before going to bed. Coffee, tea, and some sodas contain caffeine.  Do not drink alcohol close to bedtime.  Do not eat large meals close to bedtime.  Do not take naps in the late afternoon.  Try to get at least 30 minutes of sunlight every day. Morning sunlight is best.  Make time to relax before bed. Reading, listening to music, or taking a hot bath promotes quality sleep.  Make your bedroom a place that promotes quality sleep. Keep your bedroom dark, quiet, and at a comfortable room temperature. Make sure your bed is comfortable. Take out sleep distractions like TV, a computer, smartphone, and bright lights.  If you are lying awake in bed for longer than 20 minutes, get up and do a relaxing activity until you feel sleepy.  Work with your health care provider to treat medical  conditions that may affect sleeping, such as: ? Nasal obstruction. ? Snoring. ? Sleep apnea and other sleep disorders.  Talk to your health care provider if you think any of your prescription medicines may cause you to have difficulty falling or staying asleep.  If you have sleep problems, talk with a sleep consultant. If you think you have a sleep disorder,  talk with your health care provider about getting evaluated by a specialist. Where to find more information  [place] website: https://sleepfoundation.org  National Heart, Lung, and [place] ([place]): http://www.saunders.info/.pdf  Centers for Disease Control and Prevention (CDC): LearningDermatology.pl Contact a health care provider if you:  Have trouble getting to sleep or staying asleep.  Often wake up very early in the morning and cannot get back to sleep.  Have daytime sleepiness.  Have daytime sleep attacks of suddenly falling asleep and sudden muscle weakness (narcolepsy).  Have a tingling sensation in your legs with a strong urge to move your legs (restless legs syndrome).  Stop breathing briefly during sleep (sleep apnea).  Think you have a sleep disorder or are taking a medicine that is affecting your quality of sleep. Summary  Most adults need 7-8 hours of quality sleep each night.  Getting enough quality sleep is an important part of health and well-being.  Make your bedroom a place that promotes quality sleep and avoid things that may cause you to have poor sleep, such as alcohol, caffeine, smoking, and large meals.  Talk to your health care provider if you have trouble falling asleep or staying asleep. This information is not intended to replace advice given to you by your health care provider. Make sure you discuss any questions you have with your health care provider. Document Revised: 05/26/2017 Document Reviewed: 05/26/2017 Elsevier Patient  Education  [place].

## 2019-12-28 ENCOUNTER — Telehealth: Payer: Self-pay

## 2019-12-28 NOTE — Telephone Encounter (Signed)
LVM for pt to call me back to schedule sleep study  

## 2020-01-17 ENCOUNTER — Ambulatory Visit (INDEPENDENT_AMBULATORY_CARE_PROVIDER_SITE_OTHER): Payer: 59 | Admitting: Neurology

## 2020-01-17 ENCOUNTER — Other Ambulatory Visit: Payer: Self-pay

## 2020-01-17 DIAGNOSIS — E669 Obesity, unspecified: Secondary | ICD-10-CM

## 2020-01-17 DIAGNOSIS — E662 Morbid (severe) obesity with alveolar hypoventilation: Secondary | ICD-10-CM

## 2020-01-17 DIAGNOSIS — G4733 Obstructive sleep apnea (adult) (pediatric): Secondary | ICD-10-CM

## 2020-01-17 DIAGNOSIS — G4753 Recurrent isolated sleep paralysis: Secondary | ICD-10-CM

## 2020-01-17 DIAGNOSIS — G4719 Other hypersomnia: Secondary | ICD-10-CM

## 2020-01-17 DIAGNOSIS — G4700 Insomnia, unspecified: Secondary | ICD-10-CM

## 2020-01-17 DIAGNOSIS — D500 Iron deficiency anemia secondary to blood loss (chronic): Secondary | ICD-10-CM

## 2020-01-23 ENCOUNTER — Telehealth: Payer: Self-pay | Admitting: Neurology

## 2020-01-23 DIAGNOSIS — G4753 Recurrent isolated sleep paralysis: Secondary | ICD-10-CM | POA: Insufficient documentation

## 2020-01-23 NOTE — Addendum Note (Signed)
Addended by: Larey Seat on: 01/23/2020 01:44 PM   Modules accepted: Orders

## 2020-01-23 NOTE — Progress Notes (Signed)
IMPRESSION:  1. Moderate- Severe Obstructive Sleep Apnea (OSA) at AHI 24.8/h. 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 Hypoxia was also associated. 2. Normal EKG. 3. Moderate snoring, at times loud, was noted.   RECOMMENDATIONS: REM dependent sleep apnea with hypoxia is best treated with positive airway pressure therapy.   1. Advise to start auto CPAP titration at a setting of 6-18 cm water with 3 cm EPR and mask of patient's choice, please fit in reclined position- and use heated humification for the device and heated tubing.

## 2020-01-23 NOTE — Telephone Encounter (Signed)
-----   Message from Larey Seat, MD sent at 01/23/2020  1:44 PM EST ----- IMPRESSION:  1. Moderate- Severe Obstructive Sleep Apnea (OSA) at AHI 24.8/h. 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 Hypoxia was also associated. 2. Normal EKG. 3. Moderate snoring, at times loud, was noted.   RECOMMENDATIONS: REM dependent sleep apnea with hypoxia is best treated with positive airway pressure therapy.   1. Advise to start auto CPAP titration at a setting of 6-18 cm water with 3 cm EPR and mask of patient's choice, please fit in reclined position- and use heated humification for the device and heated tubing.

## 2020-01-23 NOTE — Telephone Encounter (Signed)
I called pt. I advised pt that Dr. Brett Fairy reviewed their sleep study results and found that pt moderate to severe sleep apnea. Dr. Brett Fairy recommends that pt starts auto CPAP. I reviewed PAP compliance expectations with the pt. Pt is agreeable to starting a CPAP. I advised pt that an order will be sent to a DME, [facility] and [facility])  will call the pt within about one week after they file with the pt's insurance. [facility])  will show the pt how to use the machine, fit for masks, and troubleshoot the CPAP if needed. A follow up appt will need to be made for insurance purposes with Dr. Brett Fairy or NP. Pt verbalized understanding to call the office on the day she is scheduled to pick up her machine so we may be able to schedule the initial cpap visit 31-90 days from that date. A letter with all of this information in it will be mailed to the pt as a reminder. I verified with the pt that the address we have on file is correct. Pt verbalized understanding of results. Pt had no questions at this time but was encouraged to call back if questions arise. I have sent the order to [facility])  and have received confirmation that they have received the order.

## 2020-01-23 NOTE — Procedures (Signed)
PATIENT'S NAME:  Darlene [name], Darlene [name] DOB:      08/21/1975      MR#:    [id number]     DATE OF RECORDING: 01/17/2020 REFERRING M.D.:  Delia Chimes, MD Study Performed:   Baseline Polysomnogram HISTORY:  Darlene [name] is a 44 - year -old [place] American female patient seen here as a referral from Forrest Moron, MD. Chief concern-New sleep consult for hypersomnia, excessive daytime sleepiness.  Previous sleep study over 10 years ago diagnosed OSA.  Her CPAP machine stopped working and she has not used it since, many years.   She has a medical history of Acute bronchitis, Chest pain, Colon polyps, Depression, Diabetes mellitus without complication ([place]), Genital herpes, Hyperlipidemia, Hypertension, Insomnia, Lump or mass in breast, Morbid obesity with BMI 55 ([place]), OSA (obstructive sleep apnea), Right Osteoarthritis with knee pain, and Vitiligo.   Sleep relevant medical history: loud snorer, Nocturia 2-4 times, Sleep waking - unknown causes. Sleep talking. Night terrors- sleep paralysis, cervical spine trauma, whiplash, deviated septum, rhinitis.  Family medical /sleep history: twin sister on CPAP with OSA, some insomnia.   The patient endorsed the Epworth Sleepiness Scale at 21/24 points.   The patient's weight 320 pounds with a height of 64 (inches), resulting in a BMI of 54.6 kg/m2. The patient's neck circumference measured 18 inches.  CURRENT MEDICATIONS: Amaryl, Xyzal, Hyzaar, Mobic, Glucophage, Multivitamins, Paxil, Crestor, Aldactone   PROCEDURE:  This is a multichannel digital polysomnogram utilizing the Somnostar 11.2 system.  Electrodes and sensors were applied and monitored per AASM Specifications.   EEG, EOG, Chin and Limb EMG, were sampled at 200 Hz.  ECG, Snore and Nasal Pressure, Thermal Airflow, Respiratory Effort, CPAP Flow and Pressure, Oximetry was sampled at 50 Hz. Digital video and audio were recorded.      BASELINE STUDY: Lights Out was at 21:52 and Lights On at 04:59.   Total recording time (TRT) was 428 minutes, with a total sleep time (TST) of 249.5 minutes.   The patient's sleep latency was 142 minutes.  REM latency was 199.5 minutes.  The sleep efficiency was 58.3 %.     SLEEP ARCHITECTURE: WASO (Wake after sleep onset) was 67.5 minutes.  There were 25 minutes in Stage N1, 192 minutes Stage N2, 1.5 minutes Stage N3 and 31 minutes in Stage REM.  The percentage of Stage N1 was 10.%, Stage N2 was 77.%, Stage N3 was .6% and Stage R (REM sleep) was 12.4%.   RESPIRATORY ANALYSIS:  There were a total of 103 respiratory events:  66 obstructive apneas, 1 central apneas and 2 mixed apneas with a total of 69 apneas and an apnea index (AI) of 16.6 /hour. There were 34 hypopneas with a hypopnea index of 8.2 /hour.     The total APNEA/HYPOPNEA INDEX (AHI) was 24.8/hour.  40 events occurred in REM sleep and 71 events in NREM. The REM AHI was  77.4 /hour, versus a non-REM AHI of 17.3. The patient spent 47.5 minutes of total sleep time in the supine position and 202 minutes in non-supine. The supine AHI was 36.7/h versus a non-supine AHI of 22.0/h.  OXYGEN SATURATION & C02:  The Wake baseline 02 saturation was 94%, with the lowest being 76%. Time spent below 89% saturation equaled 24 minutes.  The arousals were noted as: 61 were spontaneous, 0 were associated with PLMs, 44 were associated with respiratory events. The patient had a total of 0 Periodic Limb Movements.   Audio and video analysis did  not show any abnormal or unusual movements, behaviors, phonations or vocalizations.  Snoring was noted. EKG was in keeping with normal sinus rhythm (NSR).  IMPRESSION:  1. Moderate- Severe Obstructive Sleep Apnea (OSA) at AHI 24.8/h. 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  2. Normal EKG. 3. Moderate snoring, at times loud, was noted.   RECOMMENDATIONS:  1. Advise to start auto CPAP titration at a setting of 6-18 cm water with 3 cm EPR and mask of patient's choice, please fit in  reclined position- and use heated humification for the device and heated tubing.   I certify that I have reviewed the entire raw data recording prior to the issuance of this report in accordance with the Standards of Accreditation of the American Academy of Sleep Medicine (AASM)     Larey Seat, MD Diplomat, American Board of Psychiatry and Neurology  Diplomat, American Board of Sleep Medicine Market researcher, [place] [facility] at [employer]

## 2020-02-09 DIAGNOSIS — M179 Osteoarthritis of knee, unspecified: Secondary | ICD-10-CM | POA: Insufficient documentation

## 2020-05-08 ENCOUNTER — Telehealth: Payer: Self-pay | Admitting: Neurology

## 2020-05-08 NOTE — Telephone Encounter (Signed)
FYI-Pt called wanting to inform RN and Provider that she has not received her cpap machine yet. She is still waiting on the DME to call her when they have some available.

## 2020-05-08 NOTE — Telephone Encounter (Signed)
I have contacted [facility] for the patient to check on status for her.  I have advised that someone reach out and update her on the status.  Unfortunately this is not uncommon that you are still on hold with getting set up due to the nationwide shortage.  Patient can also contact the company [phone].

## 2020-09-03 ENCOUNTER — Other Ambulatory Visit: Payer: Self-pay | Admitting: Gastroenterology

## 2020-09-16 NOTE — Progress Notes (Signed)
Attempted to obtain medical history via telephone, unable to reach at this time. I left a voicemail to return pre surgical testing department's phone call.  

## 2020-09-17 ENCOUNTER — Encounter (HOSPITAL_COMMUNITY): Payer: Self-pay | Admitting: Gastroenterology

## 2020-09-17 ENCOUNTER — Other Ambulatory Visit: Payer: Self-pay

## 2020-09-20 ENCOUNTER — Encounter (HOSPITAL_COMMUNITY): Payer: Self-pay | Admitting: Gastroenterology

## 2020-09-20 ENCOUNTER — Ambulatory Visit (HOSPITAL_COMMUNITY): Payer: 59 | Admitting: Certified Registered Nurse Anesthetist

## 2020-09-20 ENCOUNTER — Ambulatory Visit (HOSPITAL_COMMUNITY)
Admission: RE | Admit: 2020-09-20 | Discharge: 2020-09-20 | Disposition: A | Discharge status: Home / Self Care | Payer: 59 | Attending: Gastroenterology | Admitting: Gastroenterology

## 2020-09-20 ENCOUNTER — Encounter (HOSPITAL_COMMUNITY)
Admission: RE | Disposition: A | Discharge status: Home / Self Care | Payer: Self-pay | Source: Home / Self Care | Attending: Gastroenterology

## 2020-09-20 DIAGNOSIS — I10 Essential (primary) hypertension: Secondary | ICD-10-CM | POA: Diagnosis not present

## 2020-09-20 DIAGNOSIS — Z1211 Encounter for screening for malignant neoplasm of colon: Secondary | ICD-10-CM | POA: Diagnosis present

## 2020-09-20 DIAGNOSIS — Z86010 Personal history of colonic polyps: Secondary | ICD-10-CM | POA: Insufficient documentation

## 2020-09-20 HISTORY — DX: Other allergic rhinitis: J30.89

## 2020-09-20 HISTORY — PX: COLONOSCOPY WITH PROPOFOL: SHX5780

## 2020-09-20 LAB — GLUCOSE, CAPILLARY: Glucose-Capillary: 190 mg/dL — ABNORMAL HIGH (ref 70–99)

## 2020-09-20 LAB — PREGNANCY, URINE: Preg Test, Ur: NEGATIVE

## 2020-09-20 SURGERY — COLONOSCOPY WITH PROPOFOL
Anesthesia: Monitor Anesthesia Care

## 2020-09-20 MED ORDER — LACTATED RINGERS IV SOLN: INTRAVENOUS | Status: DC

## 2020-09-20 MED ORDER — PROPOFOL 500 MG/50ML IV EMUL
INTRAVENOUS | Status: DC | PRN
  Administered 2020-09-20: 125 ug/kg/min via INTRAVENOUS

## 2020-09-20 MED ORDER — ONDANSETRON HCL 4 MG/2ML IJ SOLN
INTRAMUSCULAR | Status: DC | PRN
  Administered 2020-09-20: 4 mg via INTRAVENOUS

## 2020-09-20 MED ORDER — SODIUM CHLORIDE 0.9 % IV SOLN: INTRAVENOUS | Status: DC

## 2020-09-20 SURGICAL SUPPLY — 22 items

## 2020-09-20 NOTE — Anesthesia Postprocedure Evaluation (Signed)
Anesthesia Post Note  Patient: Darlene [name]  Procedure(s) Performed: COLONOSCOPY WITH PROPOFOL     Patient location during evaluation: Endoscopy Anesthesia Type: MAC Level of consciousness: awake and alert, patient cooperative and oriented Pain management: pain level controlled Vital Signs Assessment: post-procedure vital signs reviewed and stable Respiratory status: spontaneous breathing, nonlabored ventilation and respiratory function stable Cardiovascular status: blood pressure returned to baseline and stable Postop Assessment: no apparent nausea or vomiting Anesthetic complications: no   No notable events documented.  Last Vitals:  Vitals:   09/20/20 1040 09/20/20 1050  BP: 109/65 (!) 109/56  Pulse:    Resp: 18 19  Temp:    SpO2: 99% 99%    Last Pain:  Vitals:   09/20/20 1050  TempSrc:   PainSc: 0-No pain                 Kitty Cadavid,E. Aydian Dimmick

## 2020-09-20 NOTE — H&P (Signed)
  Darlene [name] HPI: At this time the patient denies any problems with nausea, vomiting, fevers, chills, abdominal pain, diarrhea, constipation, hematochezia, melena, GERD, or dysphagia. The patient denies any known family history of colon cancers. No complaints of chest pain, SOB, MI, or sleep apnea.  At the age of 79 she had a colonoscopy for some hematochezia.  Her procedure was with Dr. Fuller Plan on 04/12/2009 and she was identified to have three hyperplastic polyps with melanosis coli.  Her most recent A1C was in the 7% range.  She is going to bereferred to an Musician.   Past Medical History:  Diagnosis Date   Acute bronchitis    Chest pain    Colon polyps    Depression    Diabetes mellitus without complication ([place])    Environmental and seasonal allergies    Genital herpes    Hyperlipidemia    Hypertension    Insomnia    Lump or mass in breast    Morbid obesity (HCC)    OSA (obstructive sleep apnea)    Right knee pain    Vaginitis    Vitiligo     Past Surgical History:  Procedure Laterality Date   COLONOSCOPY     WISDOM TOOTH EXTRACTION      Family History  Problem Relation Age of Onset   Breast cancer Mother    Ovarian cysts Mother    Hypertension Mother    Diabetes Mother    Stroke Father    Diabetes Father    Cancer Father        bone marrow    Social History:  reports that she has never smoked. She has never used smokeless tobacco. She reports current alcohol use. She reports that she does not use drugs.  Allergies:  Allergies  Allergen Reactions   Yeast-Related Products Other (See Comments)    Per allergy test   Apple Rash   Latex Rash   Rice Rash   Tomato Rash and Other (See Comments)    Itchy throat (rash around mouth)    Medications: Scheduled: Continuous:  sodium chloride     lactated ringers      No results found for this or any previous visit (from the past 24 hour(s)).   No results found.  ROS:  As stated above in the HPI  otherwise negative.  Blood pressure (!) 133/59, pulse 76, temperature 99 F (37.2 C), temperature source Oral, resp. rate 17, height '5\' 5"'$  (1.651 m), weight (!) 145.6 kg, last menstrual period 08/12/2020, SpO2 97 %.    PE: Gen: NAD, Alert and Oriented HEENT:  [place]/AT, EOMI Neck: Supple, no LAD Lungs: CTA Bilaterally CV: RRR without M/G/R ABD: Soft, NTND, +BS Ext: No C/C/E  Assessment/Plan: 1) Screening colonoscopy.  Dabid Godown D 09/20/2020, 9:03 AM

## 2020-09-20 NOTE — Anesthesia Preprocedure Evaluation (Addendum)
Anesthesia Evaluation  Patient identified by MRN, date of birth, ID band Patient awake    Reviewed: Allergy & Precautions, NPO status , Patient's Chart, lab work & pertinent test results  History of Anesthesia Complications Negative for: history of anesthetic complications  Airway Mallampati: II  TM Distance: >3 FB Neck ROM: Full    Dental  (+) Dental Advisory Given   Pulmonary sleep apnea and Continuous Positive Airway Pressure Ventilation ,    breath sounds clear to auscultation       Cardiovascular hypertension, Pt. on medications (-) angina Rhythm:Regular Rate:Normal     Neuro/Psych  Headaches, Depression    GI/Hepatic Neg liver ROS, GERD  Controlled,  Endo/Other  diabetes (glu 190), Oral Hypoglycemic AgentsMorbid obesity  Renal/GU negative Renal ROS     Musculoskeletal   Abdominal (+) + obese,   Peds  Hematology   Anesthesia Other Findings   Reproductive/Obstetrics                            Anesthesia Physical Anesthesia Plan  ASA: 3  Anesthesia Plan: MAC   Post-op Pain Management:    Induction:   PONV Risk Score and Plan: 2 and Ondansetron  Airway Management Planned: Natural Airway and Simple Face Mask  Additional Equipment: None  Intra-op Plan:   Post-operative Plan:   Informed Consent: I have reviewed the patients History and Physical, chart, labs and discussed the procedure including the risks, benefits and alternatives for the proposed anesthesia with the patient or authorized representative who has indicated his/her understanding and acceptance.     Dental advisory given  Plan Discussed with: CRNA and Surgeon  Anesthesia Plan Comments:        Anesthesia Quick Evaluation

## 2020-09-20 NOTE — Anesthesia Procedure Notes (Signed)
Procedure Name: MAC Date/Time: 09/20/2020 10:05 AM Performed by: Jari Pigg, CRNA Pre-anesthesia Checklist: Patient identified, Emergency Drugs available, Suction available and Patient being monitored Patient Re-evaluated:Patient Re-evaluated prior to induction Oxygen Delivery Method: Simple face mask

## 2020-09-20 NOTE — Op Note (Signed)
[facility] Patient Name: Darlene [name] Procedure Date: 09/20/2020 MRN: [id number] Attending MD: Carol Ada , MD Date of Birth: 1975/05/06 CSN: [id number] Age: 45 Admit Type: Outpatient Procedure:                Colonoscopy Indications:              Screening for colorectal malignant neoplasm Providers:                Carol Ada, MD, Terrall Laity, Kary Kos RN,                            RN, Tyna Jaksch Technician Referring MD:              Medicines:                Propofol per Anesthesia Complications:            No immediate complications. Estimated Blood Loss:     Estimated blood loss: none. Procedure:                Pre-Anesthesia Assessment:                           - Prior to the procedure, a History and Physical                            was performed, and patient medications and                            allergies were reviewed. The patient's tolerance of                            previous anesthesia was also reviewed. The risks                            and benefits of the procedure and the sedation                            options and risks were discussed with the patient.                            All questions were answered, and informed consent                            was obtained. Prior Anticoagulants: The patient has                            taken no previous anticoagulant or antiplatelet                            agents. ASA Grade Assessment: III - A patient with                            severe systemic disease. After reviewing the risks  and benefits, the patient was deemed in                            satisfactory condition to undergo the procedure.                           - Sedation was administered by an anesthesia                            professional. Deep sedation was attained.                           After obtaining informed consent, the colonoscope                            was  passed under direct vision. Throughout the                            procedure, the patient's blood pressure, pulse, and                            oxygen saturations were monitored continuously. The                            [id number]) Olympus colonoscope was                            introduced through the anus and advanced to the the                            cecum, identified by appendiceal orifice and                            ileocecal valve. The colonoscopy was technically                            difficult and complex due to the patient's oxygen                            desaturation. Successful completion of the                            procedure was aided by performing chin lift and                            administering oxygen. The patient tolerated the                            procedure well. The quality of the bowel                            preparation was excellent. The ileocecal valve,  appendiceal orifice, and rectum were photographed. Scope In: 10:10:41 AM Scope Out: 10:21:44 AM Scope Withdrawal Time: 0 hours 6 minutes 24 seconds  Total Procedure Duration: 0 hours 11 minutes 3 seconds  Findings:      The entire examined colon appeared normal. Impression:               - The entire examined colon is normal.                           - No specimens collected. Moderate Sedation:      Not Applicable - Patient had care per Anesthesia. Recommendation:           - Patient has a contact number available for                            emergencies. The signs and symptoms of potential                            delayed complications were discussed with the                            patient. Return to normal activities tomorrow.                            Written discharge instructions were provided to the                            patient.                           - Resume previous diet.                           - Continue  present medications.                           - Repeat colonoscopy in 10 years for screening                            purposes. Procedure Code(s):        --- Professional ---                           [phone], Colonoscopy, flexible; diagnostic, including                            collection of specimen(s) by brushing or washing,                            when performed (separate procedure) Diagnosis Code(s):        --- Professional ---                           Z12.11, Encounter for screening for malignant                            neoplasm of colon CPT  copyright 2019 [employer]. All rights reserved. The codes documented in this report are preliminary and upon coder review may  be revised to meet current compliance requirements. Carol Ada, MD Carol Ada, MD 09/20/2020 10:25:44 AM This report has been signed electronically. Number of Addenda: 0

## 2020-09-20 NOTE — Transfer of Care (Signed)
Immediate Anesthesia Transfer of Care Note  Patient: Lavera E Steege  Procedure(s) Performed: COLONOSCOPY WITH PROPOFOL  Patient Location: Endoscopy Unit  Anesthesia Type:MAC  Level of Consciousness: awake, alert , oriented and patient cooperative  Airway & Oxygen Therapy: Patient Spontanous Breathing and Patient connected to face mask oxygen  Post-op Assessment: Report given to RN and Post -op Vital signs reviewed and stable  Post vital signs: Reviewed and stable  Last Vitals:  Vitals Value Taken Time  BP 186/106 09/20/20 1030  Temp 37.2 C 09/20/20 1030  Pulse    Resp 16 09/20/20 1030  SpO2 94 % 09/20/20 1030    Last Pain:  Vitals:   09/20/20 1030  TempSrc: Oral  PainSc: 0-No pain         Complications: No notable events documented.

## 2020-09-24 ENCOUNTER — Encounter (HOSPITAL_COMMUNITY): Payer: Self-pay | Admitting: Gastroenterology

## 2020-10-09 ENCOUNTER — Ambulatory Visit: Payer: 59 | Admitting: Neurology

## 2021-06-30 IMAGING — CR DG KNEE 1-2V*L*
2 series · 2 of 2 positions shown · non-contrast
Comparison: None.

CLINICAL DATA: Chronic left knee pain.  No known injury.

EXAM:
LEFT KNEE - 1-2 VIEW

[w knee ap left *]
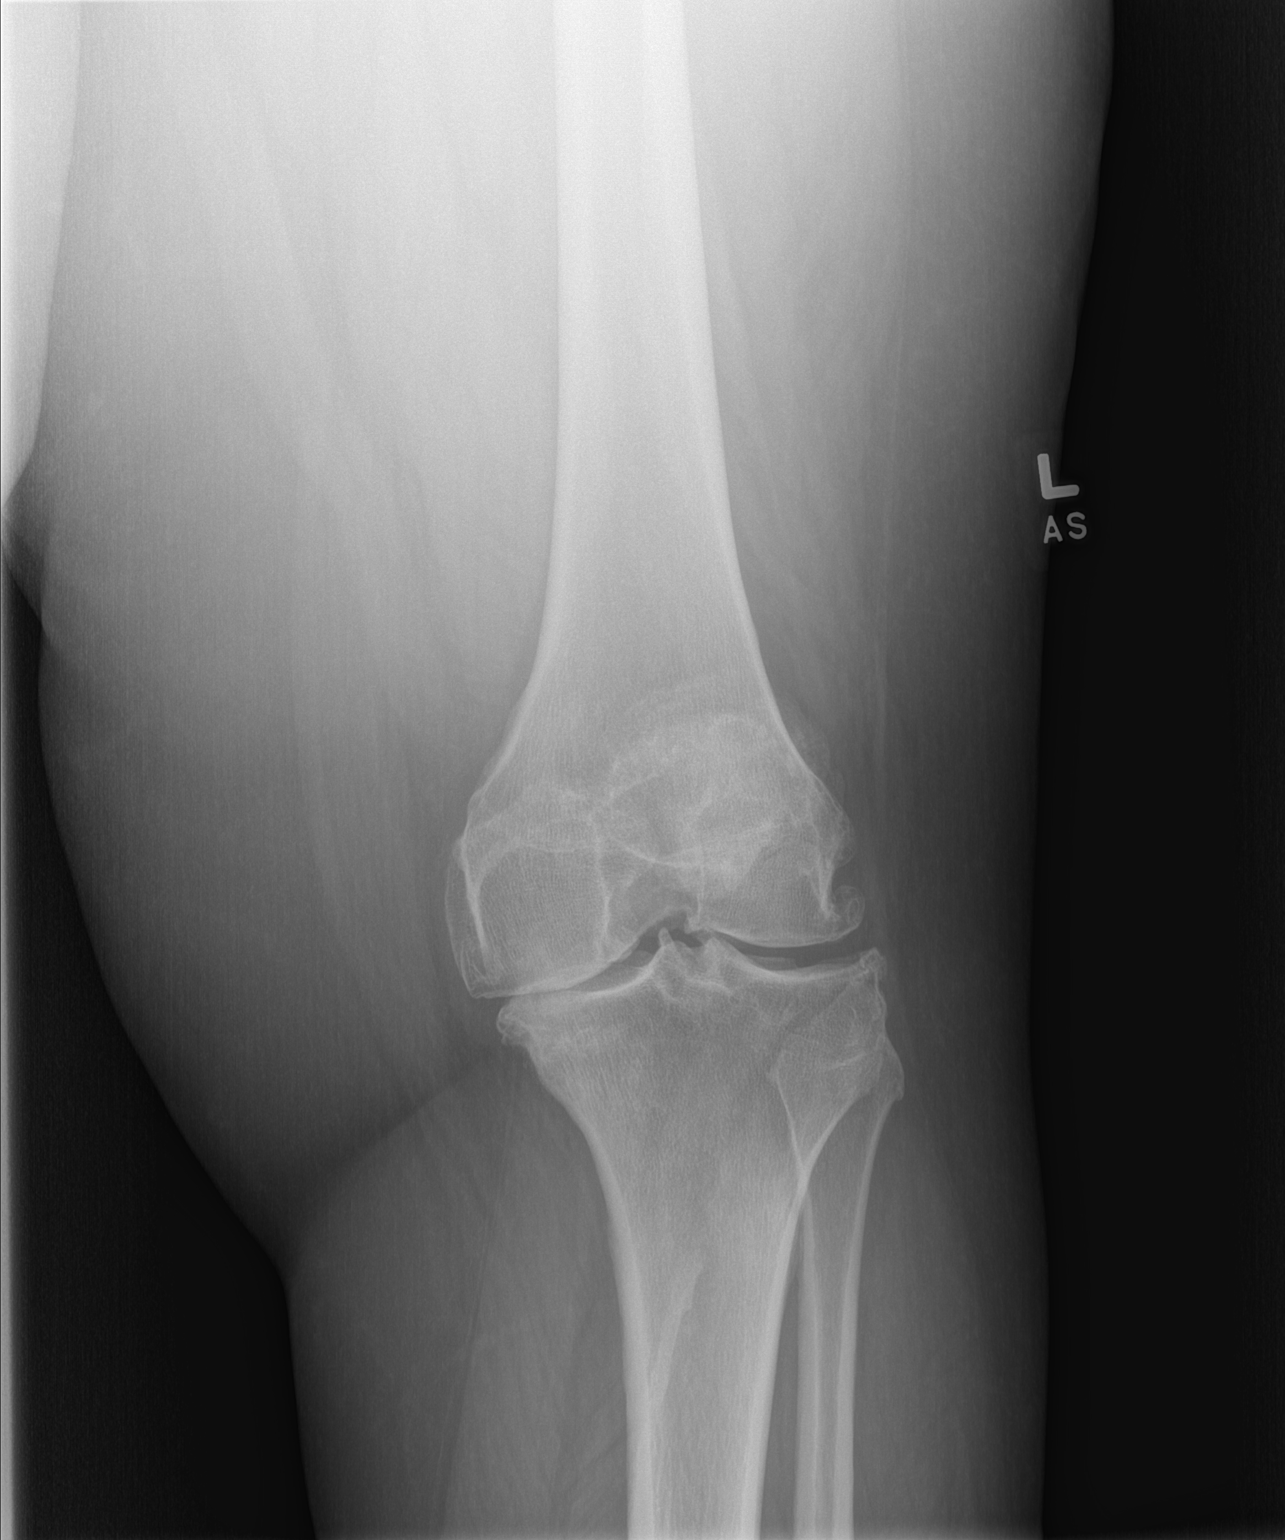

[w knee lat. left *]
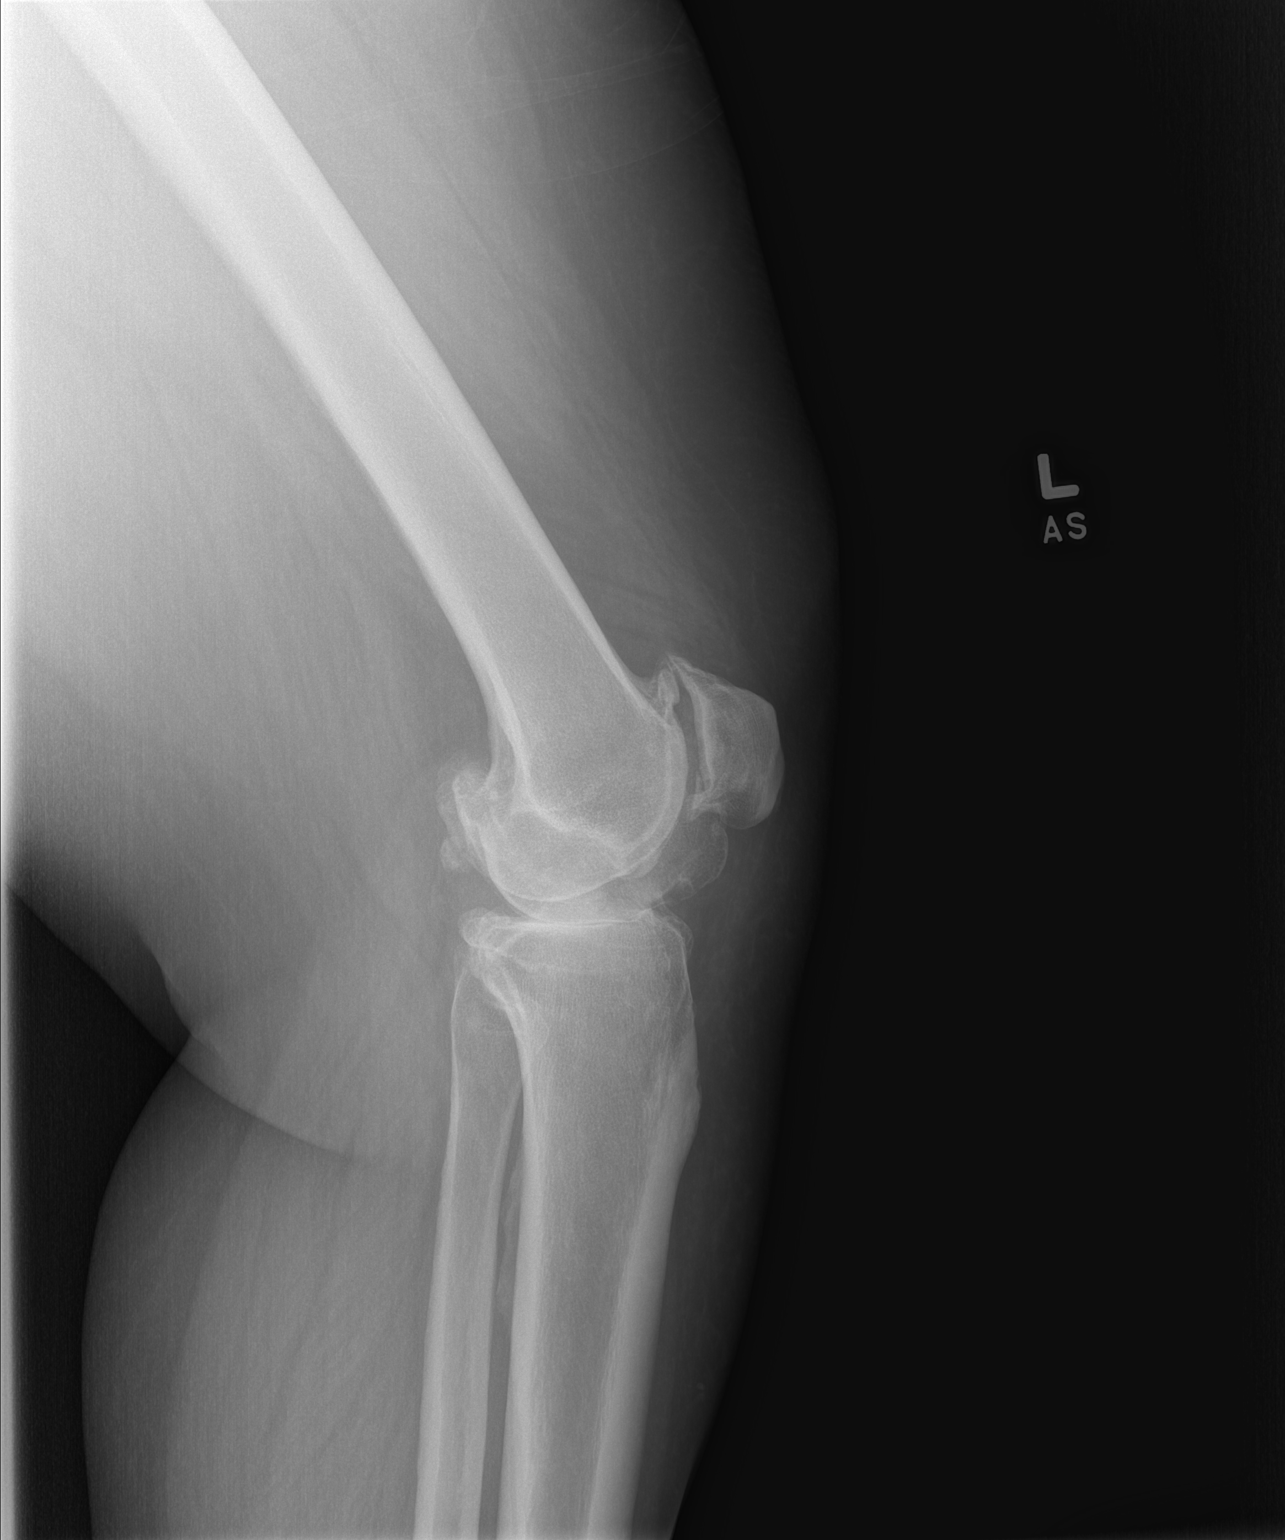

[2 of 2 positions shown; findings below may reference images not displayed]

FINDINGS: Imaging obtained standing. Advanced tricompartmental degenerative
change, advanced for age. There is near complete joint space loss in
the medial tibiofemoral compartment. Large tricompartmental
peripheral spurs. Slight lateral translation of the tibia with
respect to the distal femur. No significant joint effusion. No
erosion or bony destruction.
IMPRESSION: Moderately advanced tricompartmental osteoarthritis, most prominent
in the medial tibiofemoral compartment where there is near complete
joint space loss.

## 2021-08-21 ENCOUNTER — Encounter: Payer: Self-pay | Admitting: Internal Medicine

## 2021-08-21 NOTE — Patient Instructions (Signed)
     Blood work was ordered.     Medications changes include :   none   Your prescription(s) have been sent to your pharmacy.    A referral was ordered for [facility] for a colonoscopy.     Someone from that office will call you to schedule an appointment.    Return in about 6 months (around 04/02/2022) for Physical Exam.   [facility] Phone: [phone]  

## 2021-08-21 NOTE — Progress Notes (Signed)
Subjective:    Patient ID: Darlene [name], female    DOB: Mar 09, 1975, 46 y.o.   MRN: [id number]     HPI She is here to establish with a new pcp.  Darlene [name] is here for follow up of her chronic medical problems, including htn, diabetes, hld, mod-sev OSA, depression  Having joint pain. Needs to lose weight-she knows that would help.  Not trying to avoid pregnancy.  Her and her boyfriend do not live in the same city and they are not using protection, but she has not gotten pregnant, but is not trying to avoid it.  Hears intermittent swooshing in ears - improved - ? Related to bp  Medications and allergies reviewed with patient and updated if appropriate.  Current Outpatient Medications on File Prior to Visit  Medication Sig Dispense Refill   medroxyPROGESTERone (PROVERA) 10 MG tablet Take 10 mg by mouth See admin instructions. Take 1 tablet (10 mg) by mouth for 10 days out of the month     No current facility-administered medications on file prior to visit.     Review of Systems  Constitutional:  Negative for fever.  Respiratory:  Positive for cough (allergy related). Negative for shortness of breath and wheezing.   Cardiovascular:  Positive for leg swelling. Negative for chest pain and palpitations.  Gastrointestinal:        Randa Evens  Neurological:  Positive for light-headedness (occ). Negative for headaches.       Objective:   Vitals:   08/22/21 1431  BP: 124/80  Pulse: 68  Temp: 98.6 F (37 C)  SpO2: 97%   BP Readings from Last 3 Encounters:  08/22/21 124/80  09/20/20 (!) 109/56  12/25/19 107/71   Wt Readings from Last 3 Encounters:  08/22/21 (!) 324 lb (147 kg)  09/20/20 (!) 320 lb 15.8 oz (145.6 kg)  12/25/19 (!) 320 lb (145.2 kg)   Body mass index is 53.92 kg/m.    Physical Exam Constitutional:      General: She is not in acute distress.    Appearance: Normal appearance.  HENT:     Head: Normocephalic and atraumatic.  Eyes:      Conjunctiva/sclera: Conjunctivae normal.  Cardiovascular:     Rate and Rhythm: Normal rate and regular rhythm.     Heart sounds: Normal heart sounds. No murmur heard. Pulmonary:     Effort: Pulmonary effort is normal. No respiratory distress.     Breath sounds: Normal breath sounds. No wheezing.  Musculoskeletal:     Cervical back: Neck supple.     Right lower leg: No edema.     Left lower leg: No edema.  Lymphadenopathy:     Cervical: No cervical adenopathy.  Skin:    General: Skin is warm and dry.     Findings: No rash.  Neurological:     Mental Status: She is alert. Mental status is at baseline.  Psychiatric:        Mood and Affect: Mood normal.        Behavior: Behavior normal.        Lab Results  Component Value Date   WBC 8.3 08/22/2021   HGB 13.0 08/22/2021   HCT 39.9 08/22/2021   PLT 279.0 08/22/2021   GLUCOSE 201 (H) 08/22/2021   CHOL 121 08/22/2021   TRIG 108.0 08/22/2021   HDL 45.50 08/22/2021   LDLDIRECT 148.6 02/13/2009   LDLCALC 54 08/22/2021   ALT 21 08/22/2021   AST 19 08/22/2021  NA 140 08/22/2021   K 3.8 08/22/2021   CL 100 08/22/2021   CREATININE 0.91 08/22/2021   BUN 12 08/22/2021   CO2 32 08/22/2021   TSH 1.33 02/13/2009   HGBA1C 8.3 (H) 08/22/2021   MICROALBUR 1.4 08/22/2021     Assessment & Plan:    See Problem List for Assessment and Plan of chronic medical problems.

## 2021-08-22 ENCOUNTER — Ambulatory Visit (INDEPENDENT_AMBULATORY_CARE_PROVIDER_SITE_OTHER): Payer: Commercial Managed Care - HMO | Admitting: Internal Medicine

## 2021-08-22 ENCOUNTER — Encounter: Payer: Self-pay | Admitting: Internal Medicine

## 2021-08-22 VITALS — BP 124/80 | HR 68 | Temp 98.6°F | Ht 65.0 in | Wt 324.0 lb

## 2021-08-22 DIAGNOSIS — N926 Irregular menstruation, unspecified: Secondary | ICD-10-CM

## 2021-08-22 DIAGNOSIS — I10 Essential (primary) hypertension: Secondary | ICD-10-CM

## 2021-08-22 DIAGNOSIS — G4733 Obstructive sleep apnea (adult) (pediatric): Secondary | ICD-10-CM | POA: Diagnosis not present

## 2021-08-22 DIAGNOSIS — E1169 Type 2 diabetes mellitus with other specified complication: Secondary | ICD-10-CM | POA: Insufficient documentation

## 2021-08-22 DIAGNOSIS — E782 Mixed hyperlipidemia: Secondary | ICD-10-CM

## 2021-08-22 DIAGNOSIS — E119 Type 2 diabetes mellitus without complications: Secondary | ICD-10-CM

## 2021-08-22 DIAGNOSIS — F3289 Other specified depressive episodes: Secondary | ICD-10-CM | POA: Diagnosis not present

## 2021-08-22 LAB — LIPID PANEL
Cholesterol: 121 mg/dL (ref 0–200)
HDL: 45.5 mg/dL (ref >39.00)
LDL Cholesterol: 54 mg/dL (ref 0–99)
NonHDL: 75.22
Total CHOL/HDL Ratio: 3
Triglycerides: 108 mg/dL (ref 0.0–149.0)
VLDL: 21.6 mg/dL (ref 0.0–40.0)

## 2021-08-22 LAB — COMPREHENSIVE METABOLIC PANEL
ALT: 21 U/L (ref 0–35)
AST: 19 U/L (ref 0–37)
Albumin: 4.1 g/dL (ref 3.5–5.2)
Alkaline Phosphatase: 64 U/L (ref 39–117)
BUN: 12 mg/dL (ref 6–23)
CO2: 32 mEq/L (ref 19–32)
Calcium: 9.9 mg/dL (ref 8.4–10.5)
Chloride: 100 mEq/L (ref 96–112)
Creatinine, Ser: 0.91 mg/dL (ref 0.40–1.20)
GFR: 75.37 mL/min (ref >60.00)
Glucose, Bld: 201 mg/dL — ABNORMAL HIGH (ref 70–99)
Potassium: 3.8 mEq/L (ref 3.5–5.1)
Sodium: 140 mEq/L (ref 135–145)
Total Bilirubin: 0.3 mg/dL (ref 0.2–1.2)
Total Protein: 7.4 g/dL (ref 6.0–8.3)

## 2021-08-22 LAB — CBC WITH DIFFERENTIAL/PLATELET
Basophils Absolute: 0.1 10*3/uL (ref 0.0–0.1)
Basophils Relative: 0.7 % (ref 0.0–3.0)
Eosinophils Absolute: 0.1 10*3/uL (ref 0.0–0.7)
Eosinophils Relative: 1.4 % (ref 0.0–5.0)
HCT: 39.9 % (ref 36.0–46.0)
Hemoglobin: 13 g/dL (ref 12.0–15.0)
Lymphocytes Relative: 31.4 % (ref 12.0–46.0)
Lymphs Abs: 2.6 10*3/uL (ref 0.7–4.0)
MCHC: 32.7 g/dL (ref 30.0–36.0)
MCV: 89.7 fl (ref 78.0–100.0)
Monocytes Absolute: 0.4 10*3/uL (ref 0.1–1.0)
Monocytes Relative: 5 % (ref 3.0–12.0)
Neutro Abs: 5.1 10*3/uL (ref 1.4–7.7)
Neutrophils Relative %: 61.5 % (ref 43.0–77.0)
Platelets: 279 10*3/uL (ref 150.0–400.0)
RBC: 4.45 Mil/uL (ref 3.87–5.11)
RDW: 13.5 % (ref 11.5–15.5)
WBC: 8.3 10*3/uL (ref 4.0–10.5)

## 2021-08-22 LAB — HEMOGLOBIN A1C: Hgb A1c MFr Bld: 8.3 % — ABNORMAL HIGH (ref 4.6–6.5)

## 2021-08-22 LAB — MICROALBUMIN / CREATININE URINE RATIO
Creatinine,U: 216.6 mg/dL
Microalb Creat Ratio: 0.7 mg/g (ref 0.0–30.0)
Microalb, Ur: 1.4 mg/dL (ref 0.0–1.9)

## 2021-08-22 MED ORDER — AMLODIPINE BESYLATE 5 MG PO TABS: 5.0000 mg | ORAL_TABLET | Freq: Every morning | ORAL | 3 refills | Status: DC

## 2021-08-22 MED ORDER — SPIRONOLACTONE 50 MG PO TABS: 50.0000 mg | ORAL_TABLET | Freq: Every morning | ORAL | 3 refills | Status: DC

## 2021-08-22 MED ORDER — PAROXETINE HCL 30 MG PO TABS: 30.0000 mg | ORAL_TABLET | Freq: Every morning | ORAL | 3 refills | Status: DC

## 2021-08-22 MED ORDER — LEVOCETIRIZINE DIHYDROCHLORIDE 5 MG PO TABS: 5.0000 mg | ORAL_TABLET | Freq: Every day | ORAL | 3 refills | Status: DC | PRN

## 2021-08-22 MED ORDER — ROSUVASTATIN CALCIUM 10 MG PO TABS
10.0000 mg | ORAL_TABLET | Freq: Every morning | ORAL | 3 refills | Status: DC
Start: 2021-08-22 — End: 2023-04-12

## 2021-08-22 MED ORDER — GLIMEPIRIDE 2 MG PO TABS: 2.0000 mg | ORAL_TABLET | Freq: Every morning | ORAL | 3 refills | Status: DC

## 2021-08-22 MED ORDER — MELOXICAM 15 MG PO TABS: 15.0000 mg | ORAL_TABLET | Freq: Every morning | ORAL | 3 refills | Status: DC

## 2021-08-22 MED ORDER — METFORMIN HCL 1000 MG PO TABS: 1000.0000 mg | ORAL_TABLET | Freq: Two times a day (BID) | ORAL | 3 refills | Status: DC

## 2021-08-22 MED ORDER — LOSARTAN POTASSIUM-HCTZ 100-25 MG PO TABS: 1.0000 | ORAL_TABLET | Freq: Every morning | ORAL | 3 refills | Status: DC

## 2021-08-22 NOTE — Assessment & Plan Note (Signed)
Chronic Does not use cpap - not tolerated

## 2021-10-07 ENCOUNTER — Ambulatory Visit: Payer: Commercial Managed Care - HMO | Attending: Obstetrics and Gynecology | Admitting: Physical Therapy

## 2021-10-07 ENCOUNTER — Other Ambulatory Visit: Payer: Self-pay

## 2021-10-07 DIAGNOSIS — M6281 Muscle weakness (generalized): Secondary | ICD-10-CM | POA: Diagnosis present

## 2021-10-07 DIAGNOSIS — R279 Unspecified lack of coordination: Secondary | ICD-10-CM | POA: Diagnosis present

## 2021-10-07 DIAGNOSIS — R293 Abnormal posture: Secondary | ICD-10-CM | POA: Diagnosis present

## 2021-10-07 NOTE — Patient Instructions (Signed)

## 2021-10-07 NOTE — Therapy (Signed)
OUTPATIENT PHYSICAL THERAPY FEMALE PELVIC EVALUATION   Patient Name: Darlene [name] MRN: [id number] DOB:September 25, 1975, 46 y.o., female Today's Date: 10/07/2021   PT End of Session - 10/07/21 1451     Visit Number 1    Date for PT Re-Evaluation 01/07/22    Authorization Type [employer] [place] [employer]    PT Start Time 3704    PT Stop Time 1552    PT Time Calculation (min) 47 min    Activity Tolerance Patient tolerated treatment well    Behavior During Therapy WFL for tasks assessed/performed             Past Medical History:  Diagnosis Date   Acute bronchitis    Chest pain    Colon polyps    Depression    Diabetes mellitus without complication ([place])    Environmental and seasonal allergies    Genital herpes    Hyperlipidemia    Hypertension    Insomnia    Lump or mass in breast    Morbid obesity ([place])    OSA (obstructive sleep apnea)    Right knee pain    Vaginitis    Vitiligo    Past Surgical History:  Procedure Laterality Date   COLONOSCOPY     COLONOSCOPY WITH PROPOFOL N/A 09/20/2020   Procedure: COLONOSCOPY WITH PROPOFOL;  Surgeon: Carol Ada, MD;  Location: [facility];  Service: Endoscopy;  Laterality: N/A;   WISDOM TOOTH EXTRACTION     Patient Active Problem List   Diagnosis Date Noted   Diabetes ([place]) 08/22/2021   Recurrent isolated sleep paralysis 01/23/2020   Hypoventilation associated with obesity syndrome ([place]) 12/25/2019   Excessive daytime sleepiness 12/25/2019   Irregular periods 11/20/2019   Polycystic ovary syndrome 11/20/2019   HYPERGLYCEMIA 11/29/2009   EDEMA 11/27/2009   GERD 04/01/2009   BLOOD IN STOOL 04/01/2009   OBSTRUCTIVE SLEEP APNEA 03/27/2009   UNSPECIFIED HEARING LOSS 01/29/2009   GENITAL HERPES, HX OF 01/29/2009   KNEE PAIN, RIGHT 04/16/2008   OBESITY, MORBID 11/17/2006   ABDOMINAL CRAMPS 11/17/2006   Essential hypertension 08/30/2006   Hyperlipidemia 07/20/2006   ANEMIA-NOS 07/20/2006   Depression 07/20/2006   VITILIGO 07/20/2006    INSOMNIA 07/20/2006    PCP: Binnie Rail, MD  REFERRING PROVIDER: Janyth Contes, MD  REFERRING DIAG: N39.46 (ICD-10-CM) - Mixed incontinence  THERAPY DIAG:  Muscle weakness (generalized)  Unspecified lack of coordination  Abnormal posture  Rationale for Evaluation and Treatment Rehabilitation  ONSET DATE: years, progressively getting worse  SUBJECTIVE:  SUBJECTIVE STATEMENT: Pt reports she has urinary incontinence with urge to urinate, coughing/sneezing. Usually moderate leakage noted now, has worsened to now once passed a certain point can't stop urinate at all. Pt also reports she doesn't have small urges anymore, feels like all are very strong and intense. Wears full pads daily for "security" usually one per day. Also the strength of urgency does not match amount of urine during voids, will have strong urgency and moderate or smaller voids.   Fluid intake: Yes: does limit water intake due to fear of leakage;      PAIN:  Are you having pain? No   PRECAUTIONS: None  WEIGHT BEARING RESTRICTIONS No  FALLS:  Has patient fallen in last 6 months? No  LIVING ENVIRONMENT: Lives with: lives with their family Lives in: House/apartment   OCCUPATION: medical transportation   PLOF: Independent  PATIENT GOALS to have less leakage  PERTINENT HISTORY:  htn, diabetes, hld, mod-sev OSA, depression Sexual abuse: No  BOWEL MOVEMENT Pain with bowel movement: No Type of bowel movement:Type (Bristol Stool Scale) 4, Frequency daily, and Strain No Fully empty rectum: Yes:   Leakage: No Pads: No Fiber supplement: No  URINATION Pain with urination: No Fully empty bladder: Yes: but smaller voids usually Stream: Strong and Weak Urgency: Yes: all the time Frequency: sometimes feels  like every 10 mins however sometimes a couple hours; usually 1-2x at night (has had leakage at night with dreams of emptying) Leakage: Urge to void, Walking to the bathroom, Coughing, Sneezing, and Lifting Pads: Yes: usually once per day but sometimes 2  INTERCOURSE Pain with intercourse:  none Ability to have vaginal penetration:  Yes:   Climax: not painful Marinoff Scale: 0/3  PREGNANCY Vaginal deliveries 0 Tearing No C-section deliveries 0 Currently pregnant No  PROLAPSE None    OBJECTIVE:   DIAGNOSTIC FINDINGS:     COGNITION:Within functional limits for tasks assessed  Overall cognitive status: Within functional limits for tasks assessed     SENSATION:  Light touch: Appears intact  Proprioception: Appears intact  MUSCLE LENGTH: Hamstrings and adductors limited by 25%                 POSTURE: rounded shoulders and anterior pelvic tilt    LUMBARAROM/PROM  A/PROM A/PROM  eval  Flexion Limited by 50%  Extension Limited by 25%  Right lateral flexion Limited by 25%  Left lateral flexion Limited by 25%  Right rotation Limited by 25%  Left rotation Limited by 25%   (Blank rows = not tested)  LOWER EXTREMITY ROM:   WFL though with pain at bil knees reporting arthritis in both knees  PALPATION:   General  no TTP, mild fascial restrictions noted in lower abdominal quadrants                 External Perineal Exam no TTP                             Internal Pelvic Floor no TTP  Patient confirms identification and approves PT to assess internal pelvic floor and treatment Yes  PELVIC MMT:   MMT eval  Vaginal 4/5; 5s; 6 reps  Internal Anal Sphincter   External Anal Sphincter   Puborectalis   Diastasis Recti   (Blank rows = not tested)        TONE: Increased throughout without pain  PROLAPSE: Not seen in hooklying however limited by pt's body habitus  TODAY'S TREATMENT  EVAL Examination completed, findings reviewed, pt educated on POC, HEP,  bladder irritants, and urge drill. Pt motivated to participate in PT and agreeable to attempt recommendations.     PATIENT EDUCATION:  Education details: [id number] Person educated: Patient Education method: Explanation, Demonstration, Tactile cues, Verbal cues, and Handouts Education comprehension: verbalized understanding and returned demonstration   HOME EXERCISE PROGRAM: [id number]  ASSESSMENT:  CLINICAL IMPRESSION: Patient is a 46 y.o. female  who was seen today for physical therapy evaluation and treatment for urinary incontinence with stressors of sneezing and coughing and with urgency. Pt also endorses strong frequency intermittently though this varies and with attempting to wait too long with have leakage with transferring into standing. Pt wears at least one large pad per day and night and sometimes has had leakage at night waking from sleep to void. Pt reports she feels like she can't move with strong urge or she will have an accident. Pt denied pain throughout. Pt found to have decreased core and bil hip strength, decreased flexibility in spine and bil hips, fascial restrictions in abdomen. Pt consented to internal vaginal assessment this date and found to have decreased strength, coordination, and endurance however also have increased tightness throughout all quadrants of pelvic floor. Pt denied all pain with internal palpation but true strength/endurance/coordination may be limited in testing due to inability to fully relax pelvic floor. Pt would benefit from additional PT to further address deficits.     OBJECTIVE IMPAIRMENTS decreased coordination, decreased endurance, decreased strength, increased fascial restrictions, impaired flexibility, improper body mechanics, and postural dysfunction.   ACTIVITY LIMITATIONS carrying, lifting, squatting, transfers, and continence  PARTICIPATION LIMITATIONS: community activity, occupation, and yard work  PERSONAL FACTORS Time since onset of  injury/illness/exacerbation are also affecting patient's functional outcome.   REHAB POTENTIAL: Good  CLINICAL DECISION MAKING: Stable/uncomplicated  EVALUATION COMPLEXITY: Low   GOALS: Goals reviewed with patient? Yes  SHORT TERM GOALS: Target date: 11/04/2021  Pt to be I with HEP.  Baseline: Goal status: INITIAL  2.  Pt to report improved time between bladder voids to at least 1 hour for improved QOL with decreased urinary frequency and leakage during this time.   Baseline:  Goal status: INITIAL  3.  Pt to demonstrate improved pelvic floor contract/relax/bulge/relax coordination with minimal cues 75% of the time for improved pelvic floor mobility.  Baseline:  Goal status: INITIAL  4.  Pt will report her voids are complete due to improved evacuation techniques and pelvic relaxation.  Baseline:  Goal status: INITIAL   LONG TERM GOALS: Target date:  01/07/22    Pt to be I with advanced HEP.  Baseline:  Goal status: INITIAL  2.  Pt will have 50% less urgency due to bladder retraining and strengthening  Baseline:  Goal status: INITIAL  3.  Pt to report improved time between bladder voids to at least 2 hours for improved QOL with decreased urinary frequency and without leakage during this time.   Baseline:  Goal status: INITIAL  4.  Pt will be able to functional actions such as walking 10 minutes without leakage  Baseline:  Goal status: INITIAL  5.  Pt to demonstrate at least 4/5 pelvic floor strength with ability to hold contraction for at least 8 seconds and then fully relax for improved pelvic stability and decreased strain at pelvic floor/ decrease leakage.  Baseline:  Goal status: INITIAL  6.  Pt to demonstrate improved coordination of pelvic floor and breathing mechanics with 10#  squat without leakage.  Baseline:  Goal status: INITIAL  PLAN: PT FREQUENCY: 1x/week  PT DURATION:  8 sessions  PLANNED INTERVENTIONS: Therapeutic exercises, Therapeutic activity,  Neuromuscular re-education, Patient/Family education, Self Care, Joint mobilization, Aquatic Therapy, Dry Needling, Spinal mobilization, Cryotherapy, Moist heat, scar mobilization, Taping, Biofeedback, and Manual therapy  PLAN FOR NEXT SESSION: internal if needed and pt consents, pelvic relaxation as needed, core strengthening, coordination of breathing and pelvic floor.    Stacy Gardner, PT, DPT 08/08/234:11 PM

## 2021-10-13 DIAGNOSIS — N3946 Mixed incontinence: Secondary | ICD-10-CM | POA: Insufficient documentation

## 2021-10-15 ENCOUNTER — Ambulatory Visit: Payer: Commercial Managed Care - HMO | Admitting: Physical Therapy

## 2021-10-29 ENCOUNTER — Ambulatory Visit: Payer: Commercial Managed Care - HMO | Admitting: Physical Therapy

## 2021-10-30 ENCOUNTER — Ambulatory Visit: Payer: Commercial Managed Care - HMO | Admitting: Physical Therapy

## 2021-10-30 DIAGNOSIS — R279 Unspecified lack of coordination: Secondary | ICD-10-CM

## 2021-10-30 DIAGNOSIS — M6281 Muscle weakness (generalized): Secondary | ICD-10-CM

## 2021-10-30 DIAGNOSIS — R293 Abnormal posture: Secondary | ICD-10-CM

## 2021-10-30 NOTE — Therapy (Signed)
OUTPATIENT PHYSICAL THERAPY FEMALE PELVIC TREATMENT   Patient Name: Darlene [name] MRN: [id number] DOB:12-May-1975, 46 y.o., female Today's Date: 10/30/2021   PT End of Session - 10/30/21 0931     Visit Number 2    Date for PT Re-Evaluation 01/07/22    Authorization Type [employer] [place] [employer]    PT Start Time 0930    PT Stop Time 1016    PT Time Calculation (min) 46 min    Activity Tolerance Patient tolerated treatment well    Behavior During Therapy WFL for tasks assessed/performed             Past Medical History:  Diagnosis Date   Acute bronchitis    Chest pain    Colon polyps    Depression    Diabetes mellitus without complication ([place])    Environmental and seasonal allergies    Genital herpes    Hyperlipidemia    Hypertension    Insomnia    Lump or mass in breast    Morbid obesity ([place])    OSA (obstructive sleep apnea)    Right knee pain    Vaginitis    Vitiligo    Past Surgical History:  Procedure Laterality Date   COLONOSCOPY     COLONOSCOPY WITH PROPOFOL N/A 09/20/2020   Procedure: COLONOSCOPY WITH PROPOFOL;  Surgeon: Carol Ada, MD;  Location: [facility];  Service: Endoscopy;  Laterality: N/A;   WISDOM TOOTH EXTRACTION     Patient Active Problem List   Diagnosis Date Noted   Diabetes ([place]) 08/22/2021   Recurrent isolated sleep paralysis 01/23/2020   Hypoventilation associated with obesity syndrome ([place]) 12/25/2019   Excessive daytime sleepiness 12/25/2019   Irregular periods 11/20/2019   Polycystic ovary syndrome 11/20/2019   HYPERGLYCEMIA 11/29/2009   EDEMA 11/27/2009   GERD 04/01/2009   BLOOD IN STOOL 04/01/2009   OBSTRUCTIVE SLEEP APNEA 03/27/2009   UNSPECIFIED HEARING LOSS 01/29/2009   GENITAL HERPES, HX OF 01/29/2009   KNEE PAIN, RIGHT 04/16/2008   OBESITY, MORBID 11/17/2006   ABDOMINAL CRAMPS 11/17/2006   Essential hypertension 08/30/2006   Hyperlipidemia 07/20/2006   ANEMIA-NOS 07/20/2006   Depression 07/20/2006   VITILIGO 07/20/2006    INSOMNIA 07/20/2006    PCP: Binnie Rail, MD  REFERRING PROVIDER: Janyth Contes, MD  REFERRING DIAG: N39.46 (ICD-10-CM) - Mixed incontinence  THERAPY DIAG:  Muscle weakness (generalized)  Abnormal posture  Unspecified lack of coordination  Rationale for Evaluation and Treatment Rehabilitation  ONSET DATE: years, progressively getting worse  SUBJECTIVE:  SUBJECTIVE STATEMENT: Pt reports urge drill has been so helpful, "this has been so much better. I have noticed a difference since first visit". Pt now able to walk to the bathroom easier less leakage. Pt also states she is having a fuller bladder void now without any feeling need to back to back urges to urinate.   Fluid intake: Yes: does limit water intake due to fear of leakage;   10/30/21 - now drinking more regular fluids with improved confidence per pt   PAIN:  Are you having pain? No   PRECAUTIONS: None  WEIGHT BEARING RESTRICTIONS No  FALLS:  Has patient fallen in last 6 months? No  LIVING ENVIRONMENT: Lives with: lives with their family Lives in: House/apartment   OCCUPATION: medical transportation   PLOF: Independent  PATIENT GOALS to have less leakage  PERTINENT HISTORY:  htn, diabetes, hld, mod-sev OSA, depression Sexual abuse: No  BOWEL MOVEMENT Pain with bowel movement: No Type of bowel movement:Type (Bristol Stool Scale) 4, Frequency daily, and Strain No Fully empty rectum: Yes:   Leakage: No Pads: No Fiber supplement: No  URINATION Pain with urination: No Fully empty bladder: Yes: but smaller voids usually Stream: Strong and Weak Urgency: Yes: all the time Frequency: sometimes feels like every 10 mins however sometimes a couple hours; usually 1-2x at night (has had leakage at night with  dreams of emptying) Leakage: Urge to void, Walking to the bathroom, Coughing, Sneezing, and Lifting Pads: Yes: usually once per day but sometimes 2  INTERCOURSE Pain with intercourse:  none Ability to have vaginal penetration:  Yes:   Climax: not painful Marinoff Scale: 0/3  PREGNANCY Vaginal deliveries 0 Tearing No C-section deliveries 0 Currently pregnant No  PROLAPSE None    OBJECTIVE:   DIAGNOSTIC FINDINGS:     COGNITION:Within functional limits for tasks assessed  Overall cognitive status: Within functional limits for tasks assessed     SENSATION:  Light touch: Appears intact  Proprioception: Appears intact  MUSCLE LENGTH: Hamstrings and adductors limited by 25%                 POSTURE: rounded shoulders and anterior pelvic tilt    LUMBARAROM/PROM  A/PROM A/PROM  eval  Flexion Limited by 50%  Extension Limited by 25%  Right lateral flexion Limited by 25%  Left lateral flexion Limited by 25%  Right rotation Limited by 25%  Left rotation Limited by 25%   (Blank rows = not tested)  LOWER EXTREMITY ROM:   WFL though with pain at bil knees reporting arthritis in both knees  PALPATION:   General  no TTP, mild fascial restrictions noted in lower abdominal quadrants                 External Perineal Exam no TTP                             Internal Pelvic Floor no TTP  Patient confirms identification and approves PT to assess internal pelvic floor and treatment Yes  PELVIC MMT:   MMT eval  Vaginal 4/5; 5s; 6 reps  Internal Anal Sphincter   External Anal Sphincter   Puborectalis   Diastasis Recti   (Blank rows = not tested)        TONE: Increased throughout without pain  PROLAPSE: Not seen in hooklying however limited by pt's body habitus   TODAY'S TREATMENT   10/30/21: Butterfly stretch 2x30s Hip flexor stretch 2x30s  Hamstring stretch 2x30s Mini Bridges x10  Blue band hip abduction in hooklying x10 Blue band hip flexion  alternating in hooklying x10    PATIENT EDUCATION:  Education details: [id number] Person educated: Patient Education method: Consulting civil engineer, Demonstration, Tactile cues, Verbal cues, and Handouts Education comprehension: verbalized understanding and returned demonstration   HOME EXERCISE PROGRAM: [id number]  ASSESSMENT:  CLINICAL IMPRESSION: Patient seeing great progress with urgency so far and pleased with this, is having less leakage with urge to get to bathroom but still leakage with stressors and urge but a little less in amount. Pt session focused on hip mobility and strengthening with coordination of breathing mechanics. Pt limited with chronic hip and back pain with tightness throughout these areas but able to complete gentle mobility and initiation of strengthening to incorporate the pelvic floor as well. Pt tolerated well with cues for technique and coordination. Pt would benefit from additional PT to further address deficits.     OBJECTIVE IMPAIRMENTS decreased coordination, decreased endurance, decreased strength, increased fascial restrictions, impaired flexibility, improper body mechanics, and postural dysfunction.   ACTIVITY LIMITATIONS carrying, lifting, squatting, transfers, and continence  PARTICIPATION LIMITATIONS: community activity, occupation, and yard work  PERSONAL FACTORS Time since onset of injury/illness/exacerbation are also affecting patient's functional outcome.   REHAB POTENTIAL: Good  CLINICAL DECISION MAKING: Stable/uncomplicated  EVALUATION COMPLEXITY: Low   GOALS: Goals reviewed with patient? Yes  SHORT TERM GOALS: Target date: 11/04/2021  Pt to be I with HEP.  Baseline: Goal status: INITIAL  2.  Pt to report improved time between bladder voids to at least 1 hour for improved QOL with decreased urinary frequency and leakage during this time.   Baseline:  Goal status: INITIAL  3.  Pt to demonstrate improved pelvic floor  contract/relax/bulge/relax coordination with minimal cues 75% of the time for improved pelvic floor mobility.  Baseline:  Goal status: INITIAL  4.  Pt will report her voids are complete due to improved evacuation techniques and pelvic relaxation.  Baseline:  Goal status: INITIAL   LONG TERM GOALS: Target date:  01/07/22    Pt to be I with advanced HEP.  Baseline:  Goal status: INITIAL  2.  Pt will have 50% less urgency due to bladder retraining and strengthening  Baseline:  Goal status: INITIAL  3.  Pt to report improved time between bladder voids to at least 2 hours for improved QOL with decreased urinary frequency and without leakage during this time.   Baseline:  Goal status: INITIAL  4.  Pt will be able to functional actions such as walking 10 minutes without leakage  Baseline:  Goal status: INITIAL  5.  Pt to demonstrate at least 4/5 pelvic floor strength with ability to hold contraction for at least 8 seconds and then fully relax for improved pelvic stability and decreased strain at pelvic floor/ decrease leakage.  Baseline:  Goal status: INITIAL  6.  Pt to demonstrate improved coordination of pelvic floor and breathing mechanics with 10# squat without leakage.  Baseline:  Goal status: INITIAL  PLAN: PT FREQUENCY: 1x/week  PT DURATION:  8 sessions  PLANNED INTERVENTIONS: Therapeutic exercises, Therapeutic activity, Neuromuscular re-education, Patient/Family education, Self Care, Joint mobilization, Aquatic Therapy, Dry Needling, Spinal mobilization, Cryotherapy, Moist heat, scar mobilization, Taping, Biofeedback, and Manual therapy  PLAN FOR NEXT SESSION: internal if needed and pt consents, pelvic relaxation as needed, core strengthening, coordination of breathing and pelvic floor.    Stacy Gardner, PT, DPT 10/30/2308:17 AM

## 2021-11-04 ENCOUNTER — Ambulatory Visit: Payer: Commercial Managed Care - HMO | Attending: Obstetrics and Gynecology | Admitting: Physical Therapy

## 2021-11-04 DIAGNOSIS — R279 Unspecified lack of coordination: Secondary | ICD-10-CM | POA: Insufficient documentation

## 2021-11-04 DIAGNOSIS — R293 Abnormal posture: Secondary | ICD-10-CM | POA: Diagnosis present

## 2021-11-04 DIAGNOSIS — M6281 Muscle weakness (generalized): Secondary | ICD-10-CM | POA: Diagnosis present

## 2021-11-04 NOTE — Patient Instructions (Signed)
[email or web address]   

## 2021-11-04 NOTE — Therapy (Signed)
OUTPATIENT PHYSICAL THERAPY FEMALE PELVIC TREATMENT   Patient Name: Darlene [name] MRN: [id number] DOB:07-03-1975, 46 y.o., female Today's Date: 11/04/2021   PT End of Session - 11/04/21 1616     Visit Number 3    Date for PT Re-Evaluation 01/07/22    Authorization Type [employer]    PT Start Time 1610    PT Stop Time 1700    PT Time Calculation (min) 50 min    Activity Tolerance Patient tolerated treatment well    Behavior During Therapy WFL for tasks assessed/performed              Past Medical History:  Diagnosis Date   Acute bronchitis    Chest pain    Colon polyps    Depression    Diabetes mellitus without complication ([place])    Environmental and seasonal allergies    Genital herpes    Hyperlipidemia    Hypertension    Insomnia    Lump or mass in breast    Morbid obesity ([place])    OSA (obstructive sleep apnea)    Right knee pain    Vaginitis    Vitiligo    Past Surgical History:  Procedure Laterality Date   COLONOSCOPY     COLONOSCOPY WITH PROPOFOL N/A 09/20/2020   Procedure: COLONOSCOPY WITH PROPOFOL;  Surgeon: Carol Ada, MD;  Location: [facility];  Service: Endoscopy;  Laterality: N/A;   WISDOM TOOTH EXTRACTION     Patient Active Problem List   Diagnosis Date Noted   Diabetes ([place]) 08/22/2021   Recurrent isolated sleep paralysis 01/23/2020   Hypoventilation associated with obesity syndrome ([place]) 12/25/2019   Excessive daytime sleepiness 12/25/2019   Irregular periods 11/20/2019   Polycystic ovary syndrome 11/20/2019   HYPERGLYCEMIA 11/29/2009   EDEMA 11/27/2009   GERD 04/01/2009   BLOOD IN STOOL 04/01/2009   OBSTRUCTIVE SLEEP APNEA 03/27/2009   UNSPECIFIED HEARING LOSS 01/29/2009   GENITAL HERPES, HX OF 01/29/2009   KNEE PAIN, RIGHT 04/16/2008   OBESITY, MORBID 11/17/2006   ABDOMINAL CRAMPS 11/17/2006   Essential hypertension 08/30/2006   Hyperlipidemia 07/20/2006   ANEMIA-NOS 07/20/2006   Depression 07/20/2006   VITILIGO 07/20/2006    INSOMNIA 07/20/2006    PCP: Binnie Rail, MD  REFERRING PROVIDER: Janyth Contes, MD  REFERRING DIAG: N39.46 (ICD-10-CM) - Mixed incontinence  THERAPY DIAG:  Muscle weakness (generalized)  Unspecified lack of coordination  Abnormal posture  Rationale for Evaluation and Treatment Rehabilitation  ONSET DATE: years, progressively getting worse  SUBJECTIVE:  SUBJECTIVE STATEMENT: Pt reports she has biggest urges after sleeping either at night or naps and has intense urge to make it to bathroom. Will sometimes have leakage en route to bathroom but not always. Pt is increasing water intake and doesn't know if this is contributing or not but is urge drill with waking and helps slightly as she is not leaking but urge is high.   Fluid intake: Yes: does limit water intake due to fear of leakage;   10/30/21 - now drinking more regular fluids with improved confidence per pt   PAIN:  Are you having pain? No   PRECAUTIONS: None  WEIGHT BEARING RESTRICTIONS No  FALLS:  Has patient fallen in last 6 months? No  LIVING ENVIRONMENT: Lives with: lives with their family Lives in: House/apartment   OCCUPATION: medical transportation   PLOF: Independent  PATIENT GOALS to have less leakage  PERTINENT HISTORY:  htn, diabetes, hld, mod-sev OSA, depression Sexual abuse: No  BOWEL MOVEMENT Pain with bowel movement: No Type of bowel movement:Type (Bristol Stool Scale) 4, Frequency daily, and Strain No Fully empty rectum: Yes:   Leakage: No Pads: No Fiber supplement: No  URINATION Pain with urination: No Fully empty bladder: Yes: but smaller voids usually Stream: Strong and Weak Urgency: Yes: all the time Frequency: sometimes feels like every 10 mins however sometimes a couple hours;  usually 1-2x at night (has had leakage at night with dreams of emptying) Leakage: Urge to void, Walking to the bathroom, Coughing, Sneezing, and Lifting Pads: Yes: usually once per day but sometimes 2  INTERCOURSE Pain with intercourse:  none Ability to have vaginal penetration:  Yes:   Climax: not painful Marinoff Scale: 0/3  PREGNANCY Vaginal deliveries 0 Tearing No C-section deliveries 0 Currently pregnant No  PROLAPSE None    OBJECTIVE:   DIAGNOSTIC FINDINGS:   COGNITION:Within functional limits for tasks assessed  Overall cognitive status: Within functional limits for tasks assessed     SENSATION:  Light touch: Appears intact  Proprioception: Appears intact  MUSCLE LENGTH: Hamstrings and adductors limited by 25%                 POSTURE: rounded shoulders and anterior pelvic tilt    LUMBARAROM/PROM  A/PROM A/PROM  eval  Flexion Limited by 50%  Extension Limited by 25%  Right lateral flexion Limited by 25%  Left lateral flexion Limited by 25%  Right rotation Limited by 25%  Left rotation Limited by 25%   (Blank rows = not tested)  LOWER EXTREMITY ROM:   WFL though with pain at bil knees reporting arthritis in both knees  PALPATION:   General  no TTP, mild fascial restrictions noted in lower abdominal quadrants                 External Perineal Exam no TTP                             Internal Pelvic Floor no TTP  Patient confirms identification and approves PT to assess internal pelvic floor and treatment Yes  PELVIC MMT:   MMT eval  Vaginal 4/5; 5s; 6 reps  Internal Anal Sphincter   External Anal Sphincter   Puborectalis   Diastasis Recti   (Blank rows = not tested)        TONE: Increased throughout without pain  PROLAPSE: Not seen in hooklying however limited by pt's body habitus   TODAY'S TREATMENT  11/04/2021: Butterfly stretch 2x30s Hip flexor stretch 2x30s Lower trunk rotation x10 Open books 5x10s Cat/cow x10 with cues for  upper thoracic mobility Modified childs pose within pt's mobility 2x30 Diaphragmatic breathing 2x10  Self care: pt educated on voiding mechanics to relax pelvic floor during urination as pt reports she hovers frequently when urinating, also pelvic and global relaxation educated on.   10/30/21: Butterfly stretch 2x30s Hip flexor stretch 2x30s Hamstring stretch 2x30s Mini Bridges x10  Blue band hip abduction in hooklying x10 Blue band hip flexion alternating in hooklying x10   PATIENT EDUCATION:  Education details: [id number] Person educated: Patient Education method: Explanation, Demonstration, Corporate treasurer cues, Verbal cues, and Handouts Education comprehension: verbalized understanding and returned demonstration   HOME EXERCISE PROGRAM: [id number]  ASSESSMENT:  CLINICAL IMPRESSION: Patient continues to see progress with urgency so far and notes biggest urges are after waking. Pt session focused on hip mobility and spinal mobility with coordination of breathing mechanics. Pt limited with chronic hip and back pain with tightness throughout these areas but able to complete gentle mobility. Pt tolerated well with cues for technique and coordination. Pt also educated on voiding mechanics and global relaxation to improve tension release and decrease tightness. Pt would benefit from additional PT to further address deficits.     OBJECTIVE IMPAIRMENTS decreased coordination, decreased endurance, decreased strength, increased fascial restrictions, impaired flexibility, improper body mechanics, and postural dysfunction.   ACTIVITY LIMITATIONS carrying, lifting, squatting, transfers, and continence  PARTICIPATION LIMITATIONS: community activity, occupation, and yard work  PERSONAL FACTORS Time since onset of injury/illness/exacerbation are also affecting patient's functional outcome.   REHAB POTENTIAL: Good  CLINICAL DECISION MAKING: Stable/uncomplicated  EVALUATION COMPLEXITY:  Low   GOALS: Goals reviewed with patient? Yes  SHORT TERM GOALS: Target date: 11/04/2021  Pt to be I with HEP.  Baseline: Goal status: INITIAL  2.  Pt to report improved time between bladder voids to at least 1 hour for improved QOL with decreased urinary frequency and leakage during this time.   Baseline:  Goal status: INITIAL  3.  Pt to demonstrate improved pelvic floor contract/relax/bulge/relax coordination with minimal cues 75% of the time for improved pelvic floor mobility.  Baseline:  Goal status: INITIAL  4.  Pt will report her voids are complete due to improved evacuation techniques and pelvic relaxation.  Baseline:  Goal status: INITIAL   LONG TERM GOALS: Target date:  01/07/22    Pt to be I with advanced HEP.  Baseline:  Goal status: INITIAL  2.  Pt will have 50% less urgency due to bladder retraining and strengthening  Baseline:  Goal status: INITIAL  3.  Pt to report improved time between bladder voids to at least 2 hours for improved QOL with decreased urinary frequency and without leakage during this time.   Baseline:  Goal status: INITIAL  4.  Pt will be able to functional actions such as walking 10 minutes without leakage  Baseline:  Goal status: INITIAL  5.  Pt to demonstrate at least 4/5 pelvic floor strength with ability to hold contraction for at least 8 seconds and then fully relax for improved pelvic stability and decreased strain at pelvic floor/ decrease leakage.  Baseline:  Goal status: INITIAL  6.  Pt to demonstrate improved coordination of pelvic floor and breathing mechanics with 10# squat without leakage.  Baseline:  Goal status: INITIAL  PLAN: PT FREQUENCY: 1x/week  PT DURATION:  8 sessions  PLANNED INTERVENTIONS: Therapeutic exercises, Therapeutic activity, Neuromuscular re-education, Patient/Family education,  Self Care, Joint mobilization, Aquatic Therapy, Dry Needling, Spinal mobilization, Cryotherapy, Moist heat, scar  mobilization, Taping, Biofeedback, and Manual therapy  PLAN FOR NEXT SESSION: internal if needed and pt consents, pelvic relaxation as needed, core strengthening, coordination of breathing and pelvic floor.    Stacy Gardner, PT, DPT 09/05/235:03 PM

## 2021-11-11 ENCOUNTER — Ambulatory Visit: Payer: Commercial Managed Care - HMO | Admitting: Physical Therapy

## 2021-11-24 ENCOUNTER — Telehealth: Payer: Self-pay | Admitting: Physical Therapy

## 2021-11-24 ENCOUNTER — Ambulatory Visit: Payer: Commercial Managed Care - HMO | Admitting: Physical Therapy

## 2021-11-24 NOTE — Telephone Encounter (Signed)
PT called pt about attendance policy and same day cancellations as she has needed to cancel within 24 hours 2x and had one no show for an appointment. Pt did not answer, and unable to leave a voicemail. Will attempt to call patient again or discuss at next appointment.   Stacy Gardner, PT, DPT 09/25/234:40 PM

## 2021-11-27 ENCOUNTER — Ambulatory Visit: Payer: Commercial Managed Care - HMO | Admitting: Physical Therapy

## 2021-12-01 ENCOUNTER — Ambulatory Visit: Payer: Commercial Managed Care - HMO | Attending: Obstetrics and Gynecology | Admitting: Physical Therapy

## 2021-12-01 DIAGNOSIS — M6281 Muscle weakness (generalized): Secondary | ICD-10-CM | POA: Insufficient documentation

## 2021-12-01 DIAGNOSIS — R279 Unspecified lack of coordination: Secondary | ICD-10-CM | POA: Insufficient documentation

## 2021-12-01 DIAGNOSIS — R293 Abnormal posture: Secondary | ICD-10-CM | POA: Insufficient documentation

## 2021-12-01 NOTE — Therapy (Signed)
OUTPATIENT PHYSICAL THERAPY FEMALE PELVIC TREATMENT   Patient Name: Darlene [name] MRN: [id number] DOB:1975-08-09, 46 y.o., female Today's Date: 12/01/2021   PT End of Session - 12/01/21 1530     Visit Number 4    Date for PT Re-Evaluation 01/07/22    Authorization Type [employer] [place] [employer]    PT Start Time 1530    PT Stop Time 1618    PT Time Calculation (min) 48 min    Activity Tolerance Patient tolerated treatment well    Behavior During Therapy WFL for tasks assessed/performed              Past Medical History:  Diagnosis Date   Acute bronchitis    Chest pain    Colon polyps    Depression    Diabetes mellitus without complication ([place])    Environmental and seasonal allergies    Genital herpes    Hyperlipidemia    Hypertension    Insomnia    Lump or mass in breast    Morbid obesity ([place])    OSA (obstructive sleep apnea)    Right knee pain    Vaginitis    Vitiligo    Past Surgical History:  Procedure Laterality Date   COLONOSCOPY     COLONOSCOPY WITH PROPOFOL N/A 09/20/2020   Procedure: COLONOSCOPY WITH PROPOFOL;  Surgeon: Carol Ada, MD;  Location: [facility];  Service: Endoscopy;  Laterality: N/A;   WISDOM TOOTH EXTRACTION     Patient Active Problem List   Diagnosis Date Noted   Diabetes ([place]) 08/22/2021   Recurrent isolated sleep paralysis 01/23/2020   Hypoventilation associated with obesity syndrome ([place]) 12/25/2019   Excessive daytime sleepiness 12/25/2019   Irregular periods 11/20/2019   Polycystic ovary syndrome 11/20/2019   HYPERGLYCEMIA 11/29/2009   EDEMA 11/27/2009   GERD 04/01/2009   BLOOD IN STOOL 04/01/2009   OBSTRUCTIVE SLEEP APNEA 03/27/2009   UNSPECIFIED HEARING LOSS 01/29/2009   GENITAL HERPES, HX OF 01/29/2009   KNEE PAIN, RIGHT 04/16/2008   OBESITY, MORBID 11/17/2006   ABDOMINAL CRAMPS 11/17/2006   Essential hypertension 08/30/2006   Hyperlipidemia 07/20/2006   ANEMIA-NOS 07/20/2006   Depression 07/20/2006   VITILIGO 07/20/2006    INSOMNIA 07/20/2006    PCP: Binnie Rail, MD  REFERRING PROVIDER: Janyth Contes, MD  REFERRING DIAG: N39.46 (ICD-10-CM) - Mixed incontinence  THERAPY DIAG:  Muscle weakness (generalized)  Abnormal posture  Unspecified lack of coordination  Rationale for Evaluation and Treatment Rehabilitation  ONSET DATE: years, progressively getting worse  SUBJECTIVE:  SUBJECTIVE STATEMENT: Pt reports she feels like leakage and urge are the same as last session. Has been trying to pay attention to leakage more and reports she can't stop urine unless at the end of flow.  Pt on period and declined internal today.   Fluid intake: Yes: does limit water intake due to fear of leakage;   10/30/21 - now drinking more regular fluids with improved confidence per pt   PAIN:  Are you having pain? No   PRECAUTIONS: None  WEIGHT BEARING RESTRICTIONS No  FALLS:  Has patient fallen in last 6 months? No  LIVING ENVIRONMENT: Lives with: lives with their family Lives in: House/apartment   OCCUPATION: medical transportation   PLOF: Independent  PATIENT GOALS to have less leakage  PERTINENT HISTORY:  htn, diabetes, hld, mod-sev OSA, depression Sexual abuse: No  BOWEL MOVEMENT Pain with bowel movement: No Type of bowel movement:Type (Bristol Stool Scale) 4, Frequency daily, and Strain No Fully empty rectum: Yes:   Leakage: No Pads: No Fiber supplement: No  URINATION Pain with urination: No Fully empty bladder: Yes: but smaller voids usually Stream: Strong and Weak Urgency: Yes: all the time Frequency: sometimes feels like every 10 mins however sometimes a couple hours; usually 1-2x at night (has had leakage at night with dreams of emptying) Leakage: Urge to void, Walking to the bathroom,  Coughing, Sneezing, and Lifting Pads: Yes: usually once per day but sometimes 2  INTERCOURSE Pain with intercourse:  none Ability to have vaginal penetration:  Yes:   Climax: not painful Marinoff Scale: 0/3  PREGNANCY Vaginal deliveries 0 Tearing No C-section deliveries 0 Currently pregnant No  PROLAPSE None    OBJECTIVE:   DIAGNOSTIC FINDINGS:   COGNITION:Within functional limits for tasks assessed  Overall cognitive status: Within functional limits for tasks assessed     SENSATION:  Light touch: Appears intact  Proprioception: Appears intact  MUSCLE LENGTH: Hamstrings and adductors limited by 25%                 POSTURE: rounded shoulders and anterior pelvic tilt    LUMBARAROM/PROM  A/PROM A/PROM  eval  Flexion Limited by 50%  Extension Limited by 25%  Right lateral flexion Limited by 25%  Left lateral flexion Limited by 25%  Right rotation Limited by 25%  Left rotation Limited by 25%   (Blank rows = not tested)  LOWER EXTREMITY ROM:   WFL though with pain at bil knees reporting arthritis in both knees  PALPATION:   General  no TTP, mild fascial restrictions noted in lower abdominal quadrants                 External Perineal Exam no TTP                             Internal Pelvic Floor no TTP  Patient confirms identification and approves PT to assess internal pelvic floor and treatment Yes  PELVIC MMT:   MMT eval  Vaginal 4/5; 5s; 6 reps  Internal Anal Sphincter   External Anal Sphincter   Puborectalis   Diastasis Recti   (Blank rows = not tested)        TONE: Increased throughout without pain  PROLAPSE: Not seen in hooklying however limited by pt's body habitus   TODAY'S TREATMENT   12/01/2021: NMRE with coordinating pelvic floor and breathing with exercises 2x10 diaphragmatic breathing in sitting mod-max verbal cues for this 2x10 alt  marching in sitting  2x10 red band TA activation with bil shoulder horizontal abduction Seated  pelvic floor contractions with towel roll with breathing mechanics 2x10 Seated ball presses with breathing and pelvic floor contraction 2x10 2x10 Sit to stand from mat with breathing mechanics to decrease leakage    11/04/2021: Butterfly stretch 2x30s Hip flexor stretch 2x30s Lower trunk rotation x10 Open books 5x10s Cat/cow x10 with cues for upper thoracic mobility Modified childs pose within pt's mobility 2x30 Diaphragmatic breathing 2x10  Self care: pt educated on voiding mechanics to relax pelvic floor during urination as pt reports she hovers frequently when urinating, also pelvic and global relaxation educated on.     PATIENT EDUCATION:  Education details: [id number] Person educated: Patient Education method: Explanation, Demonstration, Tactile cues, Verbal cues, and Handouts Education comprehension: verbalized understanding and returned demonstration   HOME EXERCISE PROGRAM: [id number]  ASSESSMENT:  CLINICAL IMPRESSION: Patient reports similar symptoms to last session. Pt focused on coordination of pelvic floor breathing mechanics and pelvic floor with activity to decrease leakage. Pt needed max verbal cues and extra time to complete and then able to improve technique. Pt would benefit from additional PT to further address deficits.     OBJECTIVE IMPAIRMENTS decreased coordination, decreased endurance, decreased strength, increased fascial restrictions, impaired flexibility, improper body mechanics, and postural dysfunction.   ACTIVITY LIMITATIONS carrying, lifting, squatting, transfers, and continence  PARTICIPATION LIMITATIONS: community activity, occupation, and yard work  PERSONAL FACTORS Time since onset of injury/illness/exacerbation are also affecting patient's functional outcome.   REHAB POTENTIAL: Good  CLINICAL DECISION MAKING: Stable/uncomplicated  EVALUATION COMPLEXITY: Low   GOALS: Goals reviewed with patient? Yes  SHORT TERM GOALS: Target date:  11/04/2021  Pt to be I with HEP.  Baseline: Goal status: MET  2.  Pt to report improved time between bladder voids to at least 1 hour for improved QOL with decreased urinary frequency and leakage during this time.   Baseline:  Goal status: MET  3.  Pt to demonstrate improved pelvic floor contract/relax/bulge/relax coordination with minimal cues 75% of the time for improved pelvic floor mobility.  Baseline:  Goal status: on going  4.  Pt will report her voids are complete due to improved evacuation techniques and pelvic relaxation.  Baseline:  Goal status: on going   LONG TERM GOALS: Target date:  01/07/22    Pt to be I with advanced HEP.  Baseline:  Goal status: INITIAL  2.  Pt will have 50% less urgency due to bladder retraining and strengthening  Baseline:  Goal status: INITIAL  3.  Pt to report improved time between bladder voids to at least 2 hours for improved QOL with decreased urinary frequency and without leakage during this time.   Baseline:  Goal status: INITIAL  4.  Pt will be able to functional actions such as walking 10 minutes without leakage  Baseline:  Goal status: INITIAL  5.  Pt to demonstrate at least 4/5 pelvic floor strength with ability to hold contraction for at least 8 seconds and then fully relax for improved pelvic stability and decreased strain at pelvic floor/ decrease leakage.  Baseline:  Goal status: INITIAL  6.  Pt to demonstrate improved coordination of pelvic floor and breathing mechanics with 10# squat without leakage.  Baseline:  Goal status: INITIAL  PLAN: PT FREQUENCY: 1x/week  PT DURATION:  8 sessions  PLANNED INTERVENTIONS: Therapeutic exercises, Therapeutic activity, Neuromuscular re-education, Patient/Family education, Self Care, Joint mobilization, Aquatic Therapy, Dry Needling, Spinal mobilization, Cryotherapy, Moist  heat, scar mobilization, Taping, Biofeedback, and Manual therapy  PLAN FOR NEXT SESSION: internal if needed  and pt consents, pelvic relaxation as needed, core strengthening, coordination of breathing and pelvic floor.    Stacy Gardner, PT, DPT 10/02/234:22 PM

## 2021-12-08 ENCOUNTER — Ambulatory Visit: Payer: Commercial Managed Care - HMO | Admitting: Physical Therapy

## 2021-12-08 DIAGNOSIS — M6281 Muscle weakness (generalized): Secondary | ICD-10-CM

## 2021-12-08 DIAGNOSIS — R279 Unspecified lack of coordination: Secondary | ICD-10-CM

## 2021-12-08 NOTE — Therapy (Signed)
OUTPATIENT PHYSICAL THERAPY FEMALE PELVIC TREATMENT   Patient Name: Darlene [name] MRN: [id number] DOB:24-Oct-1975, 46 y.o., female Today's Date: 12/08/2021   PT End of Session - 12/08/21 1543     Visit Number 5    Date for PT Re-Evaluation 01/07/22    Authorization Type [employer] [place] [employer]    PT Start Time 4388   pt arrival time   PT Stop Time 1615    PT Time Calculation (min) 34 min    Activity Tolerance Patient tolerated treatment well    Behavior During Therapy WFL for tasks assessed/performed              Past Medical History:  Diagnosis Date   Acute bronchitis    Chest pain    Colon polyps    Depression    Diabetes mellitus without complication ([place])    Environmental and seasonal allergies    Genital herpes    Hyperlipidemia    Hypertension    Insomnia    Lump or mass in breast    Morbid obesity ([place])    OSA (obstructive sleep apnea)    Right knee pain    Vaginitis    Vitiligo    Past Surgical History:  Procedure Laterality Date   COLONOSCOPY     COLONOSCOPY WITH PROPOFOL N/A 09/20/2020   Procedure: COLONOSCOPY WITH PROPOFOL;  Surgeon: Carol Ada, MD;  Location: [facility];  Service: Endoscopy;  Laterality: N/A;   WISDOM TOOTH EXTRACTION     Patient Active Problem List   Diagnosis Date Noted   Diabetes ([place]) 08/22/2021   Recurrent isolated sleep paralysis 01/23/2020   Hypoventilation associated with obesity syndrome ([place]) 12/25/2019   Excessive daytime sleepiness 12/25/2019   Irregular periods 11/20/2019   Polycystic ovary syndrome 11/20/2019   HYPERGLYCEMIA 11/29/2009   EDEMA 11/27/2009   GERD 04/01/2009   BLOOD IN STOOL 04/01/2009   OBSTRUCTIVE SLEEP APNEA 03/27/2009   UNSPECIFIED HEARING LOSS 01/29/2009   GENITAL HERPES, HX OF 01/29/2009   KNEE PAIN, RIGHT 04/16/2008   OBESITY, MORBID 11/17/2006   ABDOMINAL CRAMPS 11/17/2006   Essential hypertension 08/30/2006   Hyperlipidemia 07/20/2006   ANEMIA-NOS 07/20/2006   Depression 07/20/2006    VITILIGO 07/20/2006   INSOMNIA 07/20/2006    PCP: Binnie Rail, MD  REFERRING PROVIDER: Janyth Contes, MD  REFERRING DIAG: N39.46 (ICD-10-CM) - Mixed incontinence  THERAPY DIAG:  Muscle weakness (generalized)  Unspecified lack of coordination  Rationale for Evaluation and Treatment Rehabilitation  ONSET DATE: years, progressively getting worse  SUBJECTIVE:  SUBJECTIVE STATEMENT: Pt reports she has now noticed improvement with ability to stop stream of urine and only had one urinary leakage instance since last session and very pleased with this. Pt states she had used urge drill to increase time to make to bathroom and sneezes outside of home then had leakage.   Fluid intake: Yes: does limit water intake due to fear of leakage;   10/30/21 - now drinking more regular fluids with improved confidence per pt   PAIN:  Are you having pain? No   PRECAUTIONS: None  WEIGHT BEARING RESTRICTIONS No  FALLS:  Has patient fallen in last 6 months? No  LIVING ENVIRONMENT: Lives with: lives with their family Lives in: House/apartment   OCCUPATION: medical transportation   PLOF: Independent  PATIENT GOALS to have less leakage  PERTINENT HISTORY:  htn, diabetes, hld, mod-sev OSA, depression Sexual abuse: No  BOWEL MOVEMENT Pain with bowel movement: No Type of bowel movement:Type (Bristol Stool Scale) 4, Frequency daily, and Strain No Fully empty rectum: Yes:   Leakage: No Pads: No Fiber supplement: No  URINATION Pain with urination: No Fully empty bladder: Yes: but smaller voids usually Stream: Strong and Weak Urgency: Yes: all the time Frequency: sometimes feels like every 10 mins however sometimes a couple hours; usually 1-2x at night (has had leakage at night with dreams of  emptying) Leakage: Urge to void, Walking to the bathroom, Coughing, Sneezing, and Lifting Pads: Yes: usually once per day but sometimes 2  INTERCOURSE Pain with intercourse:  none Ability to have vaginal penetration:  Yes:   Climax: not painful Marinoff Scale: 0/3  PREGNANCY Vaginal deliveries 0 Tearing No C-section deliveries 0 Currently pregnant No  PROLAPSE None    OBJECTIVE:   DIAGNOSTIC FINDINGS:   COGNITION:Within functional limits for tasks assessed  Overall cognitive status: Within functional limits for tasks assessed     SENSATION:  Light touch: Appears intact  Proprioception: Appears intact  MUSCLE LENGTH: Hamstrings and adductors limited by 25%                 POSTURE: rounded shoulders and anterior pelvic tilt    LUMBARAROM/PROM  A/PROM A/PROM  eval  Flexion Limited by 50%  Extension Limited by 25%  Right lateral flexion Limited by 25%  Left lateral flexion Limited by 25%  Right rotation Limited by 25%  Left rotation Limited by 25%   (Blank rows = not tested)  LOWER EXTREMITY ROM:   WFL though with pain at bil knees reporting arthritis in both knees  PALPATION:   General  no TTP, mild fascial restrictions noted in lower abdominal quadrants                 External Perineal Exam no TTP                             Internal Pelvic Floor no TTP  Patient confirms identification and approves PT to assess internal pelvic floor and treatment Yes  PELVIC MMT:   MMT eval 12/08/21   Vaginal 4/5; 5s; 6 reps 4/5; 9s; 10 reps without cues 5/5 strength with 3 reps and 8s isometrics  Internal Anal Sphincter    External Anal Sphincter    Puborectalis    Diastasis Recti    (Blank rows = not tested)        TONE: Increased throughout without pain [facility] 12/08/21   PROLAPSE: Not seen in hooklying however limited by  pt's body habitus   TODAY'S TREATMENT   12/08/2021: Pt consented to internal vaginal treatment today 2x10 pelvic floor  contractions, 1Y78 quick flicks, G95 6-21H isometrics Findings above.  2x10 Sit to stand with coordination of pelvic floor and breathing mechanics Seated TA activation with breathing mechanics and pelvic floor coordination x10   12/01/2021: NMRE with coordinating pelvic floor and breathing with exercises 2x10 diaphragmatic breathing in sitting mod-max verbal cues for this 2x10 alt marching in sitting  2x10 red band TA activation with bil shoulder horizontal abduction Seated pelvic floor contractions with towel roll with breathing mechanics 2x10 Seated ball presses with breathing and pelvic floor contraction 2x10 2x10 Sit to stand from mat with breathing mechanics to decrease leakage    PATIENT EDUCATION:  Education details: [id number] Person educated: Patient Education method: Explanation, Demonstration, Tactile cues, Verbal cues, and Handouts Education comprehension: verbalized understanding and returned demonstration   HOME EXERCISE PROGRAM: [email or web address]  ASSESSMENT:  CLINICAL IMPRESSION: Patient reports great week of improvement with leakage symptoms and very pleased with this. Pt focused on coordination of pelvic floor breathing mechanics and pelvic floor with activity to decrease leakage and internal pelvic floor reassessment with details above. Pt does demonstrate improvement with strength, endurance, coordination of pelvic floor based on internal assessment but also seeing good improvements and continues to met goals based on function as well. Pt needed minimal verbal cues this session and good carry over noted. Pt would benefit from additional PT to further address deficits.     OBJECTIVE IMPAIRMENTS decreased coordination, decreased endurance, decreased strength, increased fascial restrictions, impaired flexibility, improper body mechanics, and postural dysfunction.   ACTIVITY LIMITATIONS carrying, lifting, squatting, transfers, and continence  PARTICIPATION LIMITATIONS:  community activity, occupation, and yard work  PERSONAL FACTORS Time since onset of injury/illness/exacerbation are also affecting patient's functional outcome.   REHAB POTENTIAL: Good  CLINICAL DECISION MAKING: Stable/uncomplicated  EVALUATION COMPLEXITY: Low   GOALS: Goals reviewed with patient? Yes  SHORT TERM GOALS: Target date: 11/04/2021  Pt to be I with HEP.  Baseline: Goal status: MET  2.  Pt to report improved time between bladder voids to at least 1 hour for improved QOL with decreased urinary frequency and leakage during this time.   Baseline:  Goal status: MET  3.  Pt to demonstrate improved pelvic floor contract/relax/bulge/relax coordination with minimal cues 75% of the time for improved pelvic floor mobility.  Baseline:  Goal status: on going  4.  Pt will report her voids are complete due to improved evacuation techniques and pelvic relaxation.  Baseline:  Goal status: MET   LONG TERM GOALS: Target date:  01/07/22    Pt to be I with advanced HEP.  Baseline:  Goal status: MET  2.  Pt will have 50% less urgency due to bladder retraining and strengthening  Baseline:  Goal status: MET  3.  Pt to report improved time between bladder voids to at least 2 hours for improved QOL with decreased urinary frequency and without leakage during this time.   Baseline:  Goal status: on going - has gone over 2 hours but not consistently 12/08/21   4.  Pt will be able to functional actions such as walking 10 minutes without leakage  Baseline:  Goal status: MET  5.  Pt to demonstrate at least 4/5 pelvic floor strength with ability to hold contraction for at least 8 seconds and then fully relax for improved pelvic stability and decreased strain at pelvic floor/ decrease leakage.  Baseline:  Goal status: INITIAL  6.  Pt to demonstrate improved coordination of pelvic floor and breathing mechanics with 10# squat without leakage.  Baseline:  Goal status: on  going  PLAN: PT FREQUENCY: 1x/week  PT DURATION:  8 sessions  PLANNED INTERVENTIONS: Therapeutic exercises, Therapeutic activity, Neuromuscular re-education, Patient/Family education, Self Care, Joint mobilization, Aquatic Therapy, Dry Needling, Spinal mobilization, Cryotherapy, Moist heat, scar mobilization, Taping, Biofeedback, and Manual therapy  PLAN FOR NEXT SESSION: internal if needed and pt consents, pelvic relaxation as needed, core strengthening, coordination of breathing and pelvic floor.    Stacy Gardner, PT, DPT 10/09/235:01 PM

## 2021-12-15 ENCOUNTER — Ambulatory Visit: Payer: Commercial Managed Care - HMO | Admitting: Physical Therapy

## 2021-12-15 DIAGNOSIS — M6281 Muscle weakness (generalized): Secondary | ICD-10-CM

## 2021-12-15 DIAGNOSIS — R293 Abnormal posture: Secondary | ICD-10-CM

## 2021-12-15 DIAGNOSIS — R279 Unspecified lack of coordination: Secondary | ICD-10-CM

## 2021-12-15 NOTE — Therapy (Signed)
OUTPATIENT PHYSICAL THERAPY FEMALE PELVIC TREATMENT   Patient Name: Darlene [name] MRN: [id number] DOB:[birth year], 46 y.o., female Today's Date: 12/15/2021   PT End of Session - 12/15/21 1534     Visit Number 6    Date for PT Re-Evaluation 01/07/22    Authorization Type [employer] [place] [employer]    PT Start Time 1534   pt arrival time   PT Stop Time 1613    PT Time Calculation (min) 39 min    Activity Tolerance Patient tolerated treatment well    Behavior During Therapy WFL for tasks assessed/performed              Past Medical History:  Diagnosis Date   Acute bronchitis    Chest pain    Colon polyps    Depression    Diabetes mellitus without complication ([place])    Environmental and seasonal allergies    Genital herpes    Hyperlipidemia    Hypertension    Insomnia    Lump or mass in breast    Morbid obesity ([place])    OSA (obstructive sleep apnea)    Right knee pain    Vaginitis    Vitiligo    Past Surgical History:  Procedure Laterality Date   COLONOSCOPY     COLONOSCOPY WITH PROPOFOL N/A 09/20/2020   Procedure: COLONOSCOPY WITH PROPOFOL;  Surgeon: Carol Ada, MD;  Location: [facility];  Service: Endoscopy;  Laterality: N/A;   WISDOM TOOTH EXTRACTION     Patient Active Problem List   Diagnosis Date Noted   Diabetes ([place]) 08/22/2021   Recurrent isolated sleep paralysis 01/23/2020   Hypoventilation associated with obesity syndrome ([place]) 12/25/2019   Excessive daytime sleepiness 12/25/2019   Irregular periods 11/20/2019   Polycystic ovary syndrome 11/20/2019   HYPERGLYCEMIA 11/29/2009   EDEMA 11/27/2009   GERD 04/01/2009   BLOOD IN STOOL 04/01/2009   OBSTRUCTIVE SLEEP APNEA 03/27/2009   UNSPECIFIED HEARING LOSS 01/29/2009   GENITAL HERPES, HX OF 01/29/2009   KNEE PAIN, RIGHT 04/16/2008   OBESITY, MORBID 11/17/2006   ABDOMINAL CRAMPS 11/17/2006   Essential hypertension 08/30/2006   Hyperlipidemia 07/20/2006   ANEMIA-NOS 07/20/2006   Depression 07/20/2006    VITILIGO 07/20/2006   INSOMNIA 07/20/2006    PCP: Binnie Rail, MD  REFERRING PROVIDER: Janyth Contes, MD  REFERRING DIAG: N39.46 (ICD-10-CM) - Mixed incontinence  THERAPY DIAG:  Muscle weakness (generalized)  Unspecified lack of coordination  Abnormal posture  Rationale for Evaluation and Treatment Rehabilitation  ONSET DATE: years, progressively getting worse  SUBJECTIVE:  SUBJECTIVE STATEMENT: Pt reports she had one instance of urinary leakage since last visit, with waking up and having urge and had leakage coming to standing. Pt reports this her biggest issue now. "I'm stopping the flow overall, urgency is still intense". Leakage occurs once starting to walk after coming to standing. Urge drill helps delay slightly but not fully and leakage now intermittent.   Fluid intake: Yes: does limit water intake due to fear of leakage;   10/30/21 - now drinking more regular fluids with improved confidence per pt   PAIN:  Are you having pain? No   PRECAUTIONS: None  WEIGHT BEARING RESTRICTIONS No  FALLS:  Has patient fallen in last 6 months? No  LIVING ENVIRONMENT: Lives with: lives with their family Lives in: House/apartment   OCCUPATION: medical transportation   PLOF: Independent  PATIENT GOALS to have less leakage  PERTINENT HISTORY:  htn, diabetes, hld, mod-sev OSA, depression Sexual abuse: No  BOWEL MOVEMENT Pain with bowel movement: No Type of bowel movement:Type (Bristol Stool Scale) 4, Frequency daily, and Strain No Fully empty rectum: Yes:   Leakage: No Pads: No Fiber supplement: No  URINATION Pain with urination: No Fully empty bladder: Yes: but smaller voids usually Stream: Strong and Weak Urgency: Yes: all the time Frequency: sometimes feels like every  10 mins however sometimes a couple hours; usually 1-2x at night (has had leakage at night with dreams of emptying) Leakage: Urge to void, Walking to the bathroom, Coughing, Sneezing, and Lifting Pads: Yes: usually once per day but sometimes 2  INTERCOURSE Pain with intercourse:  none Ability to have vaginal penetration:  Yes:   Climax: not painful Marinoff Scale: 0/3  PREGNANCY Vaginal deliveries 0 Tearing No C-section deliveries 0 Currently pregnant No  PROLAPSE None    OBJECTIVE:   DIAGNOSTIC FINDINGS:   COGNITION:Within functional limits for tasks assessed  Overall cognitive status: Within functional limits for tasks assessed     SENSATION:  Light touch: Appears intact  Proprioception: Appears intact  MUSCLE LENGTH: Hamstrings and adductors limited by 25%                 POSTURE: rounded shoulders and anterior pelvic tilt    LUMBARAROM/PROM  A/PROM A/PROM  eval  Flexion Limited by 50%  Extension Limited by 25%  Right lateral flexion Limited by 25%  Left lateral flexion Limited by 25%  Right rotation Limited by 25%  Left rotation Limited by 25%   (Blank rows = not tested)  LOWER EXTREMITY ROM:   WFL though with pain at bil knees reporting arthritis in both knees  PALPATION:   General  no TTP, mild fascial restrictions noted in lower abdominal quadrants                 External Perineal Exam no TTP                             Internal Pelvic Floor no TTP  Patient confirms identification and approves PT to assess internal pelvic floor and treatment Yes  PELVIC MMT:   MMT eval 12/08/21   Vaginal 4/5; 5s; 6 reps 4/5; 9s; 10 reps without cues 5/5 strength with 3 reps and 8s isometrics  Internal Anal Sphincter    External Anal Sphincter    Puborectalis    Diastasis Recti    (Blank rows = not tested)        TONE: Increased throughout without pain [facility]  12/08/21   PROLAPSE: Not seen in hooklying however limited by pt's body habitus   TODAY'S  TREATMENT   12/15/2021: X10 Sit to stand from mat table with breathing mechanics and pelvic floor activation cued  X10 #10 Sit to stand from mat table with breathing mechanics and pelvic floor activation cued  Lateral side stepping x10 10# with breathing mechanics and pelvic floor activation cued  Thoracic ext on foam roller 3x30s Pec stretch 3x30s at doorway Standing pelvic floor contraction 2x10   PATIENT EDUCATION:  Education details: [id number] Person educated: Patient Education method: Consulting civil engineer, Demonstration, Tactile cues, Verbal cues, and Handouts Education comprehension: verbalized understanding and returned demonstration   HOME EXERCISE PROGRAM: [id number]  ASSESSMENT:  CLINICAL IMPRESSION: Patient session focused on coordination of pelvic floor breathing mechanics and pelvic floor with activity in sitting and standing with minimal cues needed. Pt reports she is able to feel contractions better in standing and progressed with Sit to stand with weighted resistance. Continues to report improvement with leakage and urgency overall but does still have leakage with standing and walking with first void in morning. Pt would benefit from additional PT to further address deficits.     OBJECTIVE IMPAIRMENTS decreased coordination, decreased endurance, decreased strength, increased fascial restrictions, impaired flexibility, improper body mechanics, and postural dysfunction.   ACTIVITY LIMITATIONS carrying, lifting, squatting, transfers, and continence  PARTICIPATION LIMITATIONS: community activity, occupation, and yard work  PERSONAL FACTORS Time since onset of injury/illness/exacerbation are also affecting patient's functional outcome.   REHAB POTENTIAL: Good  CLINICAL DECISION MAKING: Stable/uncomplicated  EVALUATION COMPLEXITY: Low   GOALS: Goals reviewed with patient? Yes  SHORT TERM GOALS: Target date: 11/04/2021  Pt to be I with HEP.  Baseline: Goal status:  MET  2.  Pt to report improved time between bladder voids to at least 1 hour for improved QOL with decreased urinary frequency and leakage during this time.   Baseline:  Goal status: MET  3.  Pt to demonstrate improved pelvic floor contract/relax/bulge/relax coordination with minimal cues 75% of the time for improved pelvic floor mobility.  Baseline:  Goal status: on going  4.  Pt will report her voids are complete due to improved evacuation techniques and pelvic relaxation.  Baseline:  Goal status: MET   LONG TERM GOALS: Target date:  01/07/22    Pt to be I with advanced HEP.  Baseline:  Goal status: MET  2.  Pt will have 50% less urgency due to bladder retraining and strengthening  Baseline:  Goal status: MET  3.  Pt to report improved time between bladder voids to at least 2 hours for improved QOL with decreased urinary frequency and without leakage during this time.   Baseline:  Goal status: MET  4.  Pt will be able to functional actions such as walking 10 minutes without leakage  Baseline:  Goal status: MET  5.  Pt to demonstrate at least 4/5 pelvic floor strength with ability to hold contraction for at least 8 seconds and then fully relax for improved pelvic stability and decreased strain at pelvic floor/ decrease leakage.  Baseline:  Goal status: INITIAL  6.  Pt to demonstrate improved coordination of pelvic floor and breathing mechanics with 10# squat without leakage.  Baseline:  Goal status: on going  PLAN: PT FREQUENCY: 1x/week  PT DURATION:  8 sessions  PLANNED INTERVENTIONS: Therapeutic exercises, Therapeutic activity, Neuromuscular re-education, Patient/Family education, Self Care, Joint mobilization, Aquatic Therapy, Dry Needling, Spinal mobilization, Cryotherapy, Moist heat, scar mobilization, Taping,  Biofeedback, and Manual therapy  PLAN FOR NEXT SESSION: internal if needed and pt consents, pelvic relaxation as needed, core strengthening, coordination of  breathing and pelvic floor.    Stacy Gardner, PT, DPT 10/16/234:47 PM

## 2021-12-16 LAB — HM MAMMOGRAPHY

## 2021-12-22 ENCOUNTER — Ambulatory Visit: Payer: Commercial Managed Care - HMO | Admitting: Physical Therapy

## 2021-12-24 ENCOUNTER — Encounter: Payer: Self-pay | Admitting: Internal Medicine

## 2021-12-24 NOTE — Progress Notes (Unsigned)
    Subjective:    Patient ID: Darlene [name], female    DOB: 1975/08/11, 46 y.o.   MRN: [id number]      HPI Darlene [name] is here for No chief complaint on file.        Medications and allergies reviewed with patient and updated if appropriate.  Current Outpatient Medications on File Prior to Visit  Medication Sig Dispense Refill   amLODipine (NORVASC) 5 MG tablet Take 1 tablet (5 mg total) by mouth in the morning. 90 tablet 3   glimepiride (AMARYL) 2 MG tablet Take 1 tablet (2 mg total) by mouth in the morning. 90 tablet 3   levocetirizine (XYZAL) 5 MG tablet Take 1 tablet (5 mg total) by mouth daily as needed for allergies (coughing/drainage). 90 tablet 3   losartan-hydrochlorothiazide (HYZAAR) 100-25 MG tablet Take 1 tablet by mouth in the morning. 90 tablet 3   medroxyPROGESTERone (PROVERA) 10 MG tablet Take 10 mg by mouth See admin instructions. Take 1 tablet (10 mg) by mouth for 10 days out of the month     meloxicam (MOBIC) 15 MG tablet Take 1 tablet (15 mg total) by mouth in the morning. 90 tablet 3   metFORMIN (GLUCOPHAGE) 1000 MG tablet Take 1 tablet (1,000 mg total) by mouth in the morning and at bedtime. 90 tablet 3   PARoxetine (PAXIL) 30 MG tablet Take 1 tablet (30 mg total) by mouth in the morning. 90 tablet 3   rosuvastatin (CRESTOR) 10 MG tablet Take 1 tablet (10 mg total) by mouth in the morning. 90 tablet 3   spironolactone (ALDACTONE) 50 MG tablet Take 1 tablet (50 mg total) by mouth in the morning. 90 tablet 3   No current facility-administered medications on file prior to visit.    Review of Systems     Objective:  There were no vitals filed for this visit. BP Readings from Last 3 Encounters:  08/22/21 124/80  09/20/20 (!) 109/56  12/25/19 107/71   Wt Readings from Last 3 Encounters:  08/22/21 (!) 324 lb (147 kg)  09/20/20 (!) 320 lb 15.8 oz (145.6 kg)  12/25/19 (!) 320 lb (145.2 kg)   There is no height or weight on file to calculate BMI.     Physical Exam         Assessment & Plan:    See Problem List for Assessment and Plan of chronic medical problems.

## 2021-12-25 ENCOUNTER — Ambulatory Visit (INDEPENDENT_AMBULATORY_CARE_PROVIDER_SITE_OTHER): Payer: Commercial Managed Care - HMO | Admitting: Internal Medicine

## 2021-12-25 VITALS — BP 134/74 | HR 70 | Temp 98.8°F | Ht 65.0 in | Wt 325.0 lb

## 2021-12-25 DIAGNOSIS — E119 Type 2 diabetes mellitus without complications: Secondary | ICD-10-CM | POA: Diagnosis not present

## 2021-12-25 DIAGNOSIS — I10 Essential (primary) hypertension: Secondary | ICD-10-CM

## 2021-12-25 DIAGNOSIS — J0100 Acute maxillary sinusitis, unspecified: Secondary | ICD-10-CM

## 2021-12-25 DIAGNOSIS — J019 Acute sinusitis, unspecified: Secondary | ICD-10-CM | POA: Insufficient documentation

## 2021-12-25 LAB — POCT GLYCOSYLATED HEMOGLOBIN (HGB A1C)
HbA1c POC (<> result, manual entry): 8.6 % (ref 4.0–5.6)
HbA1c, POC (controlled diabetic range): 8.6 % — AB (ref 0.0–7.0)
HbA1c, POC (prediabetic range): 8.6 % — AB (ref 5.7–6.4)
Hemoglobin A1C: 8.6 % — AB (ref 4.0–5.6)

## 2021-12-25 MED ORDER — AMOXICILLIN-POT CLAVULANATE 875-125 MG PO TABS: 1.0000 | ORAL_TABLET | Freq: Two times a day (BID) | ORAL | 0 refills | Status: DC

## 2021-12-25 MED ORDER — FLUCONAZOLE 150 MG PO TABS: 150.0000 mg | ORAL_TABLET | Freq: Once | ORAL | 0 refills | Status: AC

## 2021-12-25 MED ORDER — MELATONIN 10 MG PO TABS: 10.0000 mg | ORAL_TABLET | Freq: Every evening | ORAL | 11 refills | Status: DC

## 2021-12-25 MED ORDER — TRULICITY 0.75 MG/0.5ML ~~LOC~~ SOAJ: 0.7500 mg | SUBCUTANEOUS | 0 refills | Status: DC

## 2021-12-25 NOTE — Patient Instructions (Addendum)
    Your a1c is 8.6%     Medications changes include :   Augmentin for your sinus infection, diflucan if needed.  Trulicity 7.61 mg weekly.

## 2021-12-25 NOTE — Assessment & Plan Note (Addendum)
Chronic Lab Results  Component Value Date   HGBA1C 8.6 (A) 12/25/2021   HGBA1C 8.6 12/25/2021   HGBA1C 8.6 (A) 12/25/2021   HGBA1C 8.6 (A) 12/25/2021   A1c today 8.6%-discussed with her sugars are not well controlled and the need to get her sugars better controlled Continue metformin 1000 mg twice daily Continue glimepiride 2 mg daily Start Trulicity 4.17 mg injection weekly-increase monthly as tolerated Stressed that she needs to change her lifestyle.  Discussed that her lifestyle is what is going determine how many medications she is on Stressed the importance of weight loss.  Discussed diet changes.  Stressed regular exercise Has follow-up in 3 months

## 2021-12-25 NOTE — Assessment & Plan Note (Signed)
Chronic Blood pressure better on repeat Stressed that she should take her medication when she comes here Advised to start monitoring her BP at home Continue current medications-amlodipine 5 mg daily, losartan-HCTZ 100-25 mg daily, spironolactone 50 mg daily

## 2021-12-25 NOTE — Assessment & Plan Note (Addendum)
Acute Likely bacterial  Start Augmentin 875-125 mg BID x 10 day Diflucan sent to pharmacy in case of a yeast infection otc cold medications Rest, fluid Call if no improvement

## 2021-12-29 ENCOUNTER — Ambulatory Visit: Payer: Commercial Managed Care - HMO | Admitting: Physical Therapy

## 2021-12-29 DIAGNOSIS — M6281 Muscle weakness (generalized): Secondary | ICD-10-CM

## 2021-12-29 DIAGNOSIS — R279 Unspecified lack of coordination: Secondary | ICD-10-CM

## 2021-12-29 NOTE — Therapy (Addendum)
OUTPATIENT PHYSICAL THERAPY FEMALE PELVIC TREATMENT   Patient Name: Darlene [name] MRN: [id number] DOB:05-15-1975, 46 y.o., female Today's Date: 12/29/2021   PT End of Session - 12/29/21 1539     Visit Number 7    Date for PT Re-Evaluation 01/07/22    Authorization Type [employer] [place] [employer]    PT Start Time 6606    PT Stop Time 1600   pt requested to end session early as she did not feel well.   PT Time Calculation (min) 25 min    Activity Tolerance Patient tolerated treatment well    Behavior During Therapy WFL for tasks assessed/performed              Past Medical History:  Diagnosis Date   Acute bronchitis    Chest pain    Colon polyps    Depression    Diabetes mellitus without complication ([place])    Environmental and seasonal allergies    Genital herpes    Hyperlipidemia    Hypertension    Insomnia    Lump or mass in breast    Morbid obesity ([facility])    OSA (obstructive sleep apnea)    Right knee pain    Vaginitis    Vitiligo    Past Surgical History:  Procedure Laterality Date   COLONOSCOPY     COLONOSCOPY WITH PROPOFOL N/A 09/20/2020   Procedure: COLONOSCOPY WITH PROPOFOL;  Surgeon: Carol Ada, MD;  Location: [facility];  Service: Endoscopy;  Laterality: N/A;   WISDOM TOOTH EXTRACTION     Patient Active Problem List   Diagnosis Date Noted   Acute sinus infection 12/25/2021   Diabetes ([place]) 08/22/2021   Recurrent isolated sleep paralysis 01/23/2020   Hypoventilation associated with obesity syndrome ([place]) 12/25/2019   Excessive daytime sleepiness 12/25/2019   Irregular periods 11/20/2019   Polycystic ovary syndrome 11/20/2019   HYPERGLYCEMIA 11/29/2009   EDEMA 11/27/2009   GERD 04/01/2009   BLOOD IN STOOL 04/01/2009   OBSTRUCTIVE SLEEP APNEA 03/27/2009   UNSPECIFIED HEARING LOSS 01/29/2009   GENITAL HERPES, HX OF 01/29/2009   KNEE PAIN, RIGHT 04/16/2008   OBESITY, MORBID 11/17/2006   ABDOMINAL CRAMPS 11/17/2006   Essential hypertension 08/30/2006    Hyperlipidemia 07/20/2006   ANEMIA-NOS 07/20/2006   Depression 07/20/2006   VITILIGO 07/20/2006   INSOMNIA 07/20/2006    PCP: Binnie Rail, MD  REFERRING PROVIDER: Janyth Contes, MD  REFERRING DIAG: N39.46 (ICD-10-CM) - Mixed incontinence  THERAPY DIAG:  Muscle weakness (generalized)  Unspecified lack of coordination  Rationale for Evaluation and Treatment Rehabilitation  ONSET DATE: years, progressively getting worse  SUBJECTIVE:  SUBJECTIVE STATEMENT: Pt reports she is starting to feel better and has been on antibiotics for a week however would like to have a shorter session today because she has started feeling worse throughout the day. Pt had a lot of congestion, coughing and did have leakage with this once with urinating a large amount with a cough after coughing a lot throughout the day. With smaller coughs was able to do contraction of pelvic floor and did not have leakage.  Pt does report in standing is when she feels the most difficulty with holding contraction or stopping a leak. Pt states this is what she would like to   Fluid intake: Yes: does limit water intake due to fear of leakage;   10/30/21 - now drinking more regular fluids with improved confidence per pt   PAIN:  Are you having pain? No   PRECAUTIONS: None  WEIGHT BEARING RESTRICTIONS No  FALLS:  Has patient fallen in last 6 months? No  LIVING ENVIRONMENT: Lives with: lives with their family Lives in: House/apartment   OCCUPATION: medical transportation   PLOF: Independent  PATIENT GOALS to have less leakage  PERTINENT HISTORY:  htn, diabetes, hld, mod-sev OSA, depression Sexual abuse: No  BOWEL MOVEMENT Pain with bowel movement: No Type of bowel movement:Type (Bristol Stool Scale) 4, Frequency  daily, and Strain No Fully empty rectum: Yes:   Leakage: No Pads: No Fiber supplement: No  URINATION Pain with urination: No Fully empty bladder: Yes: but smaller voids usually Stream: Strong and Weak Urgency: Yes: all the time Frequency: sometimes feels like every 10 mins however sometimes a couple hours; usually 1-2x at night (has had leakage at night with dreams of emptying) Leakage: Urge to void, Walking to the bathroom, Coughing, Sneezing, and Lifting Pads: Yes: usually once per day but sometimes 2  INTERCOURSE Pain with intercourse:  none Ability to have vaginal penetration:  Yes:   Climax: not painful Marinoff Scale: 0/3  PREGNANCY Vaginal deliveries 0 Tearing No C-section deliveries 0 Currently pregnant No  PROLAPSE None    OBJECTIVE:   DIAGNOSTIC FINDINGS:   COGNITION:Within functional limits for tasks assessed  Overall cognitive status: Within functional limits for tasks assessed     SENSATION:  Light touch: Appears intact  Proprioception: Appears intact  MUSCLE LENGTH: Hamstrings and adductors limited by 25%                 POSTURE: rounded shoulders and anterior pelvic tilt    LUMBARAROM/PROM  A/PROM A/PROM  eval  Flexion Limited by 50%  Extension Limited by 25%  Right lateral flexion Limited by 25%  Left lateral flexion Limited by 25%  Right rotation Limited by 25%  Left rotation Limited by 25%   (Blank rows = not tested)  LOWER EXTREMITY ROM:   WFL though with pain at bil knees reporting arthritis in both knees  PALPATION:   General  no TTP, mild fascial restrictions noted in lower abdominal quadrants                 External Perineal Exam no TTP                             Internal Pelvic Floor no TTP  Patient confirms identification and approves PT to assess internal pelvic floor and treatment Yes  PELVIC MMT:   MMT eval 12/08/21   Vaginal 4/5; 5s; 6 reps 4/5; 9s; 10 reps without cues 5/5 strength  with 3 reps and 8s  isometrics  Internal Anal Sphincter    External Anal Sphincter    Puborectalis    Diastasis Recti    (Blank rows = not tested)        TONE: Increased throughout without pain [facility] 12/08/21   PROLAPSE: Not seen in hooklying however limited by pt's body habitus   TODAY'S TREATMENT   12/29/21: NMRE: all exercises cued for pelvic floor and breathing mechanical coordination for decreased leakage and improved pelvic floor strength progressions into standing Sit to stand from chair x10   Standing pelvic floor contractions x10 Standing pelvic floor contractions with isometrics x10 10s X5 6 lateral steps (3 each way) with pelvic floor contraction during 3 lateral steps, release, contraction for 3 lateral steps  12/15/2021: X10 Sit to stand from mat table with breathing mechanics and pelvic floor activation cued  X10 #10 Sit to stand from mat table with breathing mechanics and pelvic floor activation cued  Lateral side stepping x10 10# with breathing mechanics and pelvic floor activation cued  Thoracic ext on foam roller 3x30s Pec stretch 3x30s at doorway Standing pelvic floor contraction 2x10   PATIENT EDUCATION:  Education details: [id number] Person educated: Patient Education method: Consulting civil engineer, Demonstration, Tactile cues, Verbal cues, and Handouts Education comprehension: verbalized understanding and returned demonstration   HOME EXERCISE PROGRAM: [id number]  ASSESSMENT:  CLINICAL IMPRESSION: Patient session focused on coordination of pelvic floor breathing mechanics and pelvic floor with activity in standing with minimal cues needed and pt reports she is able to feel contractions but less than when sitting and she needs to take her time/go slower to have correct technique. Pt tolerated well. Pt requested to end session early today due to not feeling well. Pt would benefit from additional PT to further address deficits.     OBJECTIVE IMPAIRMENTS decreased coordination,  decreased endurance, decreased strength, increased fascial restrictions, impaired flexibility, improper body mechanics, and postural dysfunction.   ACTIVITY LIMITATIONS carrying, lifting, squatting, transfers, and continence  PARTICIPATION LIMITATIONS: community activity, occupation, and yard work  PERSONAL FACTORS Time since onset of injury/illness/exacerbation are also affecting patient's functional outcome.   REHAB POTENTIAL: Good  CLINICAL DECISION MAKING: Stable/uncomplicated  EVALUATION COMPLEXITY: Low   GOALS: Goals reviewed with patient? Yes  SHORT TERM GOALS: Target date: 11/04/2021  Pt to be I with HEP.  Baseline: Goal status: MET  2.  Pt to report improved time between bladder voids to at least 1 hour for improved QOL with decreased urinary frequency and leakage during this time.   Baseline:  Goal status: MET  3.  Pt to demonstrate improved pelvic floor contract/relax/bulge/relax coordination with minimal cues 75% of the time for improved pelvic floor mobility.  Baseline:  Goal status: on going  4.  Pt will report her voids are complete due to improved evacuation techniques and pelvic relaxation.  Baseline:  Goal status: MET   LONG TERM GOALS: Target date:  01/07/22    Pt to be I with advanced HEP.  Baseline:  Goal status: MET  2.  Pt will have 50% less urgency due to bladder retraining and strengthening  Baseline:  Goal status: MET  3.  Pt to report improved time between bladder voids to at least 2 hours for improved QOL with decreased urinary frequency and without leakage during this time.   Baseline:  Goal status: MET  4.  Pt will be able to functional actions such as walking 10 minutes without leakage  Baseline:  Goal status: MET  5.  Pt to demonstrate at least 4/5 pelvic floor strength with ability to hold contraction for at least 8 seconds and then fully relax for improved pelvic stability and decreased strain at pelvic floor/ decrease leakage.   Baseline:  Goal status: INITIAL  6.  Pt to demonstrate improved coordination of pelvic floor and breathing mechanics with 10# squat without leakage.  Baseline:  Goal status: on going  PLAN: PT FREQUENCY: 1x/week  PT DURATION:  8 sessions  PLANNED INTERVENTIONS: Therapeutic exercises, Therapeutic activity, Neuromuscular re-education, Patient/Family education, Self Care, Joint mobilization, Aquatic Therapy, Dry Needling, Spinal mobilization, Cryotherapy, Moist heat, scar mobilization, Taping, Biofeedback, and Manual therapy  PLAN FOR NEXT SESSION: internal if needed and pt consents, pelvic relaxation as needed, core strengthening, coordination of breathing and pelvic floor.    Stacy Gardner, PT, DPT 10/30/234:06 PM   PHYSICAL THERAPY DISCHARGE SUMMARY  Visits from Start of Care: 7  Current functional level related to goals / functional outcomes: Unable to formally reassess as pt has been unable to return since last visit   Remaining deficits: Unable to formally reassess   Education / Equipment: HEP   Patient agrees to discharge. Patient goals were partially met. Patient is being discharged due to not returning since the last visit.  Stacy Gardner, PT, DPT 02/18/2310:14 AM

## 2022-01-01 DIAGNOSIS — D259 Leiomyoma of uterus, unspecified: Secondary | ICD-10-CM | POA: Insufficient documentation

## 2022-01-01 DIAGNOSIS — N920 Excessive and frequent menstruation with regular cycle: Secondary | ICD-10-CM | POA: Insufficient documentation

## 2022-01-01 DIAGNOSIS — B369 Superficial mycosis, unspecified: Secondary | ICD-10-CM | POA: Insufficient documentation

## 2022-01-01 DIAGNOSIS — N840 Polyp of corpus uteri: Secondary | ICD-10-CM | POA: Insufficient documentation

## 2022-01-03 DIAGNOSIS — M545 Low back pain, unspecified: Secondary | ICD-10-CM | POA: Insufficient documentation

## 2022-01-08 ENCOUNTER — Encounter: Payer: Self-pay | Admitting: Internal Medicine

## 2022-01-08 NOTE — Progress Notes (Signed)
Outside notes received. Information abstracted. Notes sent to scan.  

## 2022-01-12 ENCOUNTER — Telehealth: Payer: Self-pay | Admitting: Internal Medicine

## 2022-01-12 ENCOUNTER — Other Ambulatory Visit: Payer: Self-pay | Admitting: Internal Medicine

## 2022-01-12 NOTE — Telephone Encounter (Signed)
error 

## 2022-01-19 ENCOUNTER — Other Ambulatory Visit: Payer: Self-pay

## 2022-01-19 ENCOUNTER — Other Ambulatory Visit: Payer: Self-pay | Admitting: Internal Medicine

## 2022-01-19 MED ORDER — TRULICITY 1.5 MG/0.5ML ~~LOC~~ SOAJ: 1.5000 mg | SUBCUTANEOUS | 0 refills | Status: DC

## 2022-02-16 ENCOUNTER — Other Ambulatory Visit: Payer: Self-pay

## 2022-02-16 MED ORDER — DULAGLUTIDE 3 MG/0.5ML ~~LOC~~ SOAJ: 3.0000 mg | SUBCUTANEOUS | 0 refills | Status: DC

## 2022-02-16 MED ORDER — TRULICITY 3 MG/0.5ML ~~LOC~~ SOAJ: 3.0000 mg | SUBCUTANEOUS | 0 refills | Status: DC

## 2022-03-05 ENCOUNTER — Encounter: Payer: Self-pay | Admitting: Internal Medicine

## 2022-03-05 NOTE — Progress Notes (Signed)
Subjective:    Patient ID: Darlene [name], female    DOB: 07/27/1975, 47 y.o.   MRN: [id number]     HPI Shirley is here for follow up of her chronic medical problems, including htn, DM, hld, mod-sev OSA, depression  She has dec appetite and struggles to eat at times.  Has been home - laid off from work right now.  Eats first around 2 or 4 pm.  Usually gets 2 meals in.  Usually has coffee, cinnamon raison bagel butter/cream cheese, grapes or banana or fruit cup.  For dinner meat and collard greens, sometimes rice OR deli sandwich, deli salads    Medications and allergies reviewed with patient and updated if appropriate.  Current Outpatient Medications on File Prior to Visit  Medication Sig Dispense Refill   amLODipine (NORVASC) 5 MG tablet Take 1 tablet (5 mg total) by mouth in the morning. 90 tablet 3   Dulaglutide (TRULICITY) 3 RC/7.8LF SOPN Inject 3 mg as directed once a week. 2 mL 0   fluconazole (DIFLUCAN) 150 MG tablet Take 1 tablet by mouth once.     glimepiride (AMARYL) 2 MG tablet Take 1 tablet (2 mg total) by mouth in the morning. 90 tablet 3   levocetirizine (XYZAL) 5 MG tablet Take 1 tablet (5 mg total) by mouth daily as needed for allergies (coughing/drainage). 90 tablet 3   losartan-hydrochlorothiazide (HYZAAR) 100-25 MG tablet Take 1 tablet by mouth in the morning. 90 tablet 3   medroxyPROGESTERone (PROVERA) 10 MG tablet Take 10 mg by mouth See admin instructions. Take 1 tablet (10 mg) by mouth for 10 days out of the month     Melatonin 10 MG TABS Take 10 mg by mouth at bedtime. 30 tablet 11   meloxicam (MOBIC) 15 MG tablet Take 1 tablet (15 mg total) by mouth in the morning. 90 tablet 3   metFORMIN (GLUCOPHAGE) 1000 MG tablet Take 1 tablet (1,000 mg total) by mouth in the morning and at bedtime. 90 tablet 3   nystatin ointment (MYCOSTATIN) APPLY TO THE AFFECTED AREA(S) BY TOPICAL ROUTE 3 TIMES PER DAY     PARoxetine (PAXIL) 30 MG tablet Take 1 tablet (30 mg total)  by mouth in the morning. 90 tablet 3   rosuvastatin (CRESTOR) 10 MG tablet Take 1 tablet (10 mg total) by mouth in the morning. 90 tablet 3   spironolactone (ALDACTONE) 50 MG tablet Take 1 tablet (50 mg total) by mouth in the morning. 90 tablet 3   No current facility-administered medications on file prior to visit.     Review of Systems  Constitutional:  Negative for fever.  Respiratory:  Negative for cough, shortness of breath and wheezing.   Cardiovascular:  Negative for chest pain, palpitations and leg swelling.  Gastrointestinal:        Occ gerd  Musculoskeletal:  Positive for arthralgias.  Neurological:  Negative for light-headedness and headaches.       Objective:   Vitals:   03/06/22 1412  BP: 128/80  Pulse: (!) 103  Temp: 99.1 F (37.3 C)  SpO2: 98%   BP Readings from Last 3 Encounters:  03/06/22 128/80  12/25/21 134/74  08/22/21 124/80   Wt Readings from Last 3 Encounters:  03/06/22 (!) 311 lb (141.1 kg)  12/25/21 (!) 325 lb (147.4 kg)  08/22/21 (!) 324 lb (147 kg)   Body mass index is 51.75 kg/m.    Physical Exam Constitutional:      General:  She is not in acute distress.    Appearance: Normal appearance.  HENT:     Head: Normocephalic and atraumatic.  Eyes:     Conjunctiva/sclera: Conjunctivae normal.  Cardiovascular:     Rate and Rhythm: Normal rate and regular rhythm.     Heart sounds: Normal heart sounds. No murmur heard. Pulmonary:     Effort: Pulmonary effort is normal. No respiratory distress.     Breath sounds: Normal breath sounds. No wheezing.  Musculoskeletal:     Cervical back: Neck supple.     Right lower leg: No edema.     Left lower leg: No edema.  Lymphadenopathy:     Cervical: No cervical adenopathy.  Skin:    General: Skin is warm and dry.     Findings: No rash.  Neurological:     Mental Status: She is alert. Mental status is at baseline.  Psychiatric:        Mood and Affect: Mood normal.        Behavior: Behavior  normal.        Lab Results  Component Value Date   WBC 8.3 08/22/2021   HGB 13.0 08/22/2021   HCT 39.9 08/22/2021   PLT 279.0 08/22/2021   GLUCOSE 201 (H) 08/22/2021   CHOL 121 08/22/2021   TRIG 108.0 08/22/2021   HDL 45.50 08/22/2021   LDLDIRECT 148.6 02/13/2009   LDLCALC 54 08/22/2021   ALT 21 08/22/2021   AST 19 08/22/2021   NA 140 08/22/2021   K 3.8 08/22/2021   CL 100 08/22/2021   CREATININE 0.91 08/22/2021   BUN 12 08/22/2021   CO2 32 08/22/2021   TSH 1.33 02/13/2009   HGBA1C 8.6 (A) 12/25/2021   HGBA1C 8.6 12/25/2021   HGBA1C 8.6 (A) 12/25/2021   HGBA1C 8.6 (A) 12/25/2021   MICROALBUR 1.4 08/22/2021     Assessment & Plan:    See Problem List for Assessment and Plan of chronic medical problems.

## 2022-03-05 NOTE — Patient Instructions (Addendum)
      Blood work was ordered.   The lab is on the first floor.    Medications changes include :  none      Return in about 3 months (around 06/05/2022) for follow up.

## 2022-03-06 ENCOUNTER — Encounter: Payer: Self-pay | Admitting: Internal Medicine

## 2022-03-06 ENCOUNTER — Ambulatory Visit (INDEPENDENT_AMBULATORY_CARE_PROVIDER_SITE_OTHER): Payer: 59 | Admitting: Internal Medicine

## 2022-03-06 VITALS — BP 128/80 | HR 103 | Temp 99.1°F | Ht 65.0 in | Wt 311.0 lb

## 2022-03-06 DIAGNOSIS — E119 Type 2 diabetes mellitus without complications: Secondary | ICD-10-CM

## 2022-03-06 DIAGNOSIS — I10 Essential (primary) hypertension: Secondary | ICD-10-CM

## 2022-03-06 DIAGNOSIS — E782 Mixed hyperlipidemia: Secondary | ICD-10-CM

## 2022-03-06 DIAGNOSIS — G4733 Obstructive sleep apnea (adult) (pediatric): Secondary | ICD-10-CM

## 2022-03-06 DIAGNOSIS — R69 Illness, unspecified: Secondary | ICD-10-CM | POA: Diagnosis not present

## 2022-03-06 DIAGNOSIS — F3289 Other specified depressive episodes: Secondary | ICD-10-CM

## 2022-03-06 LAB — LIPID PANEL
Cholesterol: 123 mg/dL (ref 0–200)
HDL: 42.7 mg/dL (ref >39.00)
LDL Cholesterol: 58 mg/dL (ref 0–99)
NonHDL: 80.58
Total CHOL/HDL Ratio: 3
Triglycerides: 114 mg/dL (ref 0.0–149.0)
VLDL: 22.8 mg/dL (ref 0.0–40.0)

## 2022-03-06 LAB — COMPREHENSIVE METABOLIC PANEL
ALT: 24 U/L (ref 0–35)
AST: 22 U/L (ref 0–37)
Albumin: 4.4 g/dL (ref 3.5–5.2)
Alkaline Phosphatase: 58 U/L (ref 39–117)
BUN: 9 mg/dL (ref 6–23)
CO2: 29 mEq/L (ref 19–32)
Calcium: 10.1 mg/dL (ref 8.4–10.5)
Chloride: 100 mEq/L (ref 96–112)
Creatinine, Ser: 0.96 mg/dL (ref 0.40–1.20)
GFR: 70.42 mL/min (ref >60.00)
Glucose, Bld: 149 mg/dL — ABNORMAL HIGH (ref 70–99)
Potassium: 3.4 mEq/L — ABNORMAL LOW (ref 3.5–5.1)
Sodium: 139 mEq/L (ref 135–145)
Total Bilirubin: 0.3 mg/dL (ref 0.2–1.2)
Total Protein: 7.6 g/dL (ref 6.0–8.3)

## 2022-03-06 LAB — HEMOGLOBIN A1C: Hgb A1c MFr Bld: 7.2 % — ABNORMAL HIGH (ref 4.6–6.5)

## 2022-03-06 MED ORDER — BLOOD GLUCOSE MONITOR KIT: PACK | 0 refills | Status: AC

## 2022-03-06 NOTE — Assessment & Plan Note (Signed)
Chronic Controlled, Stable Continue Paxil 30 mg daily

## 2022-03-06 NOTE — Assessment & Plan Note (Addendum)
Chronic   Lab Results  Component Value Date   HGBA1C 8.6 (A) 12/25/2021   HGBA1C 8.6 12/25/2021   HGBA1C 8.6 (A) 12/25/2021   HGBA1C 8.6 (A) 12/25/2021   Sugars not controlled Check Q2I Continue Trulicity 3 mg weekly, glimepiride 2 mg daily, metformin 1000 mg twice daily-goal would be to increase Trulicity at her next refill to 4.5 and hopefully be able to get her off the glimepiride Continue weight loss efforts-congratulated on her weight loss Stressed need to increase her activity-not exercising regularly Stressed and decreasing carbohydrate intake-she needs to be more of what she is matches the amount she is eating If the medication is not made you everything for her and she needs to do more with her diet and exercise

## 2022-03-06 NOTE — Assessment & Plan Note (Signed)
Chronic Blood pressure well controlled CMP Continue amlodipine 5 mg daily, losartan-HCTZ 100-25 mg daily, spironolactone 50 mg daily

## 2022-03-06 NOTE — Assessment & Plan Note (Signed)
Chronic Regular exercise and healthy diet encouraged Check lipid panel  Continue Crestor 10 mg daily 

## 2022-03-17 ENCOUNTER — Other Ambulatory Visit: Payer: Self-pay | Admitting: Internal Medicine

## 2022-03-18 MED ORDER — TRULICITY 4.5 MG/0.5ML ~~LOC~~ SOAJ: 4.5000 mg | SUBCUTANEOUS | 1 refills | Status: DC

## 2022-03-19 ENCOUNTER — Encounter: Payer: Self-pay | Admitting: Internal Medicine

## 2022-03-23 ENCOUNTER — Other Ambulatory Visit: Payer: Self-pay | Admitting: Internal Medicine

## 2022-03-27 ENCOUNTER — Telehealth: Payer: Self-pay

## 2022-03-27 NOTE — Telephone Encounter (Signed)
Key: [id number]

## 2022-04-23 NOTE — Progress Notes (Signed)
Received pt's pcp medical clearance from Dr Terri Piedra office via fax, placed in chart.

## 2022-04-29 DIAGNOSIS — Z01812 Encounter for preprocedural laboratory examination: Secondary | ICD-10-CM | POA: Diagnosis not present

## 2022-05-05 ENCOUNTER — Encounter (HOSPITAL_BASED_OUTPATIENT_CLINIC_OR_DEPARTMENT_OTHER): Payer: Self-pay | Admitting: Obstetrics and Gynecology

## 2022-05-05 NOTE — H&P (Signed)
Darlene [name] is an 47 y.o. G40  female here for hysteroscopy with D&C, possible myosure of uterine polyp due to menorrhagia and thickened endometrium. US done 12/2021 showed thickened lining with suspected uterine polyps.  LMP 03/12/2022 heavy; irregular, passage of clots   She is not on any hormonal contraception.    Hx of T2DM - last HgA1c was 8.6    Pertinent Gynecological History: Menses: flow is excessive with use of >8 pads or tampons on heaviest days Bleeding: heavy Contraception: none DES exposure: denies Blood transfusions: none Sexually transmitted diseases: no past history Previous GYN Procedures:  none    Menstrual History: Menarche age: 40 Patient's last menstrual period was 03/29/2022 (exact date).    Past Medical History:  Diagnosis Date   Arthritis    hands, low back   Depression    Environmental and seasonal allergies    Gallstones    05-05-2022  per pt no issues currently   Genital herpes    GERD (gastroesophageal reflux disease)    05-05-2022  per pt watches diet , not meds   Hyperlipidemia    Hypertension    Insomnia    Menorrhagia    Mixed incontinence urge and stress    Morbid obesity ([place])    OSA (obstructive sleep apnea) 2011   (05-05-2022  per pt tried  cpap but intolerent and did not follow up)  first dx 2011--- last sleep study done by dr dohmeier 01-17-2020 moderate to severe osa,  cpap on back order ,  finally received 04/ 2022   Type 2 diabetes mellitus ([place])    followed by pcp;   (05-05-2022  per pt check blood sugar 3-4 times daily,  fasting average blood sugar 108-115)   Uterine leiomyoma    Vitiligo    Wears contact lenses     Past Surgical History:  Procedure Laterality Date   COLONOSCOPY WITH PROPOFOL N/A 09/20/2020   Procedure: COLONOSCOPY WITH PROPOFOL;  Surgeon: Carol Ada, MD;  Location: [facility];  Service: Endoscopy;  Laterality: N/A;   WISDOM TOOTH EXTRACTION      Family History  Problem Relation Age of Onset    Breast cancer Mother    Ovarian cysts Mother    Hypertension Mother    Diabetes Mother    Stroke Father    Diabetes Father    Cancer Father        bone marrow    Social History:  reports that she has never smoked. She has never used smokeless tobacco. She reports that she does not currently use alcohol. She reports that she does not use drugs.  Allergies:  Allergies  Allergen Reactions   Yeast-Related Products Other (See Comments)    Per allergy test   Apple Rash   Latex Rash   Rice Rash   Tomato Rash and Other (See Comments)    Itchy throat (rash around mouth)   Wound Dressing Adhesive Rash    No medications prior to admission.    Review of Systems  Constitutional:  Positive for fatigue. Negative for activity change.  Eyes:  Negative for visual disturbance.  Respiratory:  Negative for chest tightness and shortness of breath.   Cardiovascular:  Negative for chest pain, palpitations and leg swelling.  Gastrointestinal:  Positive for abdominal pain.  Genitourinary:  Positive for pelvic pain.  Musculoskeletal:  Negative for back pain.  Psychiatric/Behavioral:  The patient is not nervous/anxious.     Height '5\' 5"'$  (1.651 m), weight (!) 139.7 kg, last  menstrual period 03/29/2022. Physical Exam Vitals and nursing note reviewed. Exam conducted with a chaperone present.  Constitutional:      Appearance: Normal appearance. She is normal weight.  Cardiovascular:     Rate and Rhythm: Normal rate.     Pulses: Normal pulses.  Pulmonary:     Effort: Pulmonary effort is normal.  Abdominal:     Palpations: Abdomen is soft.  Musculoskeletal:        General: Normal range of motion.     Cervical back: Normal range of motion.  Skin:    General: Skin is warm and dry.     Capillary Refill: Capillary refill takes 2 to 3 seconds.  Neurological:     General: No focal deficit present.     Mental Status: She is alert and oriented to person, place, and time. Mental status is at  baseline.  Psychiatric:        Mood and Affect: Mood normal.        Behavior: Behavior normal.        Thought Content: Thought content normal.        Judgment: Judgment normal.     No results found for this or any previous visit (from the past 24 hour(s)).  No results found.  Assessment/Plan: 47yo G0 female with thickened endometrium, suspected uterine polyps, menorrhagia here for hysteroscopy with dilation and curettage and possible myosure removal of uterine polyps.  - Admit - Verify consent - To OR when ready   Isaiah Serge 05/05/2022, 6:51 PM

## 2022-05-05 NOTE — Progress Notes (Signed)
Spoke w/ via phone for pre-op interview--- pt Lab needs dos---- cbc, t&s, istat, EKG, urine preg             Lab results------ no COVID test -----patient states asymptomatic no test needed Arrive at ------- 0700 on 05-07-2022 NPO after MN NO Solid Food.  Clear liquids (only water) from MN until--- 0600 Med rec completed Medications to take morning of surgery ----- paxil, crestor, norvasc Diabetic medication ----- do not take metformin, amaryl morning of surgery.  And pt stated was not given instructions about stopping trulicity week prior to surgery.  However, pt takes on Sunday's and had forgotten to do on 04-26-2022 so did on Wednesday 04-29-2022. She was planning on doing it today but informed pt on why not do prior to surgery on 05-07-2022, pt verbalized understanding. Patient instructed no nail polish to be worn day of surgery Patient instructed to bring photo id and insurance card day of surgery Patient aware to have Driver (ride ) / caregiver    for 24 hours after surgery --- mother, Darlene [name] Patient Special Instructions ----- n/a Pre-Op special Istructions -----  received pt's pcp, Dr Jenness Corner, medical clearance via fax from Dr Terri Piedra office, placed in chart. Patient verbalized understanding of instructions that were given at this phone interview. Patient denies shortness of breath, chest pain, fever, cough at this phone interview.

## 2022-05-06 NOTE — Anesthesia Preprocedure Evaluation (Addendum)
Anesthesia Evaluation  Patient identified by MRN, date of birth, ID band Patient awake    Reviewed: Allergy & Precautions, NPO status , Patient's Chart, lab work & pertinent test results  History of Anesthesia Complications Negative for: history of anesthetic complications  Airway Mallampati: III  TM Distance: >3 FB Neck ROM: Full    Dental  (+) Dental Advisory Given, Missing,    Pulmonary sleep apnea (noncompliant)    Pulmonary exam normal        Cardiovascular hypertension, Pt. on medications Normal cardiovascular exam     Neuro/Psych  PSYCHIATRIC DISORDERS  Depression    negative neurological ROS     GI/Hepatic Neg liver ROS,GERD  Controlled,,  Endo/Other  diabetes, Type 2, Oral Hypoglycemic Agents  Morbid obesity On GLP-1a   Renal/GU negative Renal ROS  Female GU complaint     Musculoskeletal  (+) Arthritis ,    Abdominal  (+) + obese  Peds  Hematology negative hematology ROS (+)   Anesthesia Other Findings HSV  Reproductive/Obstetrics  PCOS Uterine leiomyoma                              Anesthesia Physical Anesthesia Plan  ASA: 3  Anesthesia Plan: General   Post-op Pain Management: Tylenol PO (pre-op)*   Induction: Intravenous  PONV Risk Score and Plan: 3 and Treatment may vary due to age or medical condition, Ondansetron, Dexamethasone and Midazolam  Airway Management Planned: LMA  Additional Equipment: None  Intra-op Plan:   Post-operative Plan: Extubation in OR  Informed Consent: I have reviewed the patients History and Physical, chart, labs and discussed the procedure including the risks, benefits and alternatives for the proposed anesthesia with the patient or authorized representative who has indicated his/her understanding and acceptance.     Dental advisory given  Plan Discussed with: CRNA and Anesthesiologist  Anesthesia Plan Comments:         Anesthesia Quick Evaluation

## 2022-05-07 ENCOUNTER — Ambulatory Visit (HOSPITAL_BASED_OUTPATIENT_CLINIC_OR_DEPARTMENT_OTHER): Payer: 59 | Admitting: Anesthesiology

## 2022-05-07 ENCOUNTER — Encounter (HOSPITAL_BASED_OUTPATIENT_CLINIC_OR_DEPARTMENT_OTHER): Payer: Self-pay | Admitting: Obstetrics and Gynecology

## 2022-05-07 ENCOUNTER — Ambulatory Visit (HOSPITAL_BASED_OUTPATIENT_CLINIC_OR_DEPARTMENT_OTHER)
Admission: RE | Admit: 2022-05-07 | Discharge: 2022-05-07 | Disposition: A | Discharge status: Home / Self Care | Payer: 59 | Attending: Obstetrics and Gynecology | Admitting: Obstetrics and Gynecology

## 2022-05-07 ENCOUNTER — Encounter (HOSPITAL_BASED_OUTPATIENT_CLINIC_OR_DEPARTMENT_OTHER)
Admission: RE | Disposition: A | Discharge status: Home / Self Care | Payer: Self-pay | Source: Home / Self Care | Attending: Obstetrics and Gynecology

## 2022-05-07 ENCOUNTER — Other Ambulatory Visit: Payer: Self-pay

## 2022-05-07 DIAGNOSIS — D259 Leiomyoma of uterus, unspecified: Secondary | ICD-10-CM | POA: Diagnosis not present

## 2022-05-07 DIAGNOSIS — N920 Excessive and frequent menstruation with regular cycle: Secondary | ICD-10-CM | POA: Diagnosis not present

## 2022-05-07 DIAGNOSIS — N840 Polyp of corpus uteri: Secondary | ICD-10-CM | POA: Diagnosis not present

## 2022-05-07 DIAGNOSIS — Z8249 Family history of ischemic heart disease and other diseases of the circulatory system: Secondary | ICD-10-CM | POA: Insufficient documentation

## 2022-05-07 DIAGNOSIS — C541 Malignant neoplasm of endometrium: Secondary | ICD-10-CM | POA: Insufficient documentation

## 2022-05-07 DIAGNOSIS — Z6841 Body Mass Index (BMI) 40.0 and over, adult: Secondary | ICD-10-CM | POA: Diagnosis not present

## 2022-05-07 DIAGNOSIS — E119 Type 2 diabetes mellitus without complications: Secondary | ICD-10-CM

## 2022-05-07 DIAGNOSIS — G4733 Obstructive sleep apnea (adult) (pediatric): Secondary | ICD-10-CM | POA: Diagnosis not present

## 2022-05-07 DIAGNOSIS — N946 Dysmenorrhea, unspecified: Secondary | ICD-10-CM | POA: Diagnosis not present

## 2022-05-07 DIAGNOSIS — Z833 Family history of diabetes mellitus: Secondary | ICD-10-CM | POA: Diagnosis not present

## 2022-05-07 DIAGNOSIS — Z7984 Long term (current) use of oral hypoglycemic drugs: Secondary | ICD-10-CM | POA: Diagnosis not present

## 2022-05-07 DIAGNOSIS — Z01818 Encounter for other preprocedural examination: Secondary | ICD-10-CM

## 2022-05-07 DIAGNOSIS — I10 Essential (primary) hypertension: Secondary | ICD-10-CM | POA: Diagnosis not present

## 2022-05-07 DIAGNOSIS — M1990 Unspecified osteoarthritis, unspecified site: Secondary | ICD-10-CM | POA: Insufficient documentation

## 2022-05-07 DIAGNOSIS — E282 Polycystic ovarian syndrome: Secondary | ICD-10-CM | POA: Diagnosis not present

## 2022-05-07 HISTORY — DX: Mixed incontinence: N39.46

## 2022-05-07 HISTORY — DX: Calculus of gallbladder without cholecystitis without obstruction: K80.20

## 2022-05-07 HISTORY — DX: Type 2 diabetes mellitus without complications: E11.9

## 2022-05-07 HISTORY — DX: Gastro-esophageal reflux disease without esophagitis: K21.9

## 2022-05-07 HISTORY — DX: Excessive and frequent menstruation with regular cycle: N92.0

## 2022-05-07 HISTORY — DX: Unspecified osteoarthritis, unspecified site: M19.90

## 2022-05-07 HISTORY — DX: Presence of spectacles and contact lenses: Z97.3

## 2022-05-07 HISTORY — PX: DILATATION & CURETTAGE/HYSTEROSCOPY WITH MYOSURE: SHX6511

## 2022-05-07 HISTORY — DX: Leiomyoma of uterus, unspecified: D25.9

## 2022-05-07 LAB — POCT I-STAT, CHEM 8
BUN: 11 mg/dL (ref 6–20)
Calcium, Ion: 1.18 mmol/L (ref 1.15–1.40)
Chloride: 100 mmol/L (ref 98–111)
Creatinine, Ser: 1 mg/dL (ref 0.44–1.00)
Glucose, Bld: 124 mg/dL — ABNORMAL HIGH (ref 70–99)
HCT: 39 % (ref 36.0–46.0)
Hemoglobin: 13.3 g/dL (ref 12.0–15.0)
Potassium: 3.2 mmol/L — ABNORMAL LOW (ref 3.5–5.1)
Sodium: 140 mmol/L (ref 135–145)
TCO2: 28 mmol/L (ref 22–32)

## 2022-05-07 LAB — CBC
HCT: 39.3 % (ref 36.0–46.0)
Hemoglobin: 12.9 g/dL (ref 12.0–15.0)
MCH: 29.5 pg (ref 26.0–34.0)
MCHC: 32.8 g/dL (ref 30.0–36.0)
MCV: 89.9 fL (ref 80.0–100.0)
Platelets: 241 10*3/uL (ref 150–400)
RBC: 4.37 MIL/uL (ref 3.87–5.11)
RDW: 12.6 % (ref 11.5–15.5)
WBC: 6.5 10*3/uL (ref 4.0–10.5)
nRBC: 0 % (ref 0.0–0.2)

## 2022-05-07 LAB — POCT PREGNANCY, URINE: Preg Test, Ur: NEGATIVE

## 2022-05-07 LAB — TYPE AND SCREEN
ABO/RH(D): O POS
Antibody Screen: NEGATIVE

## 2022-05-07 LAB — ABO/RH: ABO/RH(D): O POS

## 2022-05-07 LAB — GLUCOSE, CAPILLARY: Glucose-Capillary: 130 mg/dL — ABNORMAL HIGH (ref 70–99)

## 2022-05-07 SURGERY — DILATATION & CURETTAGE/HYSTEROSCOPY WITH MYOSURE
Anesthesia: General | Site: Vagina

## 2022-05-07 MED ORDER — LIDOCAINE 2% (20 MG/ML) 5 ML SYRINGE
INTRAMUSCULAR | Status: DC | PRN
  Administered 2022-05-07: 80 mg via INTRAVENOUS

## 2022-05-07 MED ORDER — FENTANYL CITRATE (PF) 100 MCG/2ML IJ SOLN: 25.0000 ug | INTRAMUSCULAR | Status: DC | PRN

## 2022-05-07 MED ORDER — ACETAMINOPHEN 500 MG PO TABS
ORAL_TABLET | ORAL | Status: AC
  Filled 2022-05-07: qty 2

## 2022-05-07 MED ORDER — FENTANYL CITRATE (PF) 100 MCG/2ML IJ SOLN
INTRAMUSCULAR | Status: AC
  Filled 2022-05-07: qty 2

## 2022-05-07 MED ORDER — LACTATED RINGERS IV SOLN: INTRAVENOUS | Status: DC

## 2022-05-07 MED ORDER — MIDAZOLAM HCL 2 MG/2ML IJ SOLN
INTRAMUSCULAR | Status: AC
  Filled 2022-05-07: qty 2

## 2022-05-07 MED ORDER — FENTANYL CITRATE (PF) 100 MCG/2ML IJ SOLN
INTRAMUSCULAR | Status: DC | PRN
  Administered 2022-05-07 (×2): 25 ug via INTRAVENOUS
  Administered 2022-05-07: 50 ug via INTRAVENOUS

## 2022-05-07 MED ORDER — DEXAMETHASONE SODIUM PHOSPHATE 10 MG/ML IJ SOLN
INTRAMUSCULAR | Status: DC | PRN
  Administered 2022-05-07: 10 mg via INTRAVENOUS

## 2022-05-07 MED ORDER — KETOROLAC TROMETHAMINE 30 MG/ML IJ SOLN
INTRAMUSCULAR | Status: AC
  Filled 2022-05-07: qty 1

## 2022-05-07 MED ORDER — MIDAZOLAM HCL 5 MG/5ML IJ SOLN
INTRAMUSCULAR | Status: DC | PRN
  Administered 2022-05-07: 2 mg via INTRAVENOUS

## 2022-05-07 MED ORDER — DEXAMETHASONE SODIUM PHOSPHATE 10 MG/ML IJ SOLN
INTRAMUSCULAR | Status: AC
  Filled 2022-05-07: qty 1

## 2022-05-07 MED ORDER — KETOROLAC TROMETHAMINE 30 MG/ML IJ SOLN: 30.0000 mg | Freq: Once | INTRAMUSCULAR | Status: DC

## 2022-05-07 MED ORDER — PROPOFOL 10 MG/ML IV BOLUS
INTRAVENOUS | Status: AC
  Filled 2022-05-07: qty 40

## 2022-05-07 MED ORDER — POVIDONE-IODINE 10 % EX SWAB: 2.0000 | Freq: Once | CUTANEOUS | Status: DC

## 2022-05-07 MED ORDER — OXYCODONE HCL 5 MG/5ML PO SOLN: 5.0000 mg | Freq: Once | ORAL | Status: AC | PRN

## 2022-05-07 MED ORDER — OXYCODONE-ACETAMINOPHEN 5-325 MG PO TABS: 1.0000 | ORAL_TABLET | ORAL | 0 refills | Status: DC | PRN

## 2022-05-07 MED ORDER — OXYCODONE HCL 5 MG PO TABS
ORAL_TABLET | ORAL | Status: AC
  Filled 2022-05-07: qty 1

## 2022-05-07 MED ORDER — DEXMEDETOMIDINE HCL IN NACL 80 MCG/20ML IV SOLN
INTRAVENOUS | Status: DC | PRN
  Administered 2022-05-07: 8 ug via BUCCAL

## 2022-05-07 MED ORDER — LACTATED RINGERS IV SOLN
INTRAVENOUS | Status: DC
  Administered 2022-05-07: 1000 mL via INTRAVENOUS

## 2022-05-07 MED ORDER — PROPOFOL 10 MG/ML IV BOLUS
INTRAVENOUS | Status: DC | PRN
  Administered 2022-05-07: 200 mg via INTRAVENOUS

## 2022-05-07 MED ORDER — ACETAMINOPHEN 500 MG PO TABS
1000.0000 mg | ORAL_TABLET | ORAL | Status: AC
  Administered 2022-05-07: 1000 mg via ORAL

## 2022-05-07 MED ORDER — OXYCODONE HCL 5 MG PO TABS
5.0000 mg | ORAL_TABLET | Freq: Once | ORAL | Status: AC | PRN
  Administered 2022-05-07: 5 mg via ORAL

## 2022-05-07 MED ORDER — KETOROLAC TROMETHAMINE 30 MG/ML IJ SOLN
INTRAMUSCULAR | Status: DC | PRN
  Administered 2022-05-07: 30 mg via INTRAVENOUS

## 2022-05-07 MED ORDER — LIDOCAINE HCL 1 % IJ SOLN
INTRAMUSCULAR | Status: DC | PRN
  Administered 2022-05-07: 10 mL

## 2022-05-07 MED ORDER — LIDOCAINE HCL (PF) 2 % IJ SOLN
INTRAMUSCULAR | Status: AC
  Filled 2022-05-07: qty 5

## 2022-05-07 MED ORDER — ONDANSETRON HCL 4 MG/2ML IJ SOLN
INTRAMUSCULAR | Status: DC | PRN
  Administered 2022-05-07: 4 mg via INTRAVENOUS

## 2022-05-07 MED ORDER — SODIUM CHLORIDE 0.9 % IR SOLN
Status: DC | PRN
  Administered 2022-05-07 (×2): 3000 mL

## 2022-05-07 MED ORDER — IBUPROFEN 600 MG PO TABS: 600.0000 mg | ORAL_TABLET | Freq: Four times a day (QID) | ORAL | 0 refills | Status: DC | PRN

## 2022-05-07 MED ORDER — ONDANSETRON HCL 4 MG/2ML IJ SOLN
INTRAMUSCULAR | Status: AC
  Filled 2022-05-07: qty 2

## 2022-05-07 MED ORDER — PROMETHAZINE HCL 25 MG/ML IJ SOLN: 6.2500 mg | INTRAMUSCULAR | Status: DC | PRN

## 2022-05-07 SURGICAL SUPPLY — 22 items
CATH ROBINSON RED A/P 16FR (CATHETERS) ×1 IMPLANT
DEVICE MYOSURE LITE (MISCELLANEOUS) IMPLANT
DEVICE MYOSURE REACH (MISCELLANEOUS) IMPLANT
DILATOR CANAL MILEX (MISCELLANEOUS) IMPLANT
DRSG TELFA 3X8 NADH STRL (GAUZE/BANDAGES/DRESSINGS) ×1 IMPLANT
GAUZE 4X4 16PLY ~~LOC~~+RFID DBL (SPONGE) ×2 IMPLANT
GLOVE BIO SURGEON STRL SZ 6.5 (GLOVE) ×1 IMPLANT
GOWN STRL REUS W/TWL LRG LVL3 (GOWN DISPOSABLE) ×1 IMPLANT
IV NS IRRIG 3000ML ARTHROMATIC (IV SOLUTION) ×2 IMPLANT
KIT PROCEDURE FLUENT (KITS) ×1 IMPLANT
KIT TURNOVER CYSTO (KITS) ×1 IMPLANT
MYOSURE XL FIBROID (MISCELLANEOUS)
NDL SPNL 18GX3.5 QUINCKE PK (NEEDLE) IMPLANT
NEEDLE SPNL 18GX3.5 QUINCKE PK (NEEDLE) IMPLANT
PACK VAGINAL MINOR WOMEN LF (CUSTOM PROCEDURE TRAY) ×1 IMPLANT
PAD OB MATERNITY 4.3X12.25 (PERSONAL CARE ITEMS) ×1 IMPLANT
SEAL CERVICAL OMNI LOK (ABLATOR) IMPLANT
SEAL ROD LENS SCOPE MYOSURE (ABLATOR) ×1 IMPLANT
SLEEVE SCD COMPRESS KNEE MED (STOCKING) ×1 IMPLANT
SYSTEM TISS REMOVAL MYOSURE XL (MISCELLANEOUS) IMPLANT
TOWEL OR 17X24 6PK STRL BLUE (TOWEL DISPOSABLE) ×1 IMPLANT
WATER STERILE IRR 500ML POUR (IV SOLUTION) IMPLANT

## 2022-05-07 NOTE — Transfer of Care (Signed)
Immediate Anesthesia Transfer of Care Note  Patient: Darlene [name]  Procedure(s) Performed: DILATATION & CURETTAGE/HYSTEROSCOPY WITH MYOSURE (Vagina )  Patient Location: PACU  Anesthesia Type:General  Level of Consciousness: awake, alert , and oriented  Airway & Oxygen Therapy: Patient Spontanous Breathing and Patient connected to nasal cannula oxygen  Post-op Assessment: Report given to RN and Post -op Vital signs reviewed and stable  Post vital signs: Reviewed and stable  Last Vitals:  Vitals Value Taken Time  BP 160/104 05/07/22 0958  Temp    Pulse 81 05/07/22 0959  Resp 9 05/07/22 0959  SpO2 100 % 05/07/22 0959  Vitals shown include unvalidated device data.  Last Pain:  Vitals:   05/07/22 0743  TempSrc:   PainSc: 0-No pain      Patients Stated Pain Goal: 5 ([id number])  Complications: No notable events documented.

## 2022-05-07 NOTE — Anesthesia Postprocedure Evaluation (Signed)
Anesthesia Post Note  Patient: [place]  Procedure(s) Performed: DILATATION & CURETTAGE/HYSTEROSCOPY WITH MYOSURE (Vagina )     Patient location during evaluation: PACU Anesthesia Type: General Level of consciousness: awake and alert Pain management: pain level controlled Vital Signs Assessment: post-procedure vital signs reviewed and stable Respiratory status: spontaneous breathing, nonlabored ventilation and respiratory function stable Cardiovascular status: stable and blood pressure returned to baseline Anesthetic complications: no   No notable events documented.  Last Vitals:  Vitals:   05/07/22 1017 05/07/22 1105  BP: 91/72 119/75  Pulse: 65 64  Resp: 15 15  Temp:  36.8 C  SpO2: 92% 95%    Last Pain:  Vitals:   05/07/22 1105  TempSrc:   PainSc: [place]

## 2022-05-07 NOTE — Anesthesia Procedure Notes (Signed)
Procedure Name: LMA Insertion Date/Time: 05/07/2022 9:06 AM  Performed by: Abiola Behring D, CRNAPre-anesthesia Checklist: Patient identified, Emergency Drugs available, Suction available and Patient being monitored Patient Re-evaluated:Patient Re-evaluated prior to induction Oxygen Delivery Method: Circle system utilized Preoxygenation: Pre-oxygenation with 100% oxygen Induction Type: IV induction Ventilation: Mask ventilation without difficulty LMA: LMA with gastric port inserted LMA Size: 4.0 Tube type: Oral Number of attempts: 1 Placement Confirmation: positive ETCO2 and breath sounds checked- equal and bilateral Tube secured with: Tape Dental Injury: Teeth and Oropharynx as per pre-operative assessment

## 2022-05-07 NOTE — Interval H&P Note (Signed)
History and Physical Interval Note: Pt reports no change since H/P done.  Reviewed procedure and expectations Consent confirmed To OR when ready   05/07/2022 8:44 AM  Darlene [name]  has presented today for surgery, with the diagnosis of menorrhagia, polyp, uterine leiomyoma.  The various methods of treatment have been discussed with the patient and family. After consideration of risks, benefits and other options for treatment, the patient has consented to  Procedure(s): [place] (N/A) as a surgical intervention.  The patient's history has been reviewed, patient examined, no change in status, stable for surgery.  I have reviewed the patient's chart and labs.  Questions were answered to the patient's satisfaction.     Isaiah Serge

## 2022-05-07 NOTE — Op Note (Signed)
Operative Note    Preoperative Diagnosis Uterine polyps Menorrhagia  Dysmenorrhea   Postoperative Diagnosis: Same   Procedure: Hysteroscopic removal of uterine polyps using myosure, dilation and curettage of uterus   Surgeon: Terri Piedra, C DO   Anesthesia: General  Fluids: 682m EBL: <25mUOP: voided prior to OR Fluid deficit: 40567mFindings: Multiple uterine polyps filling entire uterine cavity largest from anterior uterine wall. Both ostia in normal anatomic position   Specimen: Uterine polyps and endometrial curettings   Procedure Note Patient was taken to the operating room where general  anesthesia was administered without difficulty. She was then prepped and draped in the normal sterile fashion in the dorsal lithotomy position. An appropriate time out was performed.  A speculum was then placed in the vagina. The anterior lip of the cervix was grasped with a single toothed tenaculum and 1% lidocaine plain injected at 3 and 9 o'clock for a paracervical block.  The uterus was then sounded to approximately 8 cm and the [facility] utilized to dilate the cervix up to size 19.  The Myosure operating scope was then introduced into the uterine cavity. The cavity was noted to have a large protruding polyp coming from the anterior wall.  Several smaller polyps were also noted from the lateral and posterior walls. Both ostia were in normal anatomic position.  Next, using the myosure reach, the large polyp was removed in its entirety. More endometrial polyps were revealed in the upper and posterior uterine cavity. Using the [facility], all polyps were removed. The cavity was then curettaged and the scope replaced. A few smaller fibroids were also then successfully removed with a clear cavity remaining.  There was no active bleeding noted. The samples were handed off to be sent to pathology.  At this time, all instruments were removed. The tenaculum site was noted to be hemostatic.   Finally the speculum was removed from the vagina and the patient awakened and taken to the recovery room in stable condition.   All counts were noted to be correct x 2

## 2022-05-07 NOTE — Discharge Instructions (Addendum)
Call office with any concerns [phone]       No acetaminophen/Tylenol until after 2:30 pm today if needed.  No ibuprofen, Advil, Aleve, Motrin, ketorolac, meloxicam, naproxen, or other NSAIDS until after 3:45 pm today if needed.   Post Anesthesia Home Care Instructions  Activity: Get plenty of rest for the remainder of the day. A responsible individual must stay with you for 24 hours following the procedure.  For the next 24 hours, DO NOT: -Drive a car -Paediatric nurse -Drink alcoholic beverages -Take any medication unless instructed by your physician -Make any legal decisions or sign important papers.  Meals: Start with liquid foods such as gelatin or soup. Progress to regular foods as tolerated. Avoid greasy, spicy, heavy foods. If nausea and/or vomiting occur, drink only clear liquids until the nausea and/or vomiting subsides. Call your physician if vomiting continues.  Special Instructions/Symptoms: Your throat may feel dry or sore from the anesthesia or the breathing tube placed in your throat during surgery. If this causes discomfort, gargle with warm salt water. The discomfort should disappear within 24 hours.

## 2022-05-08 ENCOUNTER — Encounter (HOSPITAL_BASED_OUTPATIENT_CLINIC_OR_DEPARTMENT_OTHER): Payer: Self-pay | Admitting: Obstetrics and Gynecology

## 2022-05-12 DIAGNOSIS — Z6841 Body Mass Index (BMI) 40.0 and over, adult: Secondary | ICD-10-CM | POA: Diagnosis not present

## 2022-05-12 DIAGNOSIS — N946 Dysmenorrhea, unspecified: Secondary | ICD-10-CM | POA: Diagnosis not present

## 2022-05-12 DIAGNOSIS — C549 Malignant neoplasm of corpus uteri, unspecified: Secondary | ICD-10-CM | POA: Diagnosis not present

## 2022-05-13 LAB — SURGICAL PATHOLOGY

## 2022-05-14 ENCOUNTER — Telehealth: Payer: Self-pay | Admitting: *Deleted

## 2022-05-14 ENCOUNTER — Encounter: Payer: Self-pay | Admitting: Gynecologic Oncology

## 2022-05-14 NOTE — Telephone Encounter (Signed)
Spoke with the patient regarding the referral to GYN oncology. Patient scheduled as new patient with Dr Berline Lopes on 3/14 at 9:45 am.  Patient given an arrival time of 9:15 am.  Explained to the patient the the doctor will perform a pelvic exam at this visit. Patient given the policy that only one visitor allowed and that visitor must be over 16 yrs are allowed in the [place]. Patient given the address/phone number for the clinic and that the center offers free valet service. Patient aware of the mask mandate.

## 2022-05-15 ENCOUNTER — Encounter: Payer: Self-pay | Admitting: Gynecologic Oncology

## 2022-05-15 ENCOUNTER — Inpatient Hospital Stay: Payer: 59 | Attending: Gynecologic Oncology | Admitting: Gynecologic Oncology

## 2022-05-15 ENCOUNTER — Inpatient Hospital Stay: Payer: 59

## 2022-05-15 ENCOUNTER — Other Ambulatory Visit (HOSPITAL_COMMUNITY): Payer: Self-pay

## 2022-05-15 VITALS — BP 145/88 | HR 77 | Temp 99.0°F | Resp 16 | Ht 63.5 in | Wt 306.2 lb

## 2022-05-15 DIAGNOSIS — Z30430 Encounter for insertion of intrauterine contraceptive device: Secondary | ICD-10-CM | POA: Insufficient documentation

## 2022-05-15 DIAGNOSIS — Z803 Family history of malignant neoplasm of breast: Secondary | ICD-10-CM | POA: Diagnosis not present

## 2022-05-15 DIAGNOSIS — Z3202 Encounter for pregnancy test, result negative: Secondary | ICD-10-CM | POA: Insufficient documentation

## 2022-05-15 DIAGNOSIS — C541 Malignant neoplasm of endometrium: Secondary | ICD-10-CM | POA: Insufficient documentation

## 2022-05-15 DIAGNOSIS — E785 Hyperlipidemia, unspecified: Secondary | ICD-10-CM | POA: Insufficient documentation

## 2022-05-15 DIAGNOSIS — E662 Morbid (severe) obesity with alveolar hypoventilation: Secondary | ICD-10-CM

## 2022-05-15 DIAGNOSIS — Z79899 Other long term (current) drug therapy: Secondary | ICD-10-CM | POA: Diagnosis not present

## 2022-05-15 DIAGNOSIS — M1990 Unspecified osteoarthritis, unspecified site: Secondary | ICD-10-CM | POA: Diagnosis not present

## 2022-05-15 DIAGNOSIS — Z7985 Long-term (current) use of injectable non-insulin antidiabetic drugs: Secondary | ICD-10-CM | POA: Insufficient documentation

## 2022-05-15 DIAGNOSIS — G4733 Obstructive sleep apnea (adult) (pediatric): Secondary | ICD-10-CM | POA: Diagnosis not present

## 2022-05-15 DIAGNOSIS — E119 Type 2 diabetes mellitus without complications: Secondary | ICD-10-CM | POA: Insufficient documentation

## 2022-05-15 DIAGNOSIS — Z6841 Body Mass Index (BMI) 40.0 and over, adult: Secondary | ICD-10-CM | POA: Diagnosis not present

## 2022-05-15 DIAGNOSIS — I10 Essential (primary) hypertension: Secondary | ICD-10-CM | POA: Diagnosis not present

## 2022-05-15 DIAGNOSIS — N926 Irregular menstruation, unspecified: Secondary | ICD-10-CM

## 2022-05-15 DIAGNOSIS — Z800 Family history of malignant neoplasm of digestive organs: Secondary | ICD-10-CM | POA: Diagnosis not present

## 2022-05-15 DIAGNOSIS — L80 Vitiligo: Secondary | ICD-10-CM | POA: Insufficient documentation

## 2022-05-15 DIAGNOSIS — A609 Anogenital herpesviral infection, unspecified: Secondary | ICD-10-CM | POA: Insufficient documentation

## 2022-05-15 DIAGNOSIS — Z7984 Long term (current) use of oral hypoglycemic drugs: Secondary | ICD-10-CM | POA: Diagnosis not present

## 2022-05-15 DIAGNOSIS — F32A Depression, unspecified: Secondary | ICD-10-CM | POA: Diagnosis not present

## 2022-05-15 DIAGNOSIS — K219 Gastro-esophageal reflux disease without esophagitis: Secondary | ICD-10-CM | POA: Diagnosis not present

## 2022-05-15 DIAGNOSIS — F40240 Claustrophobia: Secondary | ICD-10-CM | POA: Diagnosis not present

## 2022-05-15 DIAGNOSIS — F409 Phobic anxiety disorder, unspecified: Secondary | ICD-10-CM

## 2022-05-15 MED ORDER — LEVONORGESTREL 20 MCG/DAY IU IUD
1.0000 | INTRAUTERINE_SYSTEM | Freq: Once | INTRAUTERINE | 0 refills | Status: AC
  Filled 2022-05-15: qty 1, 30d supply, fill #0

## 2022-05-15 MED ORDER — MEDROXYPROGESTERONE ACETATE 10 MG PO TABS: 10.0000 mg | ORAL_TABLET | Freq: Every day | ORAL | 6 refills | Status: DC

## 2022-05-15 MED ORDER — LORAZEPAM 0.5 MG PO TABS: 0.5000 mg | ORAL_TABLET | Freq: Once | ORAL | 0 refills | Status: AC

## 2022-05-15 NOTE — Progress Notes (Unsigned)
GYNECOLOGIC ONCOLOGY NEW PATIENT CONSULTATION   Patient Name: Darlene [name]  Patient Age: 47 y.o. Date of Service: 05/15/22 Referring Provider: Dr. 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  We reviewed the nature of endometrial cancer and its recommended surgical staging, including total hysterectomy, bilateral salpingo-oophorectomy, and lymph node assessment. The patient is a suitable candidate for staging via a minimally invasive approach to surgery.  The patient understands that definitive surgical management is the standard of care.  We also discussed other treatment options in the setting of low-grade endometrial cancer including progesterone therapy and radiation.  I spent quite some time speaking with the patient about her fertility desires.  She is still very hopeful to have a child.  She voices good understanding of her medical comorbidities that put her at high risk if she were able to achieve pregnancy as well as the fact that her advanced age very likely means significant decrease in her ovarian function.  After discussion, I suggested getting an AMH to test her ovarian reserve.  If very low, she understands that this means that in the setting of IVF, she would be unlikely to have good response to stimulation.  In this case, she would be ready to move towards definitive surgical management with plan likely to keep her ovaries in situ.  We discussed that this would be a reasonable plan as long as the ovaries look normal at the time of surgery.  We also discussed the option of removal of the ovaries and consideration of hormone replacement therapy.  If AMH is not as low as I suspect it will be, we discussed fertility preserving therapy.  In this case, I would recommend getting an MRI to assess for myometrial invasion and metastatic  disease.  If no myometrial invasion or metastatic disease is noted on MRI, we discussed proceeding with progesterone therapy using an IUD.  In this case, I would recommend referral to REI to discuss fertility potential if regression is achieved with progesterone therapy.  We reviewed the role of progesterone therapy and the effect on preneoplastic and neoplastic lesions, believed to include induction of apoptosis in addition to tissue sloughing during withdrawal bleeding.  Activation of the progesterone receptors is believed to lead stromal decidualization and thinning of the lining.  We reviewed the 3 most studied options, to include levonorgesterol IUD, oral medorxyprogesterone acetate, or oral megesterol acetate.  She understands that all options have few side effects, most common being infrequent edema, GI disturbances, and thromboembolic events), but that local progesterone through IUD may have a stronger effect on the endometrium with less systemic side effects.     For monitoring, she understands that there is no recommend standard of care.  We will base her surveillance on GOG 224 protocol.  This will include EMB at 3 months following initiation of treatment.  EMBs will be performed at 3 to 6 month intervals, until a minimum of 3 negative biopsy results are obtained, after which sampling frequeny may then be yearly or until new abnormal uterine bleeding develops.  If persistence of endometrial cancer is noted by 6-9 months of treatment, then we will discuss additional agents or fitness/readiness for surgery.    We also reviewed the importance of weight loss in overall health as well as nonsurgical morbidity.  I think it would be reasonable to consider progesterone therapy if MRI is negative for  myometrial invasion in the setting of trying to achieve further weight loss prior to major surgery.  I have offered nutrition/exercise counseling today.  The patient is quite motivated to work on continued  weight loss.  She was congratulated on the weight loss she has been able to achieve thus far.  Patient will be scheduled for return visit in 1-2 weeks for Mirena IUD placement.  If AMH results would indicate no utility in attempting fertility preservation, we will discuss plan for surgery now versus progesterone therapy to allow for continued weight loss prior to definitive surgery.  A copy of this note was sent to the patient's referring provider.   85 minutes of total time was spent for this patient encounter, including preparation, face-to-face counseling with the patient and coordination of care, and documentation of the encounter.  Jeral Pinch, MD  Division of Gynecologic Oncology  Department of Obstetrics and Gynecology  [facility]  ___________________________________________  Chief Complaint: Chief Complaint  Patient presents with   Endometrial cancer [facility])    History of Present Illness:  Darlene [name] is a 47 y.o. y.o. female who is seen in consultation at the request of Dr. Carlynn Purl for an evaluation of endometrial cancer.  Patient was seen in September 2021 and at that time had a thickened endometrial lining noted on imaging.  She opted to undergo hysteroscopy with D&C rather than endometrial biopsy but did not get this scheduled.  She was started on Provera, which she took for 1 month to regulate her menses, but then discontinued the medication.  Repeat ultrasound in 11/2020 showed an endometrial lining of 9 mm.  Patient was seen again in October 2023 and noted menstrual cycle that had been ongoing for a month.  She has a history of abnormal cycles, will often skip multiple months at a time.  When she does have menses, she will bleed for a prolonged period.  Office pelvic ultrasound in October 2023 showed a uterus measuring 8.7 x 5.2 x 5.6 cm with a 2.1 cm thickened and heterogenous appearing endometrium, increased vascularity noted with  suspected polyps.  A stable 1.8 cm fibroid.  Ovaries noted to be normal in appearance.  Given appearance of polyps, recommendation was made for hysteroscopy with D&C.  D&C and polypectomy from 05/07/22 showed endometrioid carcinoma, grade 1. MMR itnact, p53 wildtype.  Last Hgb A1c was 7.2% in 03/2022. She is working on weight loss and has lost 20 pounds to date.  The patient reports normal menses after menarche and then developing heavy and long menstrual cycles.  She was started on OCPs for menstrual regulation.  She stopped these in her early 29s and had normal length menstrual cycle for a period of time.  Then her menses became irregular and quite heavy.  She describes more recent bleeding as cycles lasting up to 10 to 14 days.  She will bleed lightly for few days and then have very heavy bleeding with passage of golf ball size clots.  She can go up to 3 to 4 months between menstrual cycles.  Since her recent procedure, she has been feeling good.  She had some cramping immediately after but notes significant improvement in her pelvic discomfort and cramping.  She was starting to have bleeding right before the procedure and has continued to have some bleeding after, which she thinks may represent her menstrual cycle.  Bleeding is much lighter than a normal menses for her.  She endorses a good appetite although  decreased since starting Trulicity.  She takes Trulicity as well as metformin.  She has some baseline nausea.  She endorses normal bowel function although sometimes will skip days since starting Trulicity.  She was previously experiencing urinary incontinence.  She underwent pelvic floor physical therapy.  Since the surgery, she has noticed significant difference as if there has been pressure removed from her bladder.  She has some intermittent incontinence but frequently can make it to the bathroom now without any leakage.  She also notes that her history of lower back pain has improved since  surgery.  Patient comes in with her mother today.  She is not currently working.  PAST MEDICAL HISTORY:  Past Medical History:  Diagnosis Date   Arthritis    hands, low back   Depression    Environmental and seasonal allergies    Gallstones    05-05-2022  per pt no issues currently   Genital herpes    GERD (gastroesophageal reflux disease)    05-05-2022  per pt watches diet , not meds   Hyperlipidemia    Hypertension    Insomnia    Menorrhagia    Mixed incontinence urge and stress    Morbid obesity ([place])    OSA (obstructive sleep apnea) 2011   (05-05-2022  per pt tried  cpap but intolerent and did not follow up)  first dx 2011--- last sleep study done by dr dohmeier 01-17-2020 moderate to severe osa,  cpap on back order ,  finally received 04/ 2022   Type 2 diabetes mellitus ([place])    followed by pcp;   (05-05-2022  per pt check blood sugar 3-4 times daily,  fasting average blood sugar 108-115)   Uterine leiomyoma    Vitiligo    Wears contact lenses      PAST SURGICAL HISTORY:  Past Surgical History:  Procedure Laterality Date   COLONOSCOPY WITH PROPOFOL N/A 09/20/2020   Procedure: COLONOSCOPY WITH PROPOFOL;  Surgeon: Carol Ada, MD;  Location: [facility];  Service: Endoscopy;  Laterality: N/A;   DILATATION & CURETTAGE/HYSTEROSCOPY WITH MYOSURE N/A 05/07/2022   Procedure: DILATATION & CURETTAGE/HYSTEROSCOPY WITH MYOSURE;  Surgeon: Sherlyn Hay, DO;  Location: [place];  Service: Gynecology;  Laterality: N/A;   WISDOM TOOTH EXTRACTION      OB/GYN HISTORY:  OB History  Gravida Para Term Preterm AB Living  0 0 0 0 0 0  SAB IAB Ectopic Multiple Live Births  0 0 0 0 0    Patient's last menstrual period was 03/29/2022 (exact date).  Age at menarche: [age]  Age at menopause: n/a Hx of HRT: n/a Hx of STDs: yes Last pap: 2021- NIML, HR HPV negative History of abnormal pap smears: denies  SCREENING STUDIES:  Last mammogram: 2023  Last  colonoscopy: 2021  MEDICATIONS: Outpatient Encounter Medications as of 05/15/2022  Medication Sig   amLODipine (NORVASC) 5 MG tablet Take 1 tablet (5 mg total) by mouth in the morning. (Patient taking differently: Take 5 mg by mouth in the morning.)   blood glucose meter kit and supplies KIT One touch glucometer.   Use up to four times daily as directed. (FOR E11.9).   Dulaglutide (TRULICITY) 4.5 0000000 SOPN Inject 4.5 mg as directed once a week. (Patient taking differently: Inject 4.5 mg as directed once a week. Sunday's)   glimepiride (AMARYL) 2 MG tablet Take 1 tablet (2 mg total) by mouth in the morning. (Patient taking differently: Take 2 mg by mouth in the morning.)  levocetirizine (XYZAL) 5 MG tablet Take 1 tablet (5 mg total) by mouth daily as needed for allergies (coughing/drainage). (Patient taking differently: Take 5 mg by mouth at bedtime.)   losartan-hydrochlorothiazide (HYZAAR) 100-25 MG tablet Take 1 tablet by mouth in the morning. (Patient taking differently: Take 1 tablet by mouth at bedtime.)   medroxyPROGESTERone (PROVERA) 10 MG tablet Take 10 mg by mouth See admin instructions. Take 1 tablet (10 mg) by mouth for 10 days out of the month   Melatonin 10 MG TABS Take 10 mg by mouth at bedtime.   meloxicam (MOBIC) 15 MG tablet Take 1 tablet (15 mg total) by mouth in the morning.   metFORMIN (GLUCOPHAGE) 1000 MG tablet Take 1 tablet (1,000 mg total) by mouth in the morning and at bedtime. (Patient taking differently: Take 1,000 mg by mouth in the morning and at bedtime.)   Multiple Vitamin (MULTIVITAMIN) capsule Take 1 capsule by mouth daily.   PARoxetine (PAXIL) 30 MG tablet Take 1 tablet (30 mg total) by mouth in the morning. (Patient taking differently: Take 30 mg by mouth in the morning.)   rosuvastatin (CRESTOR) 10 MG tablet Take 1 tablet (10 mg total) by mouth in the morning. (Patient taking differently: Take 10 mg by mouth in the morning.)   spironolactone (ALDACTONE) 50 MG  tablet Take 1 tablet (50 mg total) by mouth in the morning.   nystatin ointment (MYCOSTATIN) Apply topically as needed. (Patient not taking: Reported on 05/14/2022)   [DISCONTINUED] ibuprofen (ADVIL) 600 MG tablet Take 1 tablet (600 mg total) by mouth every 6 (six) hours as needed for moderate pain or cramping.   [DISCONTINUED] oxyCODONE-acetaminophen (PERCOCET) 5-325 MG tablet Take 1 tablet by mouth every 4 (four) hours as needed for severe pain.   No facility-administered encounter medications on file as of 05/15/2022.    ALLERGIES:  Allergies  Allergen Reactions   Cat Hair Extract Other (See Comments)    Difficulty Breathing, Eye irritation   Yeast-Related Products Other (See Comments)    Per allergy test   Seasonal Ic [Octacosanol]     Pollen- Watery itchy eyes    Apple Rash   Latex Rash   Rice Rash   Tomato Rash and Other (See Comments)    Itchy throat (rash around mouth)   Wound Dressing Adhesive Rash     FAMILY HISTORY:  Family History  Problem Relation Age of Onset   Breast cancer Mother    Ovarian cysts Mother    Hypertension Mother    Diabetes Mother    Stroke Father    Diabetes Father    Cancer Father        bone marrow   Breast cancer Other    Colon cancer Neg Hx    Endometrial cancer Neg Hx    Pancreatic cancer Neg Hx    Prostate cancer Neg Hx      SOCIAL HISTORY:  Social Connections: Not on file    REVIEW OF SYSTEMS:  Denies appetite changes, fevers, chills, fatigue, unexplained weight changes. Denies hearing loss, neck lumps or masses, mouth sores, ringing in ears or voice changes. Denies cough or wheezing.  Denies shortness of breath. Denies chest pain or palpitations. Denies leg swelling. Denies abdominal distention, pain, blood in stools, constipation, diarrhea, nausea, vomiting, or early satiety. Denies pain with intercourse, dysuria, frequency, hematuria or incontinence. Denies hot flashes, pelvic pain, vaginal bleeding or vaginal discharge.    Denies joint pain, back pain or muscle pain/cramps. Denies itching, rash, or  wounds. Denies dizziness, headaches, numbness or seizures. Denies swollen lymph nodes or glands, denies easy bruising or bleeding. Denies anxiety, depression, confusion, or decreased concentration.  Physical Exam:  Vital Signs for this encounter:  Blood pressure (!) 145/88, pulse 77, temperature 99 F (37.2 C), temperature source Oral, resp. rate 16, height 5' 3.5" (1.613 m), weight (!) 306 lb 3.2 oz (138.9 kg), last menstrual period 03/29/2022, SpO2 99 %. Body mass index is 53.39 kg/m. General: Alert, oriented, no acute distress.  HEENT: Normocephalic, atraumatic. Sclera anicteric.  Chest: Unlabored breathing on room air.  LABORATORY AND RADIOLOGIC DATA:  Outside medical records were reviewed to synthesize the above history, along with the history and physical obtained during the visit.   Lab Results  Component Value Date   WBC 6.5 05/07/2022   HGB 13.3 05/07/2022   HCT 39.0 05/07/2022   PLT 241 05/07/2022   GLUCOSE 124 (H) 05/07/2022   CHOL 123 03/06/2022   TRIG 114.0 03/06/2022   HDL 42.70 03/06/2022   LDLDIRECT 148.6 02/13/2009   LDLCALC 58 03/06/2022   ALT 24 03/06/2022   AST 22 03/06/2022   NA 140 05/07/2022   K 3.2 (L) 05/07/2022   CL 100 05/07/2022   CREATININE 1.00 05/07/2022   BUN 11 05/07/2022   CO2 29 03/06/2022   TSH 1.33 02/13/2009   HGBA1C 7.2 (H) 03/06/2022   MICROALBUR 1.4 08/22/2021

## 2022-05-15 NOTE — Patient Instructions (Addendum)
It was very nice to meet you today.  Plan to have lab testing today to evaluate your ovarian function. You will be contacted with the results when this returns.  If testing reveals that there is not much ovarian function left, then we will discuss either replacing the IUD and getting an MRI of the pelvis with a goal of giving you more time to work on overall health and weight loss prior to hysterectomy or we can move forward with hysterectomy sooner.  Plan to take the ativan tablet 30-45 minutes before your MRI and have someone drive you to and from in case of drowsiness.

## 2022-05-18 ENCOUNTER — Inpatient Hospital Stay: Payer: 59 | Admitting: Licensed Clinical Social Worker

## 2022-05-18 NOTE — Progress Notes (Signed)
[place] Work  Initial Assessment   Darlene [name] is a 47 y.o. year old female contacted by phone. Clinical Social Work was referred by nurse for assessment of psychosocial needs.   SDOH (Social Determinants of Health) assessments performed: Yes SDOH Interventions    Flowsheet Row Clinical Support from 05/18/2022 in [place] [facility]  SDOH Interventions   Food Insecurity Interventions Other (Comment)  [SNAP & food pantry info]       SDOH Screenings   Food Insecurity: Food Insecurity Present (05/14/2022)  Housing: Low Risk  (05/14/2022)  Transportation Needs: No Transportation Needs (05/14/2022)  Utilities: Not At Risk (05/14/2022)  Depression (PHQ2-9): Low Risk  (05/14/2022)  Recent Concern: Depression (PHQ2-9) - Medium Risk (03/06/2022)  Tobacco Use: Low Risk  (05/15/2022)     Distress Screen completed: No     No data to display            Family/Social Information:  Housing Arrangement: patient lives with her mom Family members/support persons in your life? Good support from certain family (siblings, mom, cousin). Has not disclosed medical diagnosis to all family yet Transportation concerns: no  Employment: Was working for [employer], but they have had to pause operations since October. Currently no income.  Income source: mom's Radio broadcast assistant concerns: Yes, current concerns Type of concern: Animal nutritionist access concerns: yes, costs since not working Religious or spiritual practice: Not known Services Currently in place:    Coping/ Adjustment to diagnosis: Patient understands treatment plan and what happens next? yes, waiting on scans and fertility results. Understands process of figuring out next steps. That has been helpful for her with receiving the information from Dr. Berline Lopes Patient reported stressors: Finances Hopes and/or priorities: hopes to avoid hysterectomy as she hopes to have  kids Current coping skills/ strengths: Ability for insight , Communication skills , and Supportive family/friends     SUMMARY: Current SDOH Barriers:  Financial constraints related to work changes and income reduction  Clinical Social Work Clinical Goal(s):  Freight forwarder options for unmet needs related to:  Financial Strain   Interventions: Discussed common feeling and emotions when being diagnosed with cancer, and the importance of support during treatment Informed patient of the support team roles and support services at [facility] Provided [place] contact information and encouraged patient to call with any questions or concerns Provided patient with information about local financial and food resources. Encouraged pt to apply for assistance through DSS, especially SNAP benefits   Follow Up Plan: Patient will contact CSW with any support or resource needs Patient verbalizes understanding of plan: Yes    Mykle Pascua E Annisa Mazzarella, LCSW

## 2022-05-19 ENCOUNTER — Ambulatory Visit (HOSPITAL_COMMUNITY)
Admission: RE | Admit: 2022-05-19 | Discharge: 2022-05-19 | Disposition: A | Discharge status: Home / Self Care | Payer: 59 | Source: Ambulatory Visit | Attending: Gynecologic Oncology | Admitting: Gynecologic Oncology

## 2022-05-19 DIAGNOSIS — C541 Malignant neoplasm of endometrium: Secondary | ICD-10-CM | POA: Diagnosis not present

## 2022-05-19 LAB — ANTI MULLERIAN HORMONE: ANTI-MULLERIAN HORMONE (AMH): 0.037 ng/mL

## 2022-05-19 MED ORDER — GADOBUTROL 1 MMOL/ML IV SOLN
10.0000 mL | Freq: Once | INTRAVENOUS | Status: AC | PRN
  Administered 2022-05-19: 10 mL via INTRAVENOUS

## 2022-05-21 ENCOUNTER — Telehealth: Payer: Self-pay | Admitting: Gynecologic Oncology

## 2022-05-21 ENCOUNTER — Other Ambulatory Visit (HOSPITAL_COMMUNITY): Payer: Self-pay

## 2022-05-21 NOTE — Telephone Encounter (Signed)
Called patient. Discussed MRI findings - no definitive MI, no metastatic disease. Discussed low AMH. The patient would like to proceed with IUD placement tomorrow, working on weight loss and improving overall health. Will think about REI referral (although we discussed low AMH in terms of her ovarian reserve/function).  Jeral Pinch MD Gynecologic Oncology

## 2022-05-22 ENCOUNTER — Encounter: Payer: Self-pay | Admitting: Gynecologic Oncology

## 2022-05-22 ENCOUNTER — Inpatient Hospital Stay: Payer: 59

## 2022-05-22 ENCOUNTER — Other Ambulatory Visit: Payer: Self-pay

## 2022-05-22 ENCOUNTER — Inpatient Hospital Stay (HOSPITAL_BASED_OUTPATIENT_CLINIC_OR_DEPARTMENT_OTHER): Payer: 59 | Admitting: Gynecologic Oncology

## 2022-05-22 VITALS — BP 133/87 | HR 92 | Temp 99.3°F | Resp 16 | Ht 63.5 in | Wt 301.0 lb

## 2022-05-22 DIAGNOSIS — E119 Type 2 diabetes mellitus without complications: Secondary | ICD-10-CM | POA: Diagnosis not present

## 2022-05-22 DIAGNOSIS — C541 Malignant neoplasm of endometrium: Secondary | ICD-10-CM

## 2022-05-22 DIAGNOSIS — A609 Anogenital herpesviral infection, unspecified: Secondary | ICD-10-CM | POA: Diagnosis not present

## 2022-05-22 DIAGNOSIS — Z7985 Long-term (current) use of injectable non-insulin antidiabetic drugs: Secondary | ICD-10-CM | POA: Diagnosis not present

## 2022-05-22 DIAGNOSIS — Z6841 Body Mass Index (BMI) 40.0 and over, adult: Secondary | ICD-10-CM | POA: Diagnosis not present

## 2022-05-22 DIAGNOSIS — G4733 Obstructive sleep apnea (adult) (pediatric): Secondary | ICD-10-CM | POA: Diagnosis not present

## 2022-05-22 DIAGNOSIS — I10 Essential (primary) hypertension: Secondary | ICD-10-CM | POA: Diagnosis not present

## 2022-05-22 DIAGNOSIS — Z800 Family history of malignant neoplasm of digestive organs: Secondary | ICD-10-CM | POA: Diagnosis not present

## 2022-05-22 DIAGNOSIS — Z30430 Encounter for insertion of intrauterine contraceptive device: Secondary | ICD-10-CM | POA: Diagnosis not present

## 2022-05-22 DIAGNOSIS — Z7984 Long term (current) use of oral hypoglycemic drugs: Secondary | ICD-10-CM | POA: Diagnosis not present

## 2022-05-22 DIAGNOSIS — Z79899 Other long term (current) drug therapy: Secondary | ICD-10-CM | POA: Diagnosis not present

## 2022-05-22 DIAGNOSIS — F32A Depression, unspecified: Secondary | ICD-10-CM | POA: Diagnosis not present

## 2022-05-22 DIAGNOSIS — K219 Gastro-esophageal reflux disease without esophagitis: Secondary | ICD-10-CM | POA: Diagnosis not present

## 2022-05-22 DIAGNOSIS — Z803 Family history of malignant neoplasm of breast: Secondary | ICD-10-CM | POA: Diagnosis not present

## 2022-05-22 DIAGNOSIS — L80 Vitiligo: Secondary | ICD-10-CM | POA: Diagnosis not present

## 2022-05-22 DIAGNOSIS — M1990 Unspecified osteoarthritis, unspecified site: Secondary | ICD-10-CM | POA: Diagnosis not present

## 2022-05-22 DIAGNOSIS — E785 Hyperlipidemia, unspecified: Secondary | ICD-10-CM | POA: Diagnosis not present

## 2022-05-22 DIAGNOSIS — Z3202 Encounter for pregnancy test, result negative: Secondary | ICD-10-CM | POA: Diagnosis not present

## 2022-05-22 LAB — PREGNANCY, URINE: Preg Test, Ur: NEGATIVE

## 2022-05-22 NOTE — Progress Notes (Signed)
Gynecologic Oncology Return Clinic Visit  05/22/22  Reason for Visit: Mirena IUD placement  Treatment History: Patient was seen in September 2021 and at that time had a thickened endometrial lining noted on imaging.  She opted to undergo hysteroscopy with D&C rather than endometrial biopsy but did not get this scheduled.  She was started on Provera, which she took for 1 month to regulate her menses, but then discontinued the medication.  Repeat ultrasound in 11/2020 showed an endometrial lining of 9 mm.  Patient was seen again in October 2023 and noted menstrual cycle that had been ongoing for a month.  She has a history of abnormal cycles, will often skip multiple months at a time.  When she does have menses, she will bleed for a prolonged period.  Office pelvic ultrasound in October 2023 showed a uterus measuring 8.7 x 5.2 x 5.6 cm with a 2.1 cm thickened and heterogenous appearing endometrium, increased vascularity noted with suspected polyps.  A stable 1.8 cm fibroid.  Ovaries noted to be normal in appearance.  Given appearance of polyps, recommendation was made for hysteroscopy with D&C.   D&C and polypectomy from 05/07/22 showed endometrioid carcinoma, grade 1. MMR itnact, p53 wildtype.  AMH on 3/15: 0.037  MRI pelvis 05/19/22: 7 mm small, focal nodular appearing endometrial lesion in the lower uterine segment.  No evidence of myometrial invasion.  Normal ovaries. No adenopathy or other evidence of metastatic disease.  Interval History: Doing well, no significant changes since her last visit with me.  Had some cramping within the last week.  Past Medical/Surgical History: Past Medical History:  Diagnosis Date   Arthritis    hands, low back   Depression    Environmental and seasonal allergies    Gallstones    05-05-2022  per pt no issues currently   Genital herpes    GERD (gastroesophageal reflux disease)    05-05-2022  per pt watches diet , not meds   Hyperlipidemia    Hypertension     Insomnia    Menorrhagia    Mixed incontinence urge and stress    Morbid obesity ([place])    OSA (obstructive sleep apnea) 2011   (05-05-2022  per pt tried  cpap but intolerent and did not follow up)  first dx 2011--- last sleep study done by dr dohmeier 01-17-2020 moderate to severe osa,  cpap on back order ,  finally received 04/ 2022   Type 2 diabetes mellitus ([place])    followed by pcp;   (05-05-2022  per pt check blood sugar 3-4 times daily,  fasting average blood sugar 108-115)   Uterine leiomyoma    Vitiligo    Wears contact lenses     Past Surgical History:  Procedure Laterality Date   COLONOSCOPY WITH PROPOFOL N/A 09/20/2020   Procedure: COLONOSCOPY WITH PROPOFOL;  Surgeon: Carol Ada, MD;  Location: [facility];  Service: Endoscopy;  Laterality: N/A;   DILATATION & CURETTAGE/HYSTEROSCOPY WITH MYOSURE N/A 05/07/2022   Procedure: DILATATION & CURETTAGE/HYSTEROSCOPY WITH MYOSURE;  Surgeon: Sherlyn Hay, DO;  Location: [facility];  Service: Gynecology;  Laterality: N/A;   WISDOM TOOTH EXTRACTION      Family History  Problem Relation Age of Onset   Breast cancer Mother    Ovarian cysts Mother    Hypertension Mother    Diabetes Mother    Stroke Father    Diabetes Father    Cancer Father        bone marrow  Breast cancer Other    Colon cancer Neg Hx    Endometrial cancer Neg Hx    Pancreatic cancer Neg Hx    Prostate cancer Neg Hx     Social History   Socioeconomic History   Marital status: Single    Spouse name: Not on file   Number of children: 0   Years of education: some college   Highest education level: Not on file  Occupational History   Occupation: customer service  Tobacco Use   Smoking status: Never   Smokeless tobacco: Never  Vaping Use   Vaping Use: Former   Substances: THC  Substance and Sexual Activity   Alcohol use: Not Currently    Comment: rare   Drug use: Not Currently    Types: Marijuana   Sexual activity: Yes     Birth control/protection: None  Other Topics Concern   Not on file  Social History Narrative   Lives with her mother.   Right-handed.   Caffeine use: 1-2 cups per day.   Social Determinants of Health   Financial Resource Strain: Not on file  Food Insecurity: Food Insecurity Present (05/14/2022)   Hunger Vital Sign    Worried About Running Out of Food in the Last Year: Sometimes true    Ran Out of Food in the Last Year: Sometimes true  Transportation Needs: No Transportation Needs (05/14/2022)   PRAPARE - Hydrologist (Medical): No    Lack of Transportation (Non-Medical): No  Physical Activity: Not on file  Stress: Not on file  Social Connections: Not on file    Current Medications:  Current Outpatient Medications:    amLODipine (NORVASC) 5 MG tablet, Take 1 tablet (5 mg total) by mouth in the morning., Disp: 90 tablet, Rfl: 3   blood glucose meter kit and supplies KIT, One touch glucometer.   Use up to four times daily as directed. (FOR E11.9)., Disp: 1 each, Rfl: 0   Dulaglutide (TRULICITY) 4.5 0000000 SOPN, Inject 4.5 mg as directed once a week., Disp: 6 mL, Rfl: 1   glimepiride (AMARYL) 2 MG tablet, Take 1 tablet (2 mg total) by mouth in the morning., Disp: 90 tablet, Rfl: 3   levocetirizine (XYZAL) 5 MG tablet, Take 1 tablet (5 mg total) by mouth daily as needed for allergies (coughing/drainage)., Disp: 90 tablet, Rfl: 3   levonorgestrel (MIRENA) 20 MCG/DAY IUD, To be placed in the [facility] office, Disp: 1 each, Rfl: 0   losartan-hydrochlorothiazide (HYZAAR) 100-25 MG tablet, Take 1 tablet by mouth in the morning., Disp: 90 tablet, Rfl: 3   medroxyPROGESTERone (PROVERA) 10 MG tablet, Take 1 tablet (10 mg total) by mouth daily., Disp: 30 tablet, Rfl: 6   Melatonin 10 MG TABS, Take 10 mg by mouth at bedtime., Disp: 30 tablet, Rfl: 11   meloxicam (MOBIC) 15 MG tablet, Take 1 tablet (15 mg total) by mouth in the morning., Disp: 90 tablet, Rfl: 3    metFORMIN (GLUCOPHAGE) 1000 MG tablet, Take 1 tablet (1,000 mg total) by mouth in the morning and at bedtime., Disp: 90 tablet, Rfl: 3   Multiple Vitamin (MULTIVITAMIN) capsule, Take 1 capsule by mouth daily., Disp: , Rfl:    PARoxetine (PAXIL) 30 MG tablet, Take 1 tablet (30 mg total) by mouth in the morning., Disp: 90 tablet, Rfl: 3   rosuvastatin (CRESTOR) 10 MG tablet, Take 1 tablet (10 mg total) by mouth in the morning., Disp: 90 tablet, Rfl: 3  spironolactone (ALDACTONE) 50 MG tablet, Take 1 tablet (50 mg total) by mouth in the morning., Disp: 90 tablet, Rfl: 3  Review of Systems: Denies appetite changes, fevers, chills, fatigue, unexplained weight changes. Denies hearing loss, neck lumps or masses, mouth sores, ringing in ears or voice changes. Denies cough or wheezing.  Denies shortness of breath. Denies chest pain or palpitations. Denies leg swelling. Denies abdominal distention, pain, blood in stools, constipation, diarrhea, nausea, vomiting, or early satiety. Denies pain with intercourse, dysuria, frequency, hematuria or incontinence. Denies hot flashes, pelvic pain, vaginal bleeding or vaginal discharge.   Denies joint pain, back pain or muscle pain/cramps. Denies itching, rash, or wounds. Denies dizziness, headaches, numbness or seizures. Denies swollen lymph nodes or glands, denies easy bruising or bleeding. Denies anxiety, depression, confusion, or decreased concentration.  Physical Exam: BP 133/87 (BP Location: Left Arm, Patient Position: Sitting)   Pulse 92   Temp 99.3 F (37.4 C) (Oral)   Resp 16   Ht 5' 3.5" (1.613 m)   Wt (!) 301 lb (136.5 kg)   LMP 03/29/2022 (Exact Date)   SpO2 99%   BMI 52.48 kg/m  General: Alert, oriented, no acute distress. HEENT: Normocephalic, atraumatic, sclera anicteric. Chest: Clear to auscultation bilaterally.  No wheezes or rhonchi. Cardiovascular: Regular rate and rhythm, no murmurs. Abdomen: Obese, soft, nontender.  Normoactive  bowel sounds.  No masses or hepatosplenomegaly appreciated.   Extremities: Grossly normal range of motion.  Warm, well perfused.  No edema bilaterally. Skin: No rashes or lesions noted. Lymphatics: No cervical, supraclavicular, or inguinal adenopathy. GU: Normal appearing external genitalia without erythema, excoriation, or lesions.  Speculum exam reveals well rugated vaginal mucosa.  Mixes nulliparous, normal in appearance.  Mirena IUD insertion Preoperative diagnosis: Grade 1 endometrial cancer Postoperative diagnosis: Same as above Physician: Berline Lopes MD Estimated blood loss: Minimal Specimens: n/a Procedure: After the procedure was discussed with the patient including risks and benefits, she gave verbal consent.  She was then placed in dorsolithotomy position and a speculum was placed in the vagina.  Once the cervix was well visualized it was cleansed with Betadine x3.  An endometrial Pipelle was then passed to a depth of 8 cm to sound the uterus.  Mirena IUD, Lot [id number] with exp date 05/2024 was then inserted to the uterine fundus.  The IUD was deployed and the inserter removed.  Strings were cut at 3-4 cm.  Overall the patient tolerated the procedure well.  All instruments were removed from the vagina.  Laboratory & Radiologic Studies: See above  Assessment & Plan: Darlene [name] is a 47 y.o. woman with clinical stage I low grade endometrial cancer who presents for IUD insertion.  Patient is doing very well.  Mirena IUD placed today without difficulty after pregnancy test came back negative.  It was congratulated on her continued weight loss, another 5 pounds since her visit last week.  She continues to be very motivated regarding healthy lifestyle changes and diet.  Discussed with her REI clinic options in the area.  She was given a list of clinics in [place], [place], and [place].  We have previously discussed her AMH which is quite low and my concerns regarding  fertility for her.  I have encouraged her to pursue any consultation with REI over the next 3 months.  Otherwise, I will plan to see her back in 3 months for a visit and endometrial biopsy.  24 minutes of total time was spent for this patient encounter, including preparation,  face-to-face counseling with the patient and coordination of care, and documentation of the encounter.  Jeral Pinch, MD  Division of Gynecologic Oncology  Department of Obstetrics and Gynecology  [facility]

## 2022-05-22 NOTE — Patient Instructions (Signed)
It was good to see you today.  Darlene [name] has printed out a list of REI providers in the area.  Some of them may want a referral from [place].  Please contact Darlene [name] if/when you decide who you would like to see.  Otherwise, we will plan for a follow-up visit with me in 3 months to check-in and perform another endometrial biopsy.  Congratulations on your continued weight loss!

## 2022-06-02 ENCOUNTER — Other Ambulatory Visit: Payer: Self-pay | Admitting: Internal Medicine

## 2022-06-04 ENCOUNTER — Encounter: Payer: Self-pay | Admitting: Internal Medicine

## 2022-06-04 DIAGNOSIS — C541 Malignant neoplasm of endometrium: Secondary | ICD-10-CM | POA: Insufficient documentation

## 2022-06-04 NOTE — Progress Notes (Signed)
Subjective:    Patient ID: Darlene [name], female    DOB: [birth year], 47 y.o.   MRN: [id number]     HPI Darlene [name] is here for follow up of her chronic medical problems.   ? Bmp, A1c or just ck a1c  Medications and allergies reviewed with patient and updated if appropriate.  Current Outpatient Medications on File Prior to Visit  Medication Sig Dispense Refill  . amLODipine (NORVASC) 5 MG tablet Take 1 tablet (5 mg total) by mouth in the morning. 90 tablet 3  . blood glucose meter kit and supplies KIT One touch glucometer.   Use up to four times daily as directed. (FOR E11.9). 1 each 0  . Dulaglutide (TRULICITY) 4.5 MG/0.5ML SOPN Inject 4.5 mg as directed once a week. 6 mL 1  . glimepiride (AMARYL) 2 MG tablet Take 1 tablet (2 mg total) by mouth in the morning. 90 tablet 3  . Lancets (ONETOUCH DELICA PLUS LANCET33G) MISC USE TO TEST BLOOD SUGAR 4 TIMES DAILY 100 each 0  . levocetirizine (XYZAL) 5 MG tablet Take 1 tablet (5 mg total) by mouth daily as needed for allergies (coughing/drainage). 90 tablet 3  . levonorgestrel (MIRENA) 20 MCG/DAY IUD To be placed in the GYN ONC office 1 each 0  . losartan-hydrochlorothiazide (HYZAAR) 100-25 MG tablet Take 1 tablet by mouth in the morning. 90 tablet 3  . medroxyPROGESTERone (PROVERA) 10 MG tablet Take 1 tablet (10 mg total) by mouth daily. 30 tablet 6  . Melatonin 10 MG TABS Take 10 mg by mouth at bedtime. 30 tablet 11  . meloxicam (MOBIC) 15 MG tablet Take 1 tablet (15 mg total) by mouth in the morning. 90 tablet 3  . metFORMIN (GLUCOPHAGE) 1000 MG tablet Take 1 tablet (1,000 mg total) by mouth in the morning and at bedtime. 90 tablet 3  . Multiple Vitamin (MULTIVITAMIN) capsule Take 1 capsule by mouth daily.    Marland Kitchen PARoxetine (PAXIL) 30 MG tablet Take 1 tablet (30 mg total) by mouth in the morning. 90 tablet 3  . rosuvastatin (CRESTOR) 10 MG tablet Take 1 tablet (10 mg total) by mouth in the morning. 90 tablet 3  . spironolactone  (ALDACTONE) 50 MG tablet Take 1 tablet (50 mg total) by mouth in the morning. 90 tablet 3   No current facility-administered medications on file prior to visit.     Review of Systems     Objective:  There were no vitals filed for this visit. BP Readings from Last 3 Encounters:  06/17/22 134/70  06/09/22 130/78  05/22/22 133/87   Wt Readings from Last 3 Encounters:  06/17/22 293 lb (132.9 kg)  06/09/22 298 lb (135.2 kg)  05/22/22 (!) 301 lb (136.5 kg)   There is no height or weight on file to calculate BMI.    Physical Exam     Lab Results  Component Value Date   WBC 6.5 05/07/2022   HGB 13.3 05/07/2022   HCT 39.0 05/07/2022   PLT 241 05/07/2022   GLUCOSE 126 (H) 06/09/2022   CHOL 123 03/06/2022   TRIG 114.0 03/06/2022   HDL 42.70 03/06/2022   LDLDIRECT 148.6 02/13/2009   LDLCALC 58 03/06/2022   ALT 24 03/06/2022   AST 22 03/06/2022   NA 139 06/09/2022   K 3.7 06/09/2022   CL 102 06/09/2022   CREATININE 0.96 06/09/2022   BUN 8 06/09/2022   CO2 28 06/09/2022   TSH 1.33 02/13/2009   HGBA1C  6.6 (H) 06/09/2022   MICROALBUR 1.4 08/22/2021     Assessment & Plan:    See Problem List for Assessment and Plan of chronic medical problems.    This encounter was created in error - please disregard.

## 2022-06-04 NOTE — Patient Instructions (Addendum)
      Blood work was ordered.   The lab is on the first floor.    Medications changes include :       A referral was ordered for XXX.     Someone will call you to schedule an appointment.    Return in about 6 months (around 12/05/2022) for follow up.

## 2022-06-05 ENCOUNTER — Ambulatory Visit: Payer: Commercial Managed Care - HMO | Admitting: Internal Medicine

## 2022-06-05 ENCOUNTER — Encounter: Payer: Commercial Managed Care - HMO | Admitting: Internal Medicine

## 2022-06-05 DIAGNOSIS — E119 Type 2 diabetes mellitus without complications: Secondary | ICD-10-CM

## 2022-06-05 DIAGNOSIS — I10 Essential (primary) hypertension: Secondary | ICD-10-CM

## 2022-06-05 DIAGNOSIS — F3289 Other specified depressive episodes: Secondary | ICD-10-CM

## 2022-06-05 DIAGNOSIS — C541 Malignant neoplasm of endometrium: Secondary | ICD-10-CM

## 2022-06-05 DIAGNOSIS — E782 Mixed hyperlipidemia: Secondary | ICD-10-CM

## 2022-06-05 NOTE — Assessment & Plan Note (Signed)
Chronic   Lab Results  Component Value Date   HGBA1C 7.2 (H) 03/06/2022   Sugars not controlled at her last visit Check A1c Continue Trulicity 4.5 mg weekly, glimepiride 2 mg daily, metformin 1000 mg twice daily Stressed weight loss Stressed regular exercise Stressed diabetic diet

## 2022-06-05 NOTE — Assessment & Plan Note (Signed)
Chronic Blood pressure well controlled BMP Continue amlodipine 5 mg daily, losartan-HCTZ 100-25 mg daily, spironolactone 50 mg daily

## 2022-06-05 NOTE — Assessment & Plan Note (Signed)
Chronic Controlled, Stable Continue Paxil 30 mg daily 

## 2022-06-05 NOTE — Assessment & Plan Note (Signed)
Chronic Regular exercise and healthy diet encouraged Check lipid panel  Continue Crestor 10 mg daily 

## 2022-06-05 NOTE — Assessment & Plan Note (Signed)
New diagnosis since she was here last Had a biopsy Considering egg retrieval and then will likely need a hysterectomy

## 2022-06-08 ENCOUNTER — Encounter: Payer: Self-pay | Admitting: Internal Medicine

## 2022-06-08 NOTE — Progress Notes (Unsigned)
Subjective:    Patient ID: Darlene [name], female    DOB: 04-11-1975, 47 y.o.   MRN: [id number]     HPI Darlene [name] is here for follow up of her chronic medical problems.  Has been bleeding since the surgery - spotting mostly.   Will likely go back to work by the end of April.   She is not exercising regularly.   Eating once a day.  Medications and allergies reviewed with patient and updated if appropriate.  Current Outpatient Medications on File Prior to Visit  Medication Sig Dispense Refill   amLODipine (NORVASC) 5 MG tablet Take 1 tablet (5 mg total) by mouth in the morning. 90 tablet 3   blood glucose meter kit and supplies KIT One touch glucometer.   Use up to four times daily as directed. (FOR E11.9). 1 each 0   Dulaglutide (TRULICITY) 4.5 MG/0.5ML SOPN Inject 4.5 mg as directed once a week. 6 mL 1   glimepiride (AMARYL) 2 MG tablet Take 1 tablet (2 mg total) by mouth in the morning. 90 tablet 3   Lancets (ONETOUCH DELICA PLUS LANCET33G) MISC USE TO TEST BLOOD SUGAR 4 TIMES DAILY 100 each 0   levocetirizine (XYZAL) 5 MG tablet Take 1 tablet (5 mg total) by mouth daily as needed for allergies (coughing/drainage). 90 tablet 3   losartan-hydrochlorothiazide (HYZAAR) 100-25 MG tablet Take 1 tablet by mouth in the morning. 90 tablet 3   medroxyPROGESTERone (PROVERA) 10 MG tablet Take 1 tablet (10 mg total) by mouth daily. 30 tablet 6   Melatonin 10 MG TABS Take 10 mg by mouth at bedtime. 30 tablet 11   meloxicam (MOBIC) 15 MG tablet Take 1 tablet (15 mg total) by mouth in the morning. 90 tablet 3   metFORMIN (GLUCOPHAGE) 1000 MG tablet Take 1 tablet (1,000 mg total) by mouth in the morning and at bedtime. 90 tablet 3   Multiple Vitamin (MULTIVITAMIN) capsule Take 1 capsule by mouth daily.     ONETOUCH ULTRA test strip USE TO TEST BLOOD SUGAR 4 TIMES DAILY 100 strip 0   PARoxetine (PAXIL) 30 MG tablet Take 1 tablet (30 mg total) by mouth in the morning. 90 tablet 3   rosuvastatin  (CRESTOR) 10 MG tablet Take 1 tablet (10 mg total) by mouth in the morning. 90 tablet 3   spironolactone (ALDACTONE) 50 MG tablet Take 1 tablet (50 mg total) by mouth in the morning. 90 tablet 3   levonorgestrel (MIRENA) 20 MCG/DAY IUD To be placed in the GYN ONC office 1 each 0   No current facility-administered medications on file prior to visit.     Review of Systems  Constitutional:  Negative for fever.  Respiratory:  Negative for cough, shortness of breath and wheezing.   Cardiovascular:  Negative for chest pain, palpitations and leg swelling.  Neurological:  Positive for light-headedness (when she bends over). Negative for headaches.       Objective:   Vitals:   06/09/22 1518  BP: 130/78  Pulse: (!) 105  Temp: 99.3 F (37.4 C)  SpO2: 98%   BP Readings from Last 3 Encounters:  06/09/22 130/78  05/22/22 133/87  05/15/22 (!) 145/88   Wt Readings from Last 3 Encounters:  06/09/22 298 lb (135.2 kg)  05/22/22 (!) 301 lb (136.5 kg)  05/15/22 (!) 306 lb 3.2 oz (138.9 kg)   Body mass index is 51.96 kg/m.    Physical Exam Constitutional:  General: She is not in acute distress.    Appearance: Normal appearance.  HENT:     Head: Normocephalic and atraumatic.  Eyes:     Conjunctiva/sclera: Conjunctivae normal.  Cardiovascular:     Rate and Rhythm: Normal rate and regular rhythm.     Heart sounds: Normal heart sounds.  Pulmonary:     Effort: Pulmonary effort is normal. No respiratory distress.     Breath sounds: Normal breath sounds. No wheezing.  Musculoskeletal:     Cervical back: Neck supple.     Right lower leg: No edema.     Left lower leg: No edema.  Lymphadenopathy:     Cervical: No cervical adenopathy.  Skin:    General: Skin is warm and dry.     Findings: No rash.  Neurological:     Mental Status: She is alert. Mental status is at baseline.  Psychiatric:        Mood and Affect: Mood normal.        Behavior: Behavior normal.        Lab  Results  Component Value Date   WBC 6.5 05/07/2022   HGB 13.3 05/07/2022   HCT 39.0 05/07/2022   PLT 241 05/07/2022   GLUCOSE 124 (H) 05/07/2022   CHOL 123 03/06/2022   TRIG 114.0 03/06/2022   HDL 42.70 03/06/2022   LDLDIRECT 148.6 02/13/2009   LDLCALC 58 03/06/2022   ALT 24 03/06/2022   AST 22 03/06/2022   NA 140 05/07/2022   K 3.2 (L) 05/07/2022   CL 100 05/07/2022   CREATININE 1.00 05/07/2022   BUN 11 05/07/2022   CO2 29 03/06/2022   TSH 1.33 02/13/2009   HGBA1C 7.2 (H) 03/06/2022   MICROALBUR 1.4 08/22/2021     Assessment & Plan:    See Problem List for Assessment and Plan of chronic medical problems.

## 2022-06-08 NOTE — Patient Instructions (Signed)
      Blood work was ordered.   The lab is on the first floor.    Medications changes include :   none      Return in about 3 months (around 09/08/2022) for follow up.

## 2022-06-09 ENCOUNTER — Encounter: Payer: Self-pay | Admitting: Internal Medicine

## 2022-06-09 ENCOUNTER — Ambulatory Visit (INDEPENDENT_AMBULATORY_CARE_PROVIDER_SITE_OTHER): Payer: 59 | Admitting: Internal Medicine

## 2022-06-09 VITALS — BP 130/78 | HR 105 | Temp 99.3°F | Ht 63.5 in | Wt 298.0 lb

## 2022-06-09 DIAGNOSIS — Z1211 Encounter for screening for malignant neoplasm of colon: Secondary | ICD-10-CM | POA: Insufficient documentation

## 2022-06-09 DIAGNOSIS — F3289 Other specified depressive episodes: Secondary | ICD-10-CM | POA: Diagnosis not present

## 2022-06-09 DIAGNOSIS — C541 Malignant neoplasm of endometrium: Secondary | ICD-10-CM

## 2022-06-09 DIAGNOSIS — E1165 Type 2 diabetes mellitus with hyperglycemia: Secondary | ICD-10-CM | POA: Insufficient documentation

## 2022-06-09 DIAGNOSIS — I10 Essential (primary) hypertension: Secondary | ICD-10-CM

## 2022-06-09 DIAGNOSIS — E782 Mixed hyperlipidemia: Secondary | ICD-10-CM

## 2022-06-09 DIAGNOSIS — E119 Type 2 diabetes mellitus without complications: Secondary | ICD-10-CM

## 2022-06-09 LAB — BASIC METABOLIC PANEL
BUN: 8 mg/dL (ref 6–23)
CO2: 28 mEq/L (ref 19–32)
Calcium: 10.1 mg/dL (ref 8.4–10.5)
Chloride: 102 mEq/L (ref 96–112)
Creatinine, Ser: 0.96 mg/dL (ref 0.40–1.20)
GFR: 70.29 mL/min (ref >60.00)
Glucose, Bld: 126 mg/dL — ABNORMAL HIGH (ref 70–99)
Potassium: 3.7 mEq/L (ref 3.5–5.1)
Sodium: 139 mEq/L (ref 135–145)

## 2022-06-09 LAB — HEMOGLOBIN A1C: Hgb A1c MFr Bld: 6.6 % — ABNORMAL HIGH (ref 4.6–6.5)

## 2022-06-09 NOTE — Assessment & Plan Note (Signed)
Chronic Stable Continue paxil 30 mg daily

## 2022-06-09 NOTE — Assessment & Plan Note (Addendum)
New diagnosis since she was here last Had a biopsy Had mirena placed and will have bx 3 months to monitor - may not need hysterectomy Has follow up with Dr Pricilla Holm

## 2022-06-09 NOTE — Assessment & Plan Note (Addendum)
Chronic   Lab Results  Component Value Date   HGBA1C 7.2 (H) 03/06/2022   Sugars not controlled at her last visit Check A1c Continue Trulicity 4.5 mg weekly, glimepiride 2 mg daily, metformin 1000 mg twice daily - will decrease medication if able Stressed weight loss Stressed regular exercise Stressed diabetic diet

## 2022-06-09 NOTE — Assessment & Plan Note (Signed)
Chronic Regular exercise and healthy diet encouraged Check lipid panel  Continue Crestor 10 mg daily 

## 2022-06-09 NOTE — Assessment & Plan Note (Signed)
Chronic Blood pressure well controlled BMP Continue amlodipine 5 mg daily, losartan-HCTZ 100-25 mg daily, spironolactone 50 mg daily

## 2022-06-15 ENCOUNTER — Encounter: Payer: Self-pay | Admitting: Internal Medicine

## 2022-06-16 NOTE — Progress Notes (Unsigned)
    Subjective:    Patient ID: Darlene [name], female    DOB: [birth year], 47 y.o.   MRN: [id number]      HPI Chyanna is here for No chief complaint on file.        Medications and allergies reviewed with patient and updated if appropriate.  Current Outpatient Medications on File Prior to Visit  Medication Sig Dispense Refill   amLODipine (NORVASC) 5 MG tablet Take 1 tablet (5 mg total) by mouth in the morning. 90 tablet 3   blood glucose meter kit and supplies KIT One touch glucometer.   Use up to four times daily as directed. (FOR E11.9). 1 each 0   Dulaglutide (TRULICITY) 4.5 MG/0.5ML SOPN Inject 4.5 mg as directed once a week. 6 mL 1   glimepiride (AMARYL) 2 MG tablet Take 1 tablet (2 mg total) by mouth in the morning. 90 tablet 3   Lancets (ONETOUCH DELICA PLUS LANCET33G) MISC USE TO TEST BLOOD SUGAR 4 TIMES DAILY 100 each 0   levocetirizine (XYZAL) 5 MG tablet Take 1 tablet (5 mg total) by mouth daily as needed for allergies (coughing/drainage). 90 tablet 3   levonorgestrel (MIRENA) 20 MCG/DAY IUD To be placed in the GYN ONC office 1 each 0   losartan-hydrochlorothiazide (HYZAAR) 100-25 MG tablet Take 1 tablet by mouth in the morning. 90 tablet 3   medroxyPROGESTERone (PROVERA) 10 MG tablet Take 1 tablet (10 mg total) by mouth daily. 30 tablet 6   Melatonin 10 MG TABS Take 10 mg by mouth at bedtime. 30 tablet 11   meloxicam (MOBIC) 15 MG tablet Take 1 tablet (15 mg total) by mouth in the morning. 90 tablet 3   metFORMIN (GLUCOPHAGE) 1000 MG tablet Take 1 tablet (1,000 mg total) by mouth in the morning and at bedtime. 90 tablet 3   Multiple Vitamin (MULTIVITAMIN) capsule Take 1 capsule by mouth daily.     ONETOUCH ULTRA test strip USE TO TEST BLOOD SUGAR 4 TIMES DAILY 100 strip 0   PARoxetine (PAXIL) 30 MG tablet Take 1 tablet (30 mg total) by mouth in the morning. 90 tablet 3   rosuvastatin (CRESTOR) 10 MG tablet Take 1 tablet (10 mg total) by mouth in the morning. 90 tablet 3    spironolactone (ALDACTONE) 50 MG tablet Take 1 tablet (50 mg total) by mouth in the morning. 90 tablet 3   No current facility-administered medications on file prior to visit.    Review of Systems     Objective:  There were no vitals filed for this visit. BP Readings from Last 3 Encounters:  06/09/22 130/78  05/22/22 133/87  05/15/22 (!) 145/88   Wt Readings from Last 3 Encounters:  06/09/22 298 lb (135.2 kg)  05/22/22 (!) 301 lb (136.5 kg)  05/15/22 (!) 306 lb 3.2 oz (138.9 kg)   There is no height or weight on file to calculate BMI.    Physical Exam         Assessment & Plan:    See Problem List for Assessment and Plan of chronic medical problems.

## 2022-06-17 ENCOUNTER — Encounter: Payer: Self-pay | Admitting: Internal Medicine

## 2022-06-17 ENCOUNTER — Ambulatory Visit (INDEPENDENT_AMBULATORY_CARE_PROVIDER_SITE_OTHER): Payer: 59 | Admitting: Internal Medicine

## 2022-06-17 VITALS — BP 134/70 | HR 88 | Temp 98.3°F | Ht 63.5 in | Wt 293.0 lb

## 2022-06-17 DIAGNOSIS — J069 Acute upper respiratory infection, unspecified: Secondary | ICD-10-CM

## 2022-06-17 DIAGNOSIS — B379 Candidiasis, unspecified: Secondary | ICD-10-CM | POA: Diagnosis not present

## 2022-06-17 DIAGNOSIS — H6993 Unspecified Eustachian tube disorder, bilateral: Secondary | ICD-10-CM | POA: Diagnosis not present

## 2022-06-17 DIAGNOSIS — I10 Essential (primary) hypertension: Secondary | ICD-10-CM | POA: Diagnosis not present

## 2022-06-17 MED ORDER — FLUTICASONE PROPIONATE 50 MCG/ACT NA SUSP: 2.0000 | Freq: Every day | NASAL | 6 refills | Status: AC

## 2022-06-17 NOTE — Assessment & Plan Note (Signed)
Acute Symptoms likely viral in nature Continue symptomatic treatment with over-the-counter cold medications, Tylenol/ibuprofen Increase rest and fluids Call if symptoms worsen or do not improve  

## 2022-06-17 NOTE — Assessment & Plan Note (Signed)
Acute Related to viral URI or allergies Continue xyzal Start flonase 2 sprays each nostril daily If not effective can try sudafed for a couple of days since BP is ok Will try to avoid prednisone given DM

## 2022-06-17 NOTE — Assessment & Plan Note (Signed)
Chronic Blood pressure well controlled Continue amlodipine 5 mg daily, losartan-HCTZ 100-25 mg daily, spironolactone 50 mg daily

## 2022-06-17 NOTE — Assessment & Plan Note (Signed)
Acute Has diflucan at home from prior rx - will take diflucan 150 mg x 1 today

## 2022-06-17 NOTE — Patient Instructions (Addendum)
Your have eustachian tube dysfunction from a viral infection or allergies.  Keep taking the xyzal.      Medications changes include :   start flonase 2 sprays each nostril daily     Return if symptoms worsen or fail to improve.     Eustachian Tube Dysfunction  Eustachian tube dysfunction refers to a condition in which a blockage develops in the narrow passage that connects the middle ear to the back of the nose (eustachian tube). The eustachian tube regulates air pressure in the middle ear by letting air move between the ear and nose. It also helps to drain fluid from the middle ear space. Eustachian tube dysfunction can affect one or both ears. When the eustachian tube does not function properly, air pressure, fluid, or both can build up in the middle ear. What are the causes? This condition occurs when the eustachian tube becomes blocked or cannot open normally. Common causes of this condition include: Ear infections. Colds and other infections that affect the nose, mouth, and throat (upper respiratory tract). Allergies. Irritation from cigarette smoke. Irritation from stomach acid coming up into the esophagus (gastroesophageal reflux). The esophagus is the part of the body that moves food from the mouth to the stomach. Sudden changes in air pressure, such as from descending in an airplane or scuba diving. Abnormal growths in the nose or throat, such as: Growths that line the nose (nasal polyps). Abnormal growth of cells (tumors). Enlarged tissue at the back of the throat (adenoids). What increases the risk? You are more likely to develop this condition if: You smoke. You are overweight. You are a child who has: Certain birth defects of the mouth, such as cleft palate. Large tonsils or adenoids. What are the signs or symptoms? Common symptoms of this condition include: A feeling of fullness in the ear. Ear pain. Clicking or popping noises in the ear. Ringing in the  ear (tinnitus). Hearing loss. Loss of balance. Dizziness. Symptoms may get worse when the air pressure around you changes, such as when you travel to an area of high elevation, fly on an airplane, or go scuba diving. How is this diagnosed? This condition may be diagnosed based on: Your symptoms. A physical exam of your ears, nose, and throat. Tests, such as those that measure: The movement of your eardrum. Your hearing (audiometry). How is this treated? Treatment depends on the cause and severity of your condition. In mild cases, you may relieve your symptoms by moving air into your ears. This is called "popping the ears." In more severe cases, or if you have symptoms of fluid in your ears, treatment may include: Medicines to relieve congestion (decongestants). Medicines that treat allergies (antihistamines). Nasal sprays or ear drops that contain medicines that reduce swelling (steroids). A procedure to drain the fluid in your eardrum. In this procedure, a small tube may be placed in the eardrum to: Drain the fluid. Restore the air in the middle ear space. A procedure to insert a balloon device through the nose to inflate the opening of the eustachian tube (balloon dilation). Follow these instructions at home: Lifestyle Do not do any of the following until your health care provider approves: Travel to high altitudes. Fly in airplanes. Work in a Estate agent or room. Scuba dive. Do not use any products that contain nicotine or tobacco. These products include cigarettes, chewing tobacco, and vaping devices, such as e-cigarettes. If you need help quitting, ask your health care provider. Keep your  ears dry. Wear fitted earplugs during showering and bathing. Dry your ears completely after. General instructions Take over-the-counter and prescription medicines only as told by your health care provider. Use techniques to help pop your ears as recommended by your health care provider.  These may include: Chewing gum. Yawning. Frequent, forceful swallowing. Closing your mouth, holding your nose closed, and gently blowing as if you are trying to blow air out of your nose. Keep all follow-up visits. This is important. Contact a health care provider if: Your symptoms do not go away after treatment. Your symptoms come back after treatment. You are unable to pop your ears. You have: A fever. Pain in your ear. Pain in your head or neck. Fluid draining from your ear. Your hearing suddenly changes. You become very dizzy. You lose your balance. Get help right away if: You have a sudden, severe increase in any of your symptoms. Summary Eustachian tube dysfunction refers to a condition in which a blockage develops in the eustachian tube. It can be caused by ear infections, allergies, inhaled irritants, or abnormal growths in the nose or throat. Symptoms may include ear pain or fullness, hearing loss, or ringing in the ears. Mild cases are treated with techniques to unblock the ears, such as yawning or chewing gum. More severe cases are treated with medicines or procedures. This information is not intended to replace advice given to you by your health care provider. Make sure you discuss any questions you have with your health care provider. Document Revised: 04/29/2020 Document Reviewed: 04/29/2020 Elsevier Patient Education  2023 [employer].

## 2022-06-24 ENCOUNTER — Other Ambulatory Visit (HOSPITAL_COMMUNITY): Payer: Self-pay

## 2022-06-24 ENCOUNTER — Telehealth: Payer: Self-pay | Admitting: Internal Medicine

## 2022-06-24 NOTE — Telephone Encounter (Signed)
Patient called and states that she found her trulicity at [facility] - please send in there.

## 2022-06-24 NOTE — Telephone Encounter (Signed)
Patient is about to miss her second dose of  Dulaglutide (TRULICITY) 4.5 MG/0.5ML SOPN due to her pharmacy being out of it. She asked if there was anything Dr. Lawerance Bach would recommend she do. I advised her to contact other pharmacies, which she will do. But, she would still like to know if Dr. Lawerance Bach would recommend something else just in case. Best call back is [phone].

## 2022-06-25 ENCOUNTER — Other Ambulatory Visit: Payer: Self-pay

## 2022-06-25 ENCOUNTER — Other Ambulatory Visit (HOSPITAL_COMMUNITY): Payer: Self-pay

## 2022-06-25 DIAGNOSIS — E119 Type 2 diabetes mellitus without complications: Secondary | ICD-10-CM

## 2022-06-25 MED ORDER — TRULICITY 4.5 MG/0.5ML ~~LOC~~ SOAJ
4.5000 mg | SUBCUTANEOUS | 1 refills | Status: DC
  Filled 2022-06-25 (×2): qty 2, 28d supply, fill #0

## 2022-06-25 NOTE — Telephone Encounter (Signed)
Sent in today 

## 2022-06-25 NOTE — Telephone Encounter (Signed)
I think most of the medications are difficult to get due to shortage - she needs to call around -- ? [place]

## 2022-06-26 ENCOUNTER — Other Ambulatory Visit (HOSPITAL_COMMUNITY): Payer: Self-pay

## 2022-06-29 ENCOUNTER — Telehealth: Payer: Self-pay | Admitting: Internal Medicine

## 2022-06-29 ENCOUNTER — Other Ambulatory Visit (HOSPITAL_COMMUNITY): Payer: Self-pay

## 2022-06-29 ENCOUNTER — Other Ambulatory Visit: Payer: Self-pay

## 2022-06-29 DIAGNOSIS — E119 Type 2 diabetes mellitus without complications: Secondary | ICD-10-CM

## 2022-06-29 MED ORDER — TRULICITY 4.5 MG/0.5ML ~~LOC~~ SOAJ
4.5000 mg | SUBCUTANEOUS | 1 refills | Status: DC
  Filled 2022-06-29: qty 6, 84d supply, fill #0
  Filled 2022-07-23: qty 2, 28d supply, fill #0
  Filled 2022-09-24 (×2): qty 2, 28d supply, fill #1

## 2022-06-29 NOTE — Telephone Encounter (Signed)
Patient states that her ear sx have not improved with treatment - she would like a a call back at [phone]

## 2022-06-29 NOTE — Telephone Encounter (Signed)
Script faxed to [facility].   She was able to pick up.

## 2022-06-30 NOTE — Telephone Encounter (Signed)
For the eustachian tube function since Flonase is not working I can send her in a few days of steroids.  It has a potential to increase her sugars and appetite.  Prescription pending if she agrees

## 2022-07-01 NOTE — Telephone Encounter (Signed)
Left message for patient today.  If she calls back please see if she wants the round of steroids sent in.

## 2022-07-02 ENCOUNTER — Other Ambulatory Visit (HOSPITAL_COMMUNITY): Payer: Self-pay

## 2022-07-06 MED ORDER — PREDNISONE 20 MG PO TABS: 40.0000 mg | ORAL_TABLET | Freq: Every day | ORAL | 0 refills | Status: AC

## 2022-07-07 ENCOUNTER — Other Ambulatory Visit: Payer: Self-pay | Admitting: Internal Medicine

## 2022-07-08 ENCOUNTER — Other Ambulatory Visit: Payer: Self-pay | Admitting: Internal Medicine

## 2022-07-08 MED ORDER — ONETOUCH ULTRA VI STRP: ORAL_STRIP | 0 refills | Status: DC

## 2022-07-23 ENCOUNTER — Other Ambulatory Visit (HOSPITAL_COMMUNITY): Payer: Self-pay

## 2022-07-31 ENCOUNTER — Other Ambulatory Visit (HOSPITAL_COMMUNITY): Payer: Self-pay

## 2022-08-04 ENCOUNTER — Other Ambulatory Visit (HOSPITAL_COMMUNITY): Payer: Self-pay

## 2022-08-12 ENCOUNTER — Other Ambulatory Visit (HOSPITAL_COMMUNITY): Payer: Self-pay

## 2022-08-21 ENCOUNTER — Other Ambulatory Visit: Payer: Self-pay | Admitting: Internal Medicine

## 2022-08-21 ENCOUNTER — Other Ambulatory Visit (HOSPITAL_COMMUNITY): Payer: Self-pay

## 2022-08-28 ENCOUNTER — Encounter: Payer: Self-pay | Admitting: Gynecologic Oncology

## 2022-08-28 ENCOUNTER — Inpatient Hospital Stay: Payer: 59 | Attending: Gynecologic Oncology | Admitting: Gynecologic Oncology

## 2022-08-28 VITALS — BP 138/94 | HR 104 | Temp 98.2°F | Ht 63.94 in | Wt 285.4 lb

## 2022-08-28 DIAGNOSIS — C541 Malignant neoplasm of endometrium: Secondary | ICD-10-CM | POA: Insufficient documentation

## 2022-08-28 NOTE — Patient Instructions (Signed)
It was good to see you today.  I will call you with your biopsy results next week.  We will tentatively plan on a follow-up visit in 3 months for another biopsy.  In the next 3 months, please set up at least a virtual visit with one of the REI clinics that we talked about.

## 2022-08-28 NOTE — Progress Notes (Signed)
Gynecologic Oncology Return Clinic Visit  08/28/22  Reason for Visit: surveillance  Treatment History: Patient was seen in September 2021 and at that time had a thickened endometrial lining noted on imaging.  She opted to undergo hysteroscopy with D&C rather than endometrial biopsy but did not get this scheduled.  She was started on Provera, which she took for 1 month to regulate her menses, but then discontinued the medication.  Repeat ultrasound in 11/2020 showed an endometrial lining of 9 mm.  Patient was seen again in October 2023 and noted menstrual cycle that had been ongoing for a month.  She has a history of abnormal cycles, will often skip multiple months at a time.  When she does have menses, she will bleed for a prolonged period.  Office pelvic ultrasound in October 2023 showed a uterus measuring 8.7 x 5.2 x 5.6 cm with a 2.1 cm thickened and heterogenous appearing endometrium, increased vascularity noted with suspected polyps.  A stable 1.8 cm fibroid.  Ovaries noted to be normal in appearance.  Given appearance of polyps, recommendation was made for hysteroscopy with D&C.   D&C and polypectomy from 05/07/22 showed endometrioid carcinoma, grade 1. MMR itnact, p53 wildtype.   AMH on 3/15: 0.037   MRI pelvis 05/19/22: 7 mm small, focal nodular appearing endometrial lesion in the lower uterine segment.  No evidence of myometrial invasion.  Normal ovaries. No adenopathy or other evidence of metastatic disease.  Mirena IUD placed on 05/22/22.  Interval History: Reports overall doing well.  She endorses some bleeding most days although describes this as frequently being light pink or just light pink when she wipes.  Sometimes she has passage of small clots.  Denies any significant bleeding or passage of clots like she was previously having.  Has some mild cramping, again much less than previous.  Reports decreased PMS symptoms.  Is working towards weight loss.  Has not yet met with REI.  Past  Medical/Surgical History: Past Medical History:  Diagnosis Date   Arthritis    hands, low back   Depression    Environmental and seasonal allergies    Gallstones    05-05-2022  per pt no issues currently   Genital herpes    GERD (gastroesophageal reflux disease)    05-05-2022  per pt watches diet , not meds   Hyperlipidemia    Hypertension    Insomnia    Menorrhagia    Mixed incontinence urge and stress    Morbid obesity (HCC)    OSA (obstructive sleep apnea) 2011   (05-05-2022  per pt tried  cpap but intolerent and did not follow up)  first dx 2011--- last sleep study done by dr dohmeier 01-17-2020 moderate to severe osa,  cpap on back order ,  finally received 04/ 2022   Type 2 diabetes mellitus (HCC)    followed by pcp;   (05-05-2022  per pt check blood sugar 3-4 times daily,  fasting average blood sugar 108-115)   Uterine leiomyoma    Vitiligo    Wears contact lenses     Past Surgical History:  Procedure Laterality Date   COLONOSCOPY WITH PROPOFOL N/A 09/20/2020   Procedure: COLONOSCOPY WITH PROPOFOL;  Surgeon: Jeani Hawking, MD;  Location: [facility];  Service: Endoscopy;  Laterality: N/A;   DILATATION & CURETTAGE/HYSTEROSCOPY WITH MYOSURE N/A 05/07/2022   Procedure: DILATATION & CURETTAGE/HYSTEROSCOPY WITH MYOSURE;  Surgeon: Edwinna Areola, DO;  Location: [facility];  Service: Gynecology;  Laterality: N/A;  WISDOM TOOTH EXTRACTION      Family History  Problem Relation Age of Onset   Breast cancer Mother    Ovarian cysts Mother    Hypertension Mother    Diabetes Mother    Stroke Father    Diabetes Father    Cancer Father        bone marrow   Breast cancer Other    Colon cancer Neg Hx    Endometrial cancer Neg Hx    Pancreatic cancer Neg Hx    Prostate cancer Neg Hx     Social History   Socioeconomic History   Marital status: Single    Spouse name: Not on file   Number of children: 0   Years of education: some college   Highest  education level: Associate degree: occupational, Scientist, product/process development, or vocational program  Occupational History   Occupation: customer service  Tobacco Use   Smoking status: Never   Smokeless tobacco: Never  Vaping Use   Vaping Use: Former   Substances: THC  Substance and Sexual Activity   Alcohol use: Not Currently    Comment: rare   Drug use: Not Currently    Types: Marijuana   Sexual activity: Yes    Birth control/protection: None  Other Topics Concern   Not on file  Social History Narrative   Lives with her mother.   Right-handed.   Caffeine use: 1-2 cups per day.   Social Determinants of Health   Financial Resource Strain: Medium Risk (06/04/2022)   Overall Financial Resource Strain (CARDIA)    Difficulty of Paying Living Expenses: Somewhat hard  Food Insecurity: Food Insecurity Present (06/04/2022)   Hunger Vital Sign    Worried About Running Out of Food in the Last Year: Sometimes true    Ran Out of Food in the Last Year: Never true  Transportation Needs: No Transportation Needs (06/04/2022)   PRAPARE - Administrator, Civil Service (Medical): No    Lack of Transportation (Non-Medical): No  Physical Activity: Unknown (06/04/2022)   Exercise Vital Sign    Days of Exercise per Week: 0 days    Minutes of Exercise per Session: Not on file  Stress: Stress Concern Present (06/04/2022)   [employer] of Occupational Health - Occupational Stress Questionnaire    Feeling of Stress : Rather much  Social Connections: Socially Isolated (06/04/2022)   Social Connection and Isolation Panel [NHANES]    Frequency of Communication with Friends and Family: More than three times a week    Frequency of Social Gatherings with Friends and Family: Patient declined    Attends Religious Services: Never    Database administrator or Organizations: No    Attends Engineer, structural: Not on file    Marital Status: Never married    Current Medications:  Current Outpatient  Medications:    amLODipine (NORVASC) 5 MG tablet, Take 1 tablet (5 mg total) by mouth in the morning., Disp: 90 tablet, Rfl: 3   blood glucose meter kit and supplies KIT, One touch glucometer.   Use up to four times daily as directed. (FOR E11.9)., Disp: 1 each, Rfl: 0   Dulaglutide (TRULICITY) 4.5 MG/0.5ML SOPN, Inject 4.5 mg as directed once a week., Disp: 6 mL, Rfl: 1   Dulaglutide (TRULICITY) 4.5 MG/0.5ML SOPN, Inject 4.5 mg as directed once a week., Disp: 6 mL, Rfl: 1   fluticasone (FLONASE) 50 MCG/ACT nasal spray, Place 2 sprays into both nostrils daily., Disp: 16 g, Rfl:  6   glimepiride (AMARYL) 2 MG tablet, Take 1 tablet (2 mg total) by mouth in the morning., Disp: 90 tablet, Rfl: 3   glucose blood (ONETOUCH ULTRA) test strip, Check glucose 1-2 times daily, Disp: 100 each, Rfl: 0   Lancets (ONETOUCH DELICA PLUS LANCET33G) MISC, USE TO TEST BLOOD SUGAR 4 TIMES DAILY, Disp: 100 each, Rfl: 0   levocetirizine (XYZAL) 5 MG tablet, Take 1 tablet (5 mg total) by mouth daily as needed for allergies (coughing/drainage)., Disp: 90 tablet, Rfl: 3   levonorgestrel (MIRENA) 20 MCG/DAY IUD, To be placed in the GYN ONC office, Disp: 1 each, Rfl: 0   losartan-hydrochlorothiazide (HYZAAR) 100-25 MG tablet, TAKE 1 TABLET BY MOUTH EVERY MORNING, Disp: 90 tablet, Rfl: 3   medroxyPROGESTERone (PROVERA) 10 MG tablet, Take 1 tablet (10 mg total) by mouth daily., Disp: 30 tablet, Rfl: 6   Melatonin 10 MG TABS, Take 10 mg by mouth at bedtime., Disp: 30 tablet, Rfl: 11   meloxicam (MOBIC) 15 MG tablet, Take 1 tablet (15 mg total) by mouth in the morning., Disp: 90 tablet, Rfl: 3   metFORMIN (GLUCOPHAGE) 1000 MG tablet, Take 1 tablet (1,000 mg total) by mouth in the morning and at bedtime., Disp: 90 tablet, Rfl: 3   Multiple Vitamin (MULTIVITAMIN) capsule, Take 1 capsule by mouth daily., Disp: , Rfl:    PARoxetine (PAXIL) 30 MG tablet, Take 1 tablet (30 mg total) by mouth in the morning., Disp: 90 tablet, Rfl: 3    rosuvastatin (CRESTOR) 10 MG tablet, Take 1 tablet (10 mg total) by mouth in the morning., Disp: 90 tablet, Rfl: 3   spironolactone (ALDACTONE) 50 MG tablet, Take 1 tablet (50 mg total) by mouth in the morning., Disp: 90 tablet, Rfl: 3  Review of Systems: + Vaginal bleeding, joint pain, anxiety, depression Denies appetite changes, fevers, chills, fatigue, unexplained weight changes. Denies hearing loss, neck lumps or masses, mouth sores, ringing in ears or voice changes. Denies cough or wheezing.  Denies shortness of breath. Denies chest pain or palpitations. Denies leg swelling. Denies abdominal distention, pain, blood in stools, constipation, diarrhea, nausea, vomiting, or early satiety. Denies pain with intercourse, dysuria, frequency, hematuria or incontinence. Denies hot flashes, pelvic pain or vaginal discharge.   Denies back pain or muscle pain/cramps. Denies itching, rash, or wounds. Denies dizziness, headaches, numbness or seizures. Denies swollen lymph nodes or glands, denies easy bruising or bleeding. Denies confusion, or decreased concentration.  Physical Exam: BP (!) 138/94 (BP Location: Left Arm, Patient Position: Sitting)   Pulse (!) 104   Temp 98.2 F (36.8 C)   Ht 5' 3.94" (1.624 m)   Wt 285 lb 6.4 oz (129.5 kg)   SpO2 98%   BMI 49.09 kg/m  General: Alert, oriented, no acute distress. HEENT: Posterior oropharynx clear, sclera anicteric. Chest: Unlabored breathing on room air. Abdomen: Obese, soft, nontender.  Normoactive bowel sounds.  No masses or hepatosplenomegaly appreciated.   Extremities: Grossly normal range of motion.  Warm, well perfused.  No edema bilaterally. GU: Normal appearing external genitalia without erythema, excoriation, or lesions.  Speculum exam reveals well rugated vaginal mucosa.  Cervix normal in appearance, IUD strings protruding 3-4 cm from the cervical os.  Endometrial biopsy procedure Preoperative diagnosis: Low-grade endometrial  cancer Postoperative diagnosis: Same as above Physician: Pricilla Holm MD Estimated blood loss: Minimal Specimens: Endometrial biopsy Procedure: After the procedure was discussed with the patient including risks and benefits, she gave consent.  She was then placed in dorsolithotomy position  and a speculum was placed in the vagina.  Once the cervix was well visualized it was cleansed with Betadine x3.  An endometrial Pipelle was then passed to a depth of just under 8 cm.  1 pass was performed with sufficient tissue obtained.  This was placed in formalin.  Overall the patient tolerated the procedure well.  All instruments were removed from the vagina.  Laboratory & Radiologic Studies: None new  Assessment & Plan: Darlene [name] is a 47 y.o. 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  Patient is overall doing very well.  Has had significant decrease in bleeding and cramping symptoms.  Endometrial biopsy performed today without incident.  I will call the patient next week with her biopsy results.  We will tentatively plan on follow-up visit in 3 months for repeat biopsy.  Patient was congratulated on her weight loss thus far and and encouraged to continue this.  We also discussed utility of appointment with an REI over the next 3 months to discuss possible future fertility.  I am concerned about her ability to use her own eggs in the future given low AMH.  20 minutes of total time was spent for this patient encounter, including preparation, face-to-face counseling with the patient and coordination of care, and documentation of the encounter.  Eugene Garnet, MD  Division of Gynecologic Oncology  Department of Obstetrics and Gynecology  [facility]

## 2022-09-01 ENCOUNTER — Telehealth: Payer: Self-pay | Admitting: Gynecologic Oncology

## 2022-09-01 LAB — SURGICAL PATHOLOGY

## 2022-09-01 NOTE — Telephone Encounter (Signed)
Called the patient. Discussed EMB results. Plan for repeat biopsy in 3 months. Encouraged REI discussion. She is unsure that she would be interested in pursuing pregnancy even in 6-9 months ( I have concerns that she would not be able to do so with her own eggs even now). We discussed plan for 3 negative biopsies and then could either take out IUD if she was pursuing pregnancy at that time versus keep IUD in and space biopsies out.  Eugene Garnet MD Gynecologic Oncology

## 2022-09-02 ENCOUNTER — Other Ambulatory Visit: Payer: Self-pay | Admitting: Internal Medicine

## 2022-09-02 ENCOUNTER — Encounter: Payer: Self-pay | Admitting: Internal Medicine

## 2022-09-02 NOTE — Patient Instructions (Addendum)
      Blood work was ordered.   The lab is on the first floor.    Medications changes include :       A referral was ordered and someone will call you to schedule an appointment.     Return in about 6 months (around 03/07/2023) for Physical Exam.

## 2022-09-02 NOTE — Progress Notes (Signed)
Subjective:    Patient ID: Darlene [name], female    DOB: May 12, 1975, 47 y.o.   MRN: [id number]     HPI Darlene [name] is here for follow up of her chronic medical problems.    Medications and allergies reviewed with patient and updated if appropriate.  Current Outpatient Medications on File Prior to Visit  Medication Sig Dispense Refill  . amLODipine (NORVASC) 5 MG tablet Take 1 tablet (5 mg total) by mouth in the morning. 90 tablet 3  . blood glucose meter kit and supplies KIT One touch glucometer.   Use up to four times daily as directed. (FOR E11.9). 1 each 0  . Dulaglutide (TRULICITY) 4.5 MG/0.5ML SOPN Inject 4.5 mg as directed once a week. 6 mL 1  . fluticasone (FLONASE) 50 MCG/ACT nasal spray Place 2 sprays into both nostrils daily. 16 g 6  . glimepiride (AMARYL) 2 MG tablet Take 1 tablet (2 mg total) by mouth in the morning. 90 tablet 3  . glucose blood (ONETOUCH ULTRA) test strip Check glucose 1-2 times daily 100 each 0  . levocetirizine (XYZAL) 5 MG tablet TAKE 1 TABLET BY MOUTH EVERY DAY AS NEEDED FOR allergies (cough/drainage) 90 tablet 3  . levonorgestrel (MIRENA) 20 MCG/DAY IUD To be placed in the GYN ONC office 1 each 0  . losartan-hydrochlorothiazide (HYZAAR) 100-25 MG tablet TAKE 1 TABLET BY MOUTH EVERY MORNING 90 tablet 3  . medroxyPROGESTERone (PROVERA) 10 MG tablet Take 1 tablet (10 mg total) by mouth daily. 30 tablet 6  . Melatonin 10 MG TABS Take 10 mg by mouth at bedtime. 30 tablet 11  . metFORMIN (GLUCOPHAGE) 1000 MG tablet Take 1 tablet (1,000 mg total) by mouth in the morning and at bedtime. 90 tablet 3  . Multiple Vitamin (MULTIVITAMIN) capsule Take 1 capsule by mouth daily.    Marland Kitchen PARoxetine (PAXIL) 30 MG tablet TAKE 1 TABLET BY MOUTH EVERY MORNING 90 tablet 1  . rosuvastatin (CRESTOR) 10 MG tablet Take 1 tablet (10 mg total) by mouth in the morning. 90 tablet 3  . spironolactone (ALDACTONE) 50 MG tablet Take 1 tablet (50 mg total) by mouth in the morning. 90  tablet 3   No current facility-administered medications on file prior to visit.     Review of Systems     Objective:  There were no vitals filed for this visit. BP Readings from Last 3 Encounters:  09/09/22 120/84  08/28/22 (!) 138/94  06/17/22 134/70   Wt Readings from Last 3 Encounters:  09/09/22 288 lb (130.6 kg)  08/28/22 285 lb 6.4 oz (129.5 kg)  06/17/22 293 lb (132.9 kg)   There is no height or weight on file to calculate BMI.    Physical Exam     Lab Results  Component Value Date   WBC 6.5 05/07/2022   HGB 13.3 05/07/2022   HCT 39.0 05/07/2022   PLT 241 05/07/2022   GLUCOSE 91 09/09/2022   CHOL 124 09/09/2022   TRIG 104.0 09/09/2022   HDL 38.50 (L) 09/09/2022   LDLDIRECT 148.6 02/13/2009   LDLCALC 65 09/09/2022   ALT 24 09/09/2022   AST 26 09/09/2022   NA 141 09/09/2022   K 3.7 09/09/2022   CL 100 09/09/2022   CREATININE 0.99 09/09/2022   BUN 9 09/09/2022   CO2 30 09/09/2022   TSH 1.33 02/13/2009   HGBA1C 6.4 09/09/2022   MICROALBUR 7.8 (H) 09/09/2022     Assessment & Plan:  See Problem List for Assessment and Plan of chronic medical problems.    This encounter was created in error - please disregard.

## 2022-09-04 ENCOUNTER — Encounter: Payer: 59 | Admitting: Internal Medicine

## 2022-09-04 DIAGNOSIS — E782 Mixed hyperlipidemia: Secondary | ICD-10-CM

## 2022-09-04 DIAGNOSIS — I10 Essential (primary) hypertension: Secondary | ICD-10-CM

## 2022-09-04 DIAGNOSIS — E1169 Type 2 diabetes mellitus with other specified complication: Secondary | ICD-10-CM

## 2022-09-04 DIAGNOSIS — F3289 Other specified depressive episodes: Secondary | ICD-10-CM

## 2022-09-04 NOTE — Assessment & Plan Note (Signed)
Chronic Regular exercise and healthy diet encouraged Check lipid panel  Continue Crestor 10 mg daily 

## 2022-09-04 NOTE — Assessment & Plan Note (Signed)
Chronic Blood pressure well controlled CMP Continue amlodipine 5 mg daily, losartan-HCTZ 100-25 mg daily, spironolactone 50 mg daily 

## 2022-09-04 NOTE — Assessment & Plan Note (Signed)
Chronic Stable Continue paxil 30 mg daily  

## 2022-09-04 NOTE — Assessment & Plan Note (Signed)
Chronic   Lab Results  Component Value Date   HGBA1C 6.6 (H) 06/09/2022   Sugars controlled Check A1c, urine microalbumin Continue Trulicity 4.5 mg weekly, glimepiride 2 mg daily, metformin 1000 mg twice daily  Stressed weight loss Stressed regular exercise Stressed diabetic diet

## 2022-09-04 NOTE — Assessment & Plan Note (Signed)
Chronic Actively working on [employer] loss-Trulicity is helping Stressed importance of continuing to eat healthy, exercise regularly in addition to taking the Trulicity Discussed getting a good amount of protein, vegetables

## 2022-09-08 ENCOUNTER — Encounter: Payer: Self-pay | Admitting: Internal Medicine

## 2022-09-08 NOTE — Patient Instructions (Addendum)
      Blood work was ordered.   The lab is on the first floor.    Medications changes include :   none    Referral was ordered for sports medicine.    Return in about 6 months (around 03/12/2023) for Physical Exam.

## 2022-09-08 NOTE — Progress Notes (Unsigned)
Subjective:    Patient ID: Darlene [name], female    DOB: August 30, 1975, 47 y.o.   MRN: [id number]     HPI Darlene [name] is here for follow up of her chronic medical problems.  Sugars variable at home.  77-219 ( not common - only a few episodes)  She is not exercising regularly.   Has inflammation in knees - b/l -  has seen ortho.  Has lower back OA as well.   Medications and allergies reviewed with patient and updated if appropriate.  Current Outpatient Medications on File Prior to Visit  Medication Sig Dispense Refill   amLODipine (NORVASC) 5 MG tablet Take 1 tablet (5 mg total) by mouth in the morning. 90 tablet 3   blood glucose meter kit and supplies KIT One touch glucometer.   Use up to four times daily as directed. (FOR E11.9). 1 each 0   Dulaglutide (TRULICITY) 4.5 MG/0.5ML SOPN Inject 4.5 mg as directed once a week. 6 mL 1   fluticasone (FLONASE) 50 MCG/ACT nasal spray Place 2 sprays into both nostrils daily. 16 g 6   glimepiride (AMARYL) 2 MG tablet Take 1 tablet (2 mg total) by mouth in the morning. 90 tablet 3   glucose blood (ONETOUCH ULTRA) test strip Check glucose 1-2 times daily 100 each 0   Lancets (ONETOUCH DELICA PLUS LANCET33G) MISC USE TO TEST BLOOD SUGAR 4 TIMES DAILY 100 each 0   levocetirizine (XYZAL) 5 MG tablet TAKE 1 TABLET BY MOUTH EVERY DAY AS NEEDED FOR allergies (cough/drainage) 90 tablet 3   losartan-hydrochlorothiazide (HYZAAR) 100-25 MG tablet TAKE 1 TABLET BY MOUTH EVERY MORNING 90 tablet 3   medroxyPROGESTERone (PROVERA) 10 MG tablet Take 1 tablet (10 mg total) by mouth daily. 30 tablet 6   Melatonin 10 MG TABS Take 10 mg by mouth at bedtime. 30 tablet 11   meloxicam (MOBIC) 15 MG tablet Take 1 tablet (15 mg total) by mouth in the morning. 90 tablet 3   metFORMIN (GLUCOPHAGE) 1000 MG tablet Take 1 tablet (1,000 mg total) by mouth in the morning and at bedtime. 90 tablet 3   Multiple Vitamin (MULTIVITAMIN) capsule Take 1 capsule by mouth daily.      PARoxetine (PAXIL) 30 MG tablet TAKE 1 TABLET BY MOUTH EVERY MORNING 90 tablet 1   rosuvastatin (CRESTOR) 10 MG tablet Take 1 tablet (10 mg total) by mouth in the morning. 90 tablet 3   spironolactone (ALDACTONE) 50 MG tablet Take 1 tablet (50 mg total) by mouth in the morning. 90 tablet 3   levonorgestrel (MIRENA) 20 MCG/DAY IUD To be placed in the GYN ONC office 1 each 0   No current facility-administered medications on file prior to visit.     Review of Systems  Constitutional:  Negative for fever.  Respiratory:  Negative for cough, shortness of breath and wheezing.   Cardiovascular:  Negative for chest pain, palpitations and leg swelling.  Neurological:  Negative for light-headedness and headaches.       Objective:   Vitals:   09/09/22 1346  BP: 120/84  Pulse: 82  Temp: 98.4 F (36.9 C)  SpO2: 98%   BP Readings from Last 3 Encounters:  09/09/22 120/84  08/28/22 (!) 138/94  06/17/22 134/70   Wt Readings from Last 3 Encounters:  09/09/22 288 lb (130.6 kg)  08/28/22 285 lb 6.4 oz (129.5 kg)  06/17/22 293 lb (132.9 kg)   Body mass index is 49.53 kg/m.  Physical Exam Constitutional:      General: She is not in acute distress.    Appearance: Normal appearance.  HENT:     Head: Normocephalic and atraumatic.  Eyes:     Conjunctiva/sclera: Conjunctivae normal.  Cardiovascular:     Rate and Rhythm: Normal rate and regular rhythm.     Heart sounds: Normal heart sounds.  Pulmonary:     Effort: Pulmonary effort is normal. No respiratory distress.     Breath sounds: Normal breath sounds. No wheezing.  Musculoskeletal:     Cervical back: Neck supple.     Right lower leg: No edema.     Left lower leg: No edema.  Lymphadenopathy:     Cervical: No cervical adenopathy.  Skin:    General: Skin is warm and dry.     Findings: No rash.  Neurological:     Mental Status: She is alert. Mental status is at baseline.  Psychiatric:        Mood and Affect: Mood normal.         Behavior: Behavior normal.       Diabetic Foot Exam - Simple   Simple Foot Form Diabetic Foot exam was performed with the following findings: Yes 09/09/2022  2:21 PM  Visual Inspection No deformities, no ulcerations, no other skin breakdown bilaterally: Yes Sensation Testing Intact to touch and monofilament testing bilaterally: Yes Pulse Check Posterior Tibialis and Dorsalis pulse intact bilaterally: Yes Comments      Lab Results  Component Value Date   WBC 6.5 05/07/2022   HGB 13.3 05/07/2022   HCT 39.0 05/07/2022   PLT 241 05/07/2022   GLUCOSE 126 (H) 06/09/2022   CHOL 123 03/06/2022   TRIG 114.0 03/06/2022   HDL 42.70 03/06/2022   LDLDIRECT 148.6 02/13/2009   LDLCALC 58 03/06/2022   ALT 24 03/06/2022   AST 22 03/06/2022   NA 139 06/09/2022   K 3.7 06/09/2022   CL 102 06/09/2022   CREATININE 0.96 06/09/2022   BUN 8 06/09/2022   CO2 28 06/09/2022   TSH 1.33 02/13/2009   HGBA1C 6.6 (H) 06/09/2022   MICROALBUR 1.4 08/22/2021     Assessment & Plan:    See Problem List for Assessment and Plan of chronic medical problems.

## 2022-09-09 ENCOUNTER — Ambulatory Visit (INDEPENDENT_AMBULATORY_CARE_PROVIDER_SITE_OTHER): Payer: 59 | Admitting: Internal Medicine

## 2022-09-09 ENCOUNTER — Telehealth: Payer: Self-pay | Admitting: *Deleted

## 2022-09-09 VITALS — BP 120/84 | HR 82 | Temp 98.4°F | Ht 63.94 in | Wt 288.0 lb

## 2022-09-09 DIAGNOSIS — I10 Essential (primary) hypertension: Secondary | ICD-10-CM

## 2022-09-09 DIAGNOSIS — Z7984 Long term (current) use of oral hypoglycemic drugs: Secondary | ICD-10-CM

## 2022-09-09 DIAGNOSIS — F3289 Other specified depressive episodes: Secondary | ICD-10-CM

## 2022-09-09 DIAGNOSIS — M170 Bilateral primary osteoarthritis of knee: Secondary | ICD-10-CM

## 2022-09-09 DIAGNOSIS — Z7985 Long-term (current) use of injectable non-insulin antidiabetic drugs: Secondary | ICD-10-CM

## 2022-09-09 DIAGNOSIS — E1169 Type 2 diabetes mellitus with other specified complication: Secondary | ICD-10-CM | POA: Diagnosis not present

## 2022-09-09 DIAGNOSIS — E782 Mixed hyperlipidemia: Secondary | ICD-10-CM

## 2022-09-09 LAB — MICROALBUMIN / CREATININE URINE RATIO
Creatinine,U: 284.8 mg/dL
Microalb Creat Ratio: 2.7 mg/g (ref 0.0–30.0)
Microalb, Ur: 7.8 mg/dL — ABNORMAL HIGH (ref 0.0–1.9)

## 2022-09-09 LAB — COMPREHENSIVE METABOLIC PANEL
ALT: 24 U/L (ref 0–35)
AST: 26 U/L (ref 0–37)
Albumin: 4.4 g/dL (ref 3.5–5.2)
Alkaline Phosphatase: 59 U/L (ref 39–117)
BUN: 9 mg/dL (ref 6–23)
CO2: 30 mEq/L (ref 19–32)
Calcium: 10.3 mg/dL (ref 8.4–10.5)
Chloride: 100 mEq/L (ref 96–112)
Creatinine, Ser: 0.99 mg/dL (ref 0.40–1.20)
GFR: 67.62 mL/min (ref >60.00)
Glucose, Bld: 91 mg/dL (ref 70–99)
Potassium: 3.7 mEq/L (ref 3.5–5.1)
Sodium: 141 mEq/L (ref 135–145)
Total Bilirubin: 0.4 mg/dL (ref 0.2–1.2)
Total Protein: 7.6 g/dL (ref 6.0–8.3)

## 2022-09-09 LAB — LIPID PANEL
Cholesterol: 124 mg/dL (ref 0–200)
HDL: 38.5 mg/dL — ABNORMAL LOW (ref >39.00)
LDL Cholesterol: 65 mg/dL (ref 0–99)
NonHDL: 85.82
Total CHOL/HDL Ratio: 3
Triglycerides: 104 mg/dL (ref 0.0–149.0)
VLDL: 20.8 mg/dL (ref 0.0–40.0)

## 2022-09-09 LAB — HEMOGLOBIN A1C: Hgb A1c MFr Bld: 6.4 % (ref 4.6–6.5)

## 2022-09-09 MED ORDER — ONETOUCH DELICA PLUS LANCET33G MISC: 0 refills | Status: DC

## 2022-09-09 MED ORDER — MELOXICAM 15 MG PO TABS: 15.0000 mg | ORAL_TABLET | Freq: Every morning | ORAL | 3 refills | Status: DC

## 2022-09-09 NOTE — Assessment & Plan Note (Signed)
Chronic Blood pressure well controlled CMP Continue amlodipine 5 mg daily, losartan-HCTZ 100-25 mg daily, spironolactone 50 mg daily 

## 2022-09-09 NOTE — Assessment & Plan Note (Signed)
Chronic Stable Continue paxil 30 mg daily  

## 2022-09-09 NOTE — Assessment & Plan Note (Signed)
Chronic   Lab Results  Component Value Date   HGBA1C 6.6 (H) 06/09/2022   Sugars controlled Check A1c, urine microalbumin Continue Trulicity 4.5 mg weekly, glimepiride 2 mg daily, metformin 1000 mg twice daily  Stressed weight loss Stressed regular exercise Stressed diabetic diet

## 2022-09-09 NOTE — Telephone Encounter (Signed)
Spoke with Ms. Darlene [name] and relayed message from Dr. Pricilla Holm that she may wear a tampon and go swimming. Pt thanked the office for calling and had no further questions or concerns.

## 2022-09-09 NOTE — Assessment & Plan Note (Signed)
Chronic B/l knee OA - severe [facility] to [facility] Has had injections in past Referred to sports med

## 2022-09-09 NOTE — Assessment & Plan Note (Signed)
Chronic Regular exercise and healthy diet encouraged Check lipid panel  Continue Crestor 10 mg daily 

## 2022-09-09 NOTE — Telephone Encounter (Signed)
Spoke with Darlene [name] who called the office asking when she could use a tampon and go swimming. Patient had an office visit with Dr. Pricilla Holm with an endometrial biopsy on Friday, June 28 th.   Pt was advised to wait at least a week and avoid intercourse, tampons, douching, and anything per vagina unless otherwise instructed by provider. Instructed patient that her message would be relayed to Dr. Pricilla Holm and the office would call her back with recommendations since it has been over a week since her biopsy. Pt verbalized understanding and will wait for a call back.

## 2022-09-11 ENCOUNTER — Encounter: Payer: Self-pay | Admitting: Internal Medicine

## 2022-09-24 ENCOUNTER — Other Ambulatory Visit (HOSPITAL_COMMUNITY): Payer: Self-pay

## 2022-09-25 ENCOUNTER — Other Ambulatory Visit (HOSPITAL_COMMUNITY): Payer: Self-pay

## 2022-09-28 ENCOUNTER — Other Ambulatory Visit (HOSPITAL_COMMUNITY): Payer: Self-pay

## 2022-09-30 ENCOUNTER — Inpatient Hospital Stay: Payer: 59 | Attending: Gynecologic Oncology | Admitting: Licensed Clinical Social Worker

## 2022-09-30 NOTE — Progress Notes (Signed)
[facility] CSW Progress Note  Clinical Child psychotherapist  received TC from patient  to inquire about resources to help with premium for insurance (through [employer]). Pt is still unemployed. CSW is not aware of specific foundations that assist with this cost. Recommended looking at if she is eligible for subsidies through the marketplace and gave information on  [employer] for assistance. Also recommended applying for Medicaid due to income change but pt would like to keep her insurance if possible since she knows the coverage.  Pt also said she received information from [facility] that included [employer] information. CSW informed pt that that foundation specifically partners with [facility] and [facility], so she will need to follow-up with them for that assistance.  CSW also began reminding pt of support and counseling resources but then the call cut out. CSW e-mailed information to pt.    Berlie Hatchel E Burnice Vassel, LCSW Clinical Social Worker [facility]

## 2022-11-10 ENCOUNTER — Other Ambulatory Visit: Payer: Self-pay | Admitting: Internal Medicine

## 2022-11-10 ENCOUNTER — Telehealth: Payer: Self-pay | Admitting: *Deleted

## 2022-11-10 NOTE — Telephone Encounter (Signed)
Spoke with Darlene [name] who called the office stating, "I think I may have a yeast infection"  Pt endorses that she has been sexually active for the past 4-5 weeks and prior to that it's been a long time. Pt  states she is having a brown clumpy discharge when she wipes after urinating. Pt also states having pink vaginal bleeding on and off since IUD placement.  She denies pain, fever and chills. Pt states she is having minimal itching and has a "fishy odor" but has had that odor all along and not new with current symptoms.  Advised patient that message would be relayed to provider and the office would call back.

## 2022-11-11 ENCOUNTER — Other Ambulatory Visit: Payer: Self-pay

## 2022-11-11 NOTE — Telephone Encounter (Signed)
Darlene [name] - would you be able to see her? May need testing for BV, yeast.

## 2022-11-11 NOTE — Telephone Encounter (Signed)
Attempted to reach patient. Left voicemail requesting call back.

## 2022-11-11 NOTE — Telephone Encounter (Signed)
Spoke with Darlene [name] who called the office. Pt is going to follow up with her Ob/Gyn Dr. [name] in regards to possible yeast infection. Pt advised if she can't get in to see Dr. [name] call the office back. Pt verbalized understanding.

## 2022-11-13 DIAGNOSIS — N644 Mastodynia: Secondary | ICD-10-CM | POA: Diagnosis not present

## 2022-11-13 DIAGNOSIS — Z113 Encounter for screening for infections with a predominantly sexual mode of transmission: Secondary | ICD-10-CM | POA: Diagnosis not present

## 2022-11-13 DIAGNOSIS — N898 Other specified noninflammatory disorders of vagina: Secondary | ICD-10-CM | POA: Diagnosis not present

## 2022-11-13 DIAGNOSIS — B3731 Acute candidiasis of vulva and vagina: Secondary | ICD-10-CM | POA: Diagnosis not present

## 2022-11-18 ENCOUNTER — Telehealth: Payer: Self-pay | Admitting: *Deleted

## 2022-11-18 NOTE — Telephone Encounter (Signed)
Spoke with Ms. Cieslinski who called the office to let us know that she saw Dr. [name] and her STI panel was negative but she does have a yeast infection and she is being treated with diflucan. Advised patient that her message would be sent to providers and reminded of her appointment on Thursday, Sept. 26 at 1:45 with Dr. Pricilla Holm.

## 2022-11-18 NOTE — Telephone Encounter (Signed)
Thank you :)

## 2022-11-23 NOTE — Progress Notes (Unsigned)
[name] [address] [place] [address] Phone: [phone] Subjective:   Darlene [name], am serving as a scribe for Dr. Antoine Primas.  I'm seeing this patient by the request  of:  Pincus Sanes, MD  CC: Bilateral knee pain, right hip pain  CZY:[id number]  Darlene [name] is a 47 y.o. female coming in with complaint of B knee pain. Patient states weight loss had helped with pain. Knees are bone on bone. Has been doing stretches for hips and that seems to help. Was having shooting pain down the R calf. Much better now. When crossing legs R hip has instant pain. Achy pain in groin R side      Past Medical History:  Diagnosis Date   Arthritis    hands, low back   Depression    Environmental and seasonal allergies    Gallstones    05-05-2022  per pt no issues currently   Genital herpes    GERD (gastroesophageal reflux disease)    05-05-2022  per pt watches diet , not meds   Hyperlipidemia    Hypertension    Insomnia    Menorrhagia    Mixed incontinence urge and stress    Morbid obesity (HCC)    OSA (obstructive sleep apnea) 2011   (05-05-2022  per pt tried  cpap but intolerent and did not follow up)  first dx 2011--- last sleep study done by dr dohmeier 01-17-2020 moderate to severe osa,  cpap on back order ,  finally received 04/ 2022   Type 2 diabetes mellitus (HCC)    followed by pcp;   (05-05-2022  per pt check blood sugar 3-4 times daily,  fasting average blood sugar 108-115)   Uterine leiomyoma    Vitiligo    Wears contact lenses    Past Surgical History:  Procedure Laterality Date   COLONOSCOPY WITH PROPOFOL N/A 09/20/2020   Procedure: COLONOSCOPY WITH PROPOFOL;  Surgeon: Jeani Hawking, MD;  Location: [facility];  Service: Endoscopy;  Laterality: N/A;   DILATATION & CURETTAGE/HYSTEROSCOPY WITH MYOSURE N/A 05/07/2022   Procedure: DILATATION & CURETTAGE/HYSTEROSCOPY WITH MYOSURE;  Surgeon: Edwinna Areola, DO;  Location:  [facility];  Service: Gynecology;  Laterality: N/A;   WISDOM TOOTH EXTRACTION     Social History   Socioeconomic History   Marital status: Single    Spouse name: Not on file   Number of children: 0   Years of education: some college   Highest education level: Associate degree: occupational, Scientist, product/process development, or vocational program  Occupational History   Occupation: customer service  Tobacco Use   Smoking status: Never   Smokeless tobacco: Never  Vaping Use   Vaping status: Former   Substances: THC  Substance and Sexual Activity   Alcohol use: Not Currently    Comment: rare   Drug use: Not Currently    Types: Marijuana   Sexual activity: Yes    Birth control/protection: None  Other Topics Concern   Not on file  Social History Narrative   Lives with her mother.   Right-handed.   Caffeine use: 1-2 cups per day.   Social Determinants of Health   Financial Resource Strain: Medium Risk (06/04/2022)   Overall Financial Resource Strain (CARDIA)    Difficulty of Paying Living Expenses: Somewhat hard  Food Insecurity: Food Insecurity Present (06/04/2022)   Hunger Vital Sign    Worried About Running Out of Food in the Last Year: Sometimes true  Ran Out of Food in the Last Year: Never true  Transportation Needs: No Transportation Needs (06/04/2022)   PRAPARE - Administrator, Civil Service (Medical): No    Lack of Transportation (Non-Medical): No  Physical Activity: Unknown (06/04/2022)   Exercise Vital Sign    Days of Exercise per Week: 0 days    Minutes of Exercise per Session: Not on file  Stress: Stress Concern Present (06/04/2022)   [employer] of Occupational Health - Occupational Stress Questionnaire    Feeling of Stress : Rather much  Social Connections: Socially Isolated (06/04/2022)   Social Connection and Isolation Panel [NHANES]    Frequency of Communication with Friends and Family: More than three times a week    Frequency of Social Gatherings  with Friends and Family: Patient declined    Attends Religious Services: Never    Database administrator or Organizations: No    Attends Engineer, structural: Not on file    Marital Status: Never married   Allergies  Allergen Reactions   Cat Hair Extract Other (See Comments)    Difficulty Breathing, Eye irritation   Yeast-Derived Drug Products Other (See Comments)    Per allergy test   Seasonal Ic [Octacosanol]     Pollen- Watery itchy eyes    Apple Rash   Latex Rash   Rice Rash   Tomato Rash and Other (See Comments)    Itchy throat (rash around mouth)   Wound Dressing Adhesive Rash   Family History  Problem Relation Age of Onset   Breast cancer Mother    Ovarian cysts Mother    Hypertension Mother    Diabetes Mother    Stroke Father    Diabetes Father    Cancer Father        bone marrow   Breast cancer Other    Colon cancer Neg Hx    Endometrial cancer Neg Hx    Pancreatic cancer Neg Hx    Prostate cancer Neg Hx     Current Outpatient Medications (Endocrine & Metabolic):    Dulaglutide (TRULICITY) 4.5 MG/0.5ML SOPN, Inject 4.5 mg as directed once a week.   glimepiride (AMARYL) 2 MG tablet, Take 1 tablet (2 mg total) by mouth in the morning.   levonorgestrel (MIRENA) 20 MCG/DAY IUD, To be placed in the GYN ONC office   medroxyPROGESTERone (PROVERA) 10 MG tablet, Take 1 tablet (10 mg total) by mouth daily.   metFORMIN (GLUCOPHAGE) 1000 MG tablet, Take 1 tablet (1,000 mg total) by mouth in the morning and at bedtime.  Current Outpatient Medications (Cardiovascular):    amLODipine (NORVASC) 5 MG tablet, TAKE 1 TABLET BY MOUTH EVERY MORNING   losartan-hydrochlorothiazide (HYZAAR) 100-25 MG tablet, TAKE 1 TABLET BY MOUTH EVERY MORNING   rosuvastatin (CRESTOR) 10 MG tablet, Take 1 tablet (10 mg total) by mouth in the morning.   spironolactone (ALDACTONE) 50 MG tablet, TAKE 1 TABLET BY MOUTH EVERY MORNING  Current Outpatient Medications (Respiratory):     fluticasone (FLONASE) 50 MCG/ACT nasal spray, Place 2 sprays into both nostrils daily.   levocetirizine (XYZAL) 5 MG tablet, TAKE 1 TABLET BY MOUTH EVERY DAY AS NEEDED FOR allergies (cough/drainage)  Current Outpatient Medications (Analgesics):    meloxicam (MOBIC) 15 MG tablet, Take 1 tablet (15 mg total) by mouth in the morning.   Current Outpatient Medications (Other):    blood glucose meter kit and supplies KIT, One touch glucometer.   Use up to four times daily as  directed. (FOR E11.9).   glucose blood (ONETOUCH ULTRA) test strip, Check glucose 1-2 times daily   Lancets (ONETOUCH DELICA PLUS LANCET33G) MISC, UAD to check sugars 1-2 times a day   Melatonin 10 MG TABS, Take 10 mg by mouth at bedtime.   Multiple Vitamin (MULTIVITAMIN) capsule, Take 1 capsule by mouth daily.   PARoxetine (PAXIL) 30 MG tablet, TAKE 1 TABLET BY MOUTH EVERY MORNING   Reviewed prior external information including notes and imaging from  primary care provider As well as notes that were available from care everywhere and other healthcare systems.  Past medical history, social, surgical and family history all reviewed in electronic medical record.  No pertanent information unless stated regarding to the chief complaint.   Review of Systems:  No headache, visual changes, nausea, vomiting, diarrhea, constipation, dizziness, abdominal pain, skin rash, fevers, chills, night sweats, weight loss, swollen lymph nodes, body aches,  chest pain, shortness of breath, mood changes. POSITIVE muscle aches, joint swelling  Objective  Blood pressure 118/76, pulse 95, height 5\' 3"  (1.6 m), weight 278 lb (126.1 kg), SpO2 96%.   General: No apparent distress alert and oriented x3 mood and affect normal, dressed appropriately.  HEENT: Pupils equal, extraocular movements intact  Respiratory: Patient's speak in full sentences and does not appear short of breath  Cardiovascular: No lower extremity edema, non tender, no erythema   Antalgic gait noted.  Patient does have instability of the knees bilaterally.  Abnormal thigh to calf ratio.  Patient does have tenderness to palpation in the medial aspect of the knees bilaterally right greater than left.  Crepitus noted. Limited internal range of motion of the knee noted   Impression and Recommendations:

## 2022-11-24 ENCOUNTER — Encounter: Payer: Self-pay | Admitting: Family Medicine

## 2022-11-24 ENCOUNTER — Ambulatory Visit (INDEPENDENT_AMBULATORY_CARE_PROVIDER_SITE_OTHER): Payer: 59

## 2022-11-24 ENCOUNTER — Ambulatory Visit: Payer: 59 | Admitting: Family Medicine

## 2022-11-24 VITALS — BP 118/76 | HR 95 | Ht 63.0 in | Wt 278.0 lb

## 2022-11-24 DIAGNOSIS — G8929 Other chronic pain: Secondary | ICD-10-CM

## 2022-11-24 DIAGNOSIS — M160 Bilateral primary osteoarthritis of hip: Secondary | ICD-10-CM | POA: Diagnosis not present

## 2022-11-24 DIAGNOSIS — M25562 Pain in left knee: Secondary | ICD-10-CM | POA: Diagnosis not present

## 2022-11-24 DIAGNOSIS — M170 Bilateral primary osteoarthritis of knee: Secondary | ICD-10-CM | POA: Diagnosis not present

## 2022-11-24 DIAGNOSIS — M25561 Pain in right knee: Secondary | ICD-10-CM

## 2022-11-24 DIAGNOSIS — M25551 Pain in right hip: Secondary | ICD-10-CM | POA: Diagnosis not present

## 2022-11-24 NOTE — Assessment & Plan Note (Signed)
Significant arthritic changes in the knees bilaterally.  Discussed icing regimen and home exercises, discussed which activities to do and which ones to avoid.  Patient is ambulatory and does have abnormal thigh to calf ratio.  Patient does have bone-on-bone osteoarthritic changes of the medial compartment.  Does have instability with valgus and varus force.  Will get custom medial unloader braces which I think will be beneficial.  Discussed the potential for different injections which patient wants to hold on at the moment.  Will consider them though in the long run.  Discussed icing regimen and home exercises.  Increase activity slowly.  Follow-up again in 6 to 8 weeks.

## 2022-11-24 NOTE — Patient Instructions (Addendum)
Do prescribed exercises at least 3x a week Darlene [name] rep will contact you about brace Vitamin D3 2000IU daily  Turmeric 500mg  daily  Tart cherry extract any dose at night  See you again in 6-7 weeks

## 2022-11-25 ENCOUNTER — Encounter: Payer: Self-pay | Admitting: Internal Medicine

## 2022-11-25 ENCOUNTER — Ambulatory Visit (INDEPENDENT_AMBULATORY_CARE_PROVIDER_SITE_OTHER): Payer: 59 | Admitting: Internal Medicine

## 2022-11-25 VITALS — BP 134/82 | HR 82 | Temp 98.6°F | Ht 63.0 in | Wt 279.0 lb

## 2022-11-25 DIAGNOSIS — K143 Hypertrophy of tongue papillae: Secondary | ICD-10-CM | POA: Insufficient documentation

## 2022-11-25 DIAGNOSIS — R8290 Unspecified abnormal findings in urine: Secondary | ICD-10-CM | POA: Insufficient documentation

## 2022-11-25 DIAGNOSIS — R142 Eructation: Secondary | ICD-10-CM | POA: Insufficient documentation

## 2022-11-25 DIAGNOSIS — I10 Essential (primary) hypertension: Secondary | ICD-10-CM

## 2022-11-25 NOTE — Progress Notes (Unsigned)
Gynecologic Oncology Return Clinic Visit  11/26/22  Reason for Visit: surveillance   Treatment History: Patient was seen in September 2021 and at that time had a thickened endometrial lining noted on imaging.  She opted to undergo hysteroscopy with D&C rather than endometrial biopsy but did not get this scheduled.  She was started on Provera, which she took for 1 month to regulate her menses, but then discontinued the medication.  Repeat ultrasound in 11/2020 showed an endometrial lining of 9 mm.  Patient was seen again in October 2023 and noted menstrual cycle that had been ongoing for a month.  She has a history of abnormal cycles, will often skip multiple months at a time.  When she does have menses, she will bleed for a prolonged period.  Office pelvic ultrasound in October 2023 showed a uterus measuring 8.7 x 5.2 x 5.6 cm with a 2.1 cm thickened and heterogenous appearing endometrium, increased vascularity noted with suspected polyps.  A stable 1.8 cm fibroid.  Ovaries noted to be normal in appearance.  Given appearance of polyps, recommendation was made for hysteroscopy with D&C.   D&C and polypectomy from 05/07/22 showed endometrioid carcinoma, grade 1. MMR intact, p53 wildtype.   AMH on 3/15: 0.037   MRI pelvis 05/19/22: 7 mm small, focal nodular appearing endometrial lesion in the lower uterine segment.  No evidence of myometrial invasion.  Normal ovaries. No adenopathy or other evidence of metastatic disease.   Mirena IUD placed on 05/22/22.  EMB on 08/28/22: benign inactive endometrium with hormone effect, no hyperplasia or malignancy.    Interval History: Patient reports overall doing well.  She has had some intermittent spotting since I last saw her.  Had what she calls the most normal thing to a cycle that she has had since the IUD was placed.  This started on the 18th of this month.  She has had very light bleeding although enough to need to wear a pad.  Also has had some slight  cramping but thinks this may be gas related.  She has become sexually active since her last visit.  Developed symptoms concerning for yeast infection.  Took 2 doses of Diflucan last week, significant improvement in her symptoms.  She has not yet scheduled a visit with REI.  Past Medical/Surgical History: Past Medical History:  Diagnosis Date   Arthritis    hands, low back   Depression    Environmental and seasonal allergies    Gallstones    05-05-2022  per pt no issues currently   Genital herpes    GERD (gastroesophageal reflux disease)    05-05-2022  per pt watches diet , not meds   Hyperlipidemia    Hypertension    Insomnia    Menorrhagia    Mixed incontinence urge and stress    Morbid obesity (HCC)    OSA (obstructive sleep apnea) 2011   (05-05-2022  per pt tried  cpap but intolerent and did not follow up)  first dx 2011--- last sleep study done by dr dohmeier 01-17-2020 moderate to severe osa,  cpap on back order ,  finally received 04/ 2022   Type 2 diabetes mellitus (HCC)    followed by pcp;   (05-05-2022  per pt check blood sugar 3-4 times daily,  fasting average blood sugar 108-115)   Uterine leiomyoma    Vitiligo    Wears contact lenses     Past Surgical History:  Procedure Laterality Date   COLONOSCOPY WITH PROPOFOL N/A 09/20/2020  Procedure: COLONOSCOPY WITH PROPOFOL;  Surgeon: Jeani Hawking, MD;  Location: [facility];  Service: Endoscopy;  Laterality: N/A;   DILATATION & CURETTAGE/HYSTEROSCOPY WITH MYOSURE N/A 05/07/2022   Procedure: DILATATION & CURETTAGE/HYSTEROSCOPY WITH MYOSURE;  Surgeon: Edwinna Areola, DO;  Location: [facility];  Service: Gynecology;  Laterality: N/A;   WISDOM TOOTH EXTRACTION      Family History  Problem Relation Age of Onset   Breast cancer Mother    Ovarian cysts Mother    Hypertension Mother    Diabetes Mother    Stroke Father    Diabetes Father    Cancer Father        bone marrow   Breast cancer Other     Colon cancer Neg Hx    Endometrial cancer Neg Hx    Pancreatic cancer Neg Hx    Prostate cancer Neg Hx     Social History   Socioeconomic History   Marital status: Single    Spouse name: Not on file   Number of children: 0   Years of education: some college   Highest education level: Associate degree: occupational, Scientist, product/process development, or vocational program  Occupational History   Occupation: customer service  Tobacco Use   Smoking status: Never   Smokeless tobacco: Never  Vaping Use   Vaping status: Former   Substances: THC  Substance and Sexual Activity   Alcohol use: Not Currently    Comment: rare   Drug use: Not Currently    Types: Marijuana   Sexual activity: Yes    Birth control/protection: None  Other Topics Concern   Not on file  Social History Narrative   Lives with her mother.   Right-handed.   Caffeine use: 1-2 cups per day.   Social Determinants of Health   Financial Resource Strain: Medium Risk (06/04/2022)   Overall Financial Resource Strain (CARDIA)    Difficulty of Paying Living Expenses: Somewhat hard  Food Insecurity: Food Insecurity Present (06/04/2022)   Hunger Vital Sign    Worried About Running Out of Food in the Last Year: Sometimes true    Ran Out of Food in the Last Year: Never true  Transportation Needs: No Transportation Needs (06/04/2022)   PRAPARE - Administrator, Civil Service (Medical): No    Lack of Transportation (Non-Medical): No  Physical Activity: Unknown (06/04/2022)   Exercise Vital Sign    Days of Exercise per Week: 0 days    Minutes of Exercise per Session: Not on file  Stress: Stress Concern Present (06/04/2022)   [employer] of Occupational Health - Occupational Stress Questionnaire    Feeling of Stress : Rather much  Social Connections: Socially Isolated (06/04/2022)   Social Connection and Isolation Panel [NHANES]    Frequency of Communication with Friends and Family: More than three times a week    Frequency of  Social Gatherings with Friends and Family: Patient declined    Attends Religious Services: Never    Database administrator or Organizations: No    Attends Engineer, structural: Not on file    Marital Status: Never married    Current Medications:  Current Outpatient Medications:    amLODipine (NORVASC) 5 MG tablet, TAKE 1 TABLET BY MOUTH EVERY MORNING, Disp: 90 tablet, Rfl: 3   blood glucose meter kit and supplies KIT, One touch glucometer.   Use up to four times daily as directed. (FOR E11.9)., Disp: 1 each, Rfl: 0   Dulaglutide (TRULICITY) 4.5 MG/0.5ML SOPN,  Inject 4.5 mg as directed once a week., Disp: 6 mL, Rfl: 1   fluticasone (FLONASE) 50 MCG/ACT nasal spray, Place 2 sprays into both nostrils daily., Disp: 16 g, Rfl: 6   glimepiride (AMARYL) 2 MG tablet, Take 1 tablet (2 mg total) by mouth in the morning., Disp: 90 tablet, Rfl: 3   glucose blood (ONETOUCH ULTRA) test strip, Check glucose 1-2 times daily, Disp: 100 each, Rfl: 0   Lancets (ONETOUCH DELICA PLUS LANCET33G) MISC, UAD to check sugars 1-2 times a day, Disp: 100 each, Rfl: 0   levocetirizine (XYZAL) 5 MG tablet, TAKE 1 TABLET BY MOUTH EVERY DAY AS NEEDED FOR allergies (cough/drainage), Disp: 90 tablet, Rfl: 3   losartan-hydrochlorothiazide (HYZAAR) 100-25 MG tablet, TAKE 1 TABLET BY MOUTH EVERY MORNING, Disp: 90 tablet, Rfl: 3   meloxicam (MOBIC) 15 MG tablet, Take 1 tablet (15 mg total) by mouth in the morning., Disp: 90 tablet, Rfl: 3   metFORMIN (GLUCOPHAGE) 1000 MG tablet, Take 1 tablet (1,000 mg total) by mouth in the morning and at bedtime., Disp: 90 tablet, Rfl: 3   Multiple Vitamin (MULTIVITAMIN) capsule, Take 1 capsule by mouth daily., Disp: , Rfl:    PARoxetine (PAXIL) 30 MG tablet, TAKE 1 TABLET BY MOUTH EVERY MORNING, Disp: 90 tablet, Rfl: 1   rosuvastatin (CRESTOR) 10 MG tablet, Take 1 tablet (10 mg total) by mouth in the morning., Disp: 90 tablet, Rfl: 3   spironolactone (ALDACTONE) 50 MG tablet, TAKE 1  TABLET BY MOUTH EVERY MORNING, Disp: 90 tablet, Rfl: 3   levonorgestrel (MIRENA) 20 MCG/DAY IUD, To be placed in the GYN ONC office, Disp: 1 each, Rfl: 0   medroxyPROGESTERone (PROVERA) 10 MG tablet, Take 1 tablet (10 mg total) by mouth daily. (Patient not taking: Reported on 11/26/2022), Disp: 30 tablet, Rfl: 6   Melatonin 10 MG TABS, Take 10 mg by mouth at bedtime. (Patient not taking: Reported on 11/26/2022), Disp: 30 tablet, Rfl: 11  Review of Systems: Denies appetite changes, fevers, chills, fatigue, unexplained weight changes. Denies hearing loss, neck lumps or masses, mouth sores, ringing in ears or voice changes. Denies cough or wheezing.  Denies shortness of breath. Denies chest pain or palpitations. Denies leg swelling. Denies abdominal distention, pain, blood in stools, constipation, diarrhea, nausea, vomiting, or early satiety. Denies pain with intercourse, dysuria, frequency, hematuria or incontinence. Denies hot flashes, pelvic pain or vaginal discharge.   Denies joint pain, back pain or muscle pain/cramps. Denies itching, rash, or wounds. Denies dizziness, headaches, numbness or seizures. Denies swollen lymph nodes or glands, denies easy bruising or bleeding. Denies anxiety, depression, confusion, or decreased concentration.  Physical Exam: BP 126/82 (BP Location: Right Arm, Patient Position: Sitting)   Pulse 80   Temp 98.5 F (36.9 C) (Oral)   Resp 18   Ht 5\' 3"  (1.6 m)   Wt 275 lb (124.7 kg)   LMP 11/21/2022 (Approximate)   SpO2 99%   BMI 48.71 kg/m  General: Alert, oriented, no acute distress. HEENT: Posterior oropharynx clear, sclera anicteric. Chest: Unlabored breathing on room air. Abdomen: Obese, soft, nontender.  Normoactive bowel sounds.  No masses or hepatosplenomegaly appreciated.   Extremities: Grossly normal range of motion.  Warm, well perfused.  No edema bilaterally. GU: Normal appearing external genitalia without erythema, excoriation, or lesions.   Speculum exam reveals well rugated vaginal mucosa.  Cervix normal in appearance, IUD strings protruding 3-4 cm from the cervical os.  On bimanual exam, mobile uterus, no cervical firmness or nodularity.  Endometrial biopsy procedure Preoperative diagnosis: Low-grade endometrial cancer Postoperative diagnosis: Same as above Physician: Pricilla Holm MD Estimated blood loss: Minimal Specimens: Endometrial biopsy Procedure: After the procedure was discussed with the patient including risks and benefits, she gave consent.  She was then placed in dorsolithotomy position and a speculum was placed in the vagina.  Once the cervix was well visualized it was cleansed with Betadine x3.  An endometrial Pipelle was then passed to a depth of just under 8 cm.  2 passes were performed with sufficient tissue obtained.  This was placed in formalin.  Overall the patient tolerated the procedure well.  All instruments were removed from the vagina.  Laboratory & Radiologic Studies: None new  Assessment & Plan: Kindyl Barsch Uncapher is a 47 y.o. 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 Last endometrial biopsy was negative for hyperplasia or malignancy.   Patient is overall doing very well.  Having very scant bleeding now.  Endometrial biopsy today performed without difficulty.  I will call her with the biopsy results.  We will tentatively plan on follow-up visit in 3 months.  We have previously discussed my concern about her ability to use her own eggs to achieve future fertility given her low AMH.  At her last visit, we had again talked about having her see an REI specialist to have a realistic conversation about future fertility options.  She has not done this in the last 3 months.  I strongly recommend doing this in the coming 3 months.  We also discussed that the standard of care for  treatment of endometrial cancer is definitive surgery.  While progesterone therapy can be effective 50-90% of the time and leading to regression of either EIN or low-grade endometrioid cancer, there is significant risk of recurrence and need for long term follow-up.  32 minutes of total time was spent for this patient encounter, including preparation, face-to-face counseling with the patient and coordination of care, and documentation of the encounter.  Eugene Garnet, MD  Division of Gynecologic Oncology  Department of Obstetrics and Gynecology  [facility]

## 2022-11-25 NOTE — Patient Instructions (Addendum)
      Restart your multivitamin.   Increase your water intake.        Return for follow up as scheduled.

## 2022-11-25 NOTE — Assessment & Plan Note (Signed)
Subacute States increased burping that smells like fish Likely related to being on Ozempic-this may be delaying her gastric emptying resulting in burping that may smell like fish She denies any GERD so no treatment at this time

## 2022-11-25 NOTE — Assessment & Plan Note (Signed)
New States her urine and stool smell fishy Urine is yellow when she looks at it Likely related to dehydration-most likely it is her urine that smells and not her stool Increase fluid intake Reevaluate next week at her follow-up appointment

## 2022-11-25 NOTE — Assessment & Plan Note (Signed)
Acute Has a slight white coating on tongue No change in taste or discomfort and well Recently treated for vaginal yeast infection with Diflucan which would have taking care of the yeast infection if there was one Possibly related to dehydration-encouraged increased fluid intake Possibly related to vitamin deficiency-she probably is not eating as well as she should secondary to being on Trulicity-encouraged good nutrition-restart multivitamin Follows up next month and we can reevaluate

## 2022-11-25 NOTE — Progress Notes (Signed)
Subjective:    Patient ID: Darlene [name], female    DOB: 25-Dec-1975, 47 y.o.   MRN: [id number]      HPI Joylyn is here for  Chief Complaint  Patient presents with   Whiteness on tongue    Whiteness on tongue and sometimes bowel movements smell fishy at times; Patient saw GYN 11/13/22 and full work up was done regarding STD panel, etc.     Whiteness on tongue - can not get it clear.  No change in taste.  In the last week or two seems dry.  She thinks she is drinking enough fluids.   Fishy taste and smell with burping.  May feel nauseous with drinking water at times or after she eats.  Denies reflux/gerd.     Fishy odor when she poops or urinates.     Saw gyn 9/13 - had yeast infection - treated with diflucan.  Tested for [facility].   Drinking a good amount of water but may need to drink more.  Not taking mvi now.    Medications and allergies reviewed with patient and updated if appropriate.  Current Outpatient Medications on File Prior to Visit  Medication Sig Dispense Refill   amLODipine (NORVASC) 5 MG tablet TAKE 1 TABLET BY MOUTH EVERY MORNING 90 tablet 3   blood glucose meter kit and supplies KIT One touch glucometer.   Use up to four times daily as directed. (FOR E11.9). 1 each 0   Dulaglutide (TRULICITY) 4.5 MG/0.5ML SOPN Inject 4.5 mg as directed once a week. 6 mL 1   fluticasone (FLONASE) 50 MCG/ACT nasal spray Place 2 sprays into both nostrils daily. 16 g 6   glimepiride (AMARYL) 2 MG tablet Take 1 tablet (2 mg total) by mouth in the morning. 90 tablet 3   glucose blood (ONETOUCH ULTRA) test strip Check glucose 1-2 times daily 100 each 0   Lancets (ONETOUCH DELICA PLUS LANCET33G) MISC UAD to check sugars 1-2 times a day 100 each 0   levocetirizine (XYZAL) 5 MG tablet TAKE 1 TABLET BY MOUTH EVERY DAY AS NEEDED FOR allergies (cough/drainage) 90 tablet 3   losartan-hydrochlorothiazide (HYZAAR) 100-25 MG tablet TAKE 1 TABLET BY MOUTH EVERY MORNING 90 tablet 3    medroxyPROGESTERone (PROVERA) 10 MG tablet Take 1 tablet (10 mg total) by mouth daily. 30 tablet 6   Melatonin 10 MG TABS Take 10 mg by mouth at bedtime. 30 tablet 11   meloxicam (MOBIC) 15 MG tablet Take 1 tablet (15 mg total) by mouth in the morning. 90 tablet 3   metFORMIN (GLUCOPHAGE) 1000 MG tablet Take 1 tablet (1,000 mg total) by mouth in the morning and at bedtime. 90 tablet 3   Multiple Vitamin (MULTIVITAMIN) capsule Take 1 capsule by mouth daily.     PARoxetine (PAXIL) 30 MG tablet TAKE 1 TABLET BY MOUTH EVERY MORNING 90 tablet 1   rosuvastatin (CRESTOR) 10 MG tablet Take 1 tablet (10 mg total) by mouth in the morning. 90 tablet 3   spironolactone (ALDACTONE) 50 MG tablet TAKE 1 TABLET BY MOUTH EVERY MORNING 90 tablet 3   levonorgestrel (MIRENA) 20 MCG/DAY IUD To be placed in the GYN ONC office 1 each 0   No current facility-administered medications on file prior to visit.    Review of Systems  HENT:  Negative for trouble swallowing.   Gastrointestinal:  Positive for constipation (at times) and nausea. Negative for abdominal pain, blood in stool (no melena) and diarrhea.  No gerd  Genitourinary:  Negative for dysuria, frequency and hematuria.       Objective:   Vitals:   11/25/22 0816  BP: 134/82  Pulse: 82  Temp: 98.6 F (37 C)  SpO2: 97%   BP Readings from Last 3 Encounters:  11/25/22 134/82  11/24/22 118/76  09/09/22 120/84   Wt Readings from Last 3 Encounters:  11/25/22 279 lb (126.6 kg)  11/24/22 278 lb (126.1 kg)  09/09/22 288 lb (130.6 kg)   Body mass index is 49.42 kg/m.    Physical Exam Constitutional:      General: She is not in acute distress.    Appearance: Normal appearance.  HENT:     Head: Normocephalic and atraumatic.     Mouth/Throat:     Mouth: Mucous membranes are moist.     Comments: Slight white coating diffusely on tongue.  No oral lesions Eyes:     Conjunctiva/sclera: Conjunctivae normal.  Cardiovascular:     Rate and  Rhythm: Normal rate and regular rhythm.     Heart sounds: Normal heart sounds.  Pulmonary:     Effort: Pulmonary effort is normal. No respiratory distress.     Breath sounds: Normal breath sounds. No wheezing.  Musculoskeletal:     Cervical back: Neck supple.     Right lower leg: No edema.     Left lower leg: No edema.  Lymphadenopathy:     Cervical: No cervical adenopathy.  Skin:    General: Skin is warm and dry.     Findings: No rash.  Neurological:     Mental Status: She is alert. Mental status is at baseline.  Psychiatric:        Mood and Affect: Mood normal.        Behavior: Behavior normal.            Assessment & Plan:    See Problem List for Assessment and Plan of chronic medical problems.

## 2022-11-25 NOTE — Assessment & Plan Note (Signed)
Chronic Blood pressure well controlled Continue amlodipine 5 mg daily, losartan-HCTZ 100-25 mg daily, spironolactone 50 mg daily

## 2022-11-26 ENCOUNTER — Inpatient Hospital Stay: Payer: 59 | Attending: Gynecologic Oncology | Admitting: Gynecologic Oncology

## 2022-11-26 ENCOUNTER — Encounter: Payer: Self-pay | Admitting: Gynecologic Oncology

## 2022-11-26 VITALS — BP 126/82 | HR 80 | Temp 98.5°F | Resp 18 | Ht 63.0 in | Wt 275.0 lb

## 2022-11-26 DIAGNOSIS — Z79890 Hormone replacement therapy (postmenopausal): Secondary | ICD-10-CM | POA: Insufficient documentation

## 2022-11-26 DIAGNOSIS — C541 Malignant neoplasm of endometrium: Secondary | ICD-10-CM | POA: Diagnosis not present

## 2022-11-26 DIAGNOSIS — Z6841 Body Mass Index (BMI) 40.0 and over, adult: Secondary | ICD-10-CM | POA: Diagnosis not present

## 2022-11-26 NOTE — Patient Instructions (Signed)
It was good to see you today.  I will call you with your biopsy results.  Please reach out to [facility] in [place] to at least schedule a phone consultation.

## 2022-11-30 LAB — SURGICAL PATHOLOGY

## 2022-12-01 ENCOUNTER — Telehealth: Payer: Self-pay | Admitting: Gynecologic Oncology

## 2022-12-01 ENCOUNTER — Telehealth: Payer: Self-pay | Admitting: *Deleted

## 2022-12-01 NOTE — Telephone Encounter (Signed)
Patient called the office and left a message stating she hasn't heard back in regards to biopsy results. Message forward to providers.

## 2022-12-01 NOTE — Telephone Encounter (Signed)
Called patient to discuss EMB, no answer. Left voicemail. Let her know I will send mychart message and will try her again tomorrow.  Eugene Garnet, MD

## 2022-12-02 ENCOUNTER — Telehealth: Payer: Self-pay | Admitting: Gynecologic Oncology

## 2022-12-02 NOTE — Telephone Encounter (Signed)
Called the patient to discuss biopsy result. NO EIN or cancer. There is some complex hyperplasia (discussed with her this abnormality and progressed from last biopsy but not EIN). She is still voicing wanting to keep her uterus but not being ready to discuss pregnancy. I stressed the importance of this question given her age and low AMH. I continue to strongly encourage her to have a consultation with REI prior to our next visit. She voices understanding and says she will think about it.  Eugene Garnet MD Gynecologic Oncology

## 2022-12-10 ENCOUNTER — Encounter: Payer: Self-pay | Admitting: Internal Medicine

## 2022-12-10 NOTE — Patient Instructions (Addendum)
Blood work was ordered.   The lab is on the first floor.    Medications changes include :   none     Return in about 6 months (around 06/11/2023) for follow up.    Health Maintenance, Female Adopting a healthy lifestyle and getting preventive care are important in promoting health and wellness. Ask your health care provider about: The right schedule for you to have regular tests and exams. Things you can do on your own to prevent diseases and keep yourself healthy. What should I know about diet, weight, and exercise? Eat a healthy diet  Eat a diet that includes plenty of vegetables, fruits, low-fat dairy products, and lean protein. Do not eat a lot of foods that are high in solid fats, added sugars, or sodium. Maintain a healthy weight Body mass index (BMI) is used to identify weight problems. It estimates body fat based on height and weight. Your health care provider can help determine your BMI and help you achieve or maintain a healthy weight. Get regular exercise Get regular exercise. This is one of the most important things you can do for your health. Most adults should: Exercise for at least 150 minutes each week. The exercise should increase your heart rate and make you sweat (moderate-intensity exercise). Do strengthening exercises at least twice a week. This is in addition to the moderate-intensity exercise. Spend less time sitting. Even light physical activity can be beneficial. Watch cholesterol and blood lipids Have your blood tested for lipids and cholesterol at 48 years of age, then have this test every 5 years. Have your cholesterol levels checked more often if: Your lipid or cholesterol levels are high. You are older than 48 years of age. You are at high risk for heart disease. What should I know about cancer screening? Depending on your health history and family history, you may need to have cancer screening at various ages. This may include screening  for: Breast cancer. Cervical cancer. Colorectal cancer. Skin cancer. Lung cancer. What should I know about heart disease, diabetes, and high blood pressure? Blood pressure and heart disease High blood pressure causes heart disease and increases the risk of stroke. This is more likely to develop in people who have high blood pressure readings or are overweight. Have your blood pressure checked: Every 3-5 years if you are 8-80 years of age. Every year if you are 14 years old or older. Diabetes Have regular diabetes screenings. This checks your fasting blood sugar level. Have the screening done: Once every three years after age 31 if you are at a normal weight and have a low risk for diabetes. More often and at a younger age if you are overweight or have a high risk for diabetes. What should I know about preventing infection? Hepatitis B If you have a higher risk for hepatitis B, you should be screened for this virus. Talk with your health care provider to find out if you are at risk for hepatitis B infection. Hepatitis C Testing is recommended for: Everyone born from [birth year] through 1966. Anyone with known risk factors for hepatitis C. Sexually transmitted infections (STIs) Get screened for STIs, including gonorrhea and chlamydia, if: You are sexually active and are younger than 48 years of age. You are older than 48 years of age and your health care provider tells you that you are at risk for this type of infection. Your sexual activity has changed since you were last screened, and you are  at increased risk for chlamydia or gonorrhea. Ask your health care provider if you are at risk. Ask your health care provider about whether you are at high risk for HIV. Your health care provider may recommend a prescription medicine to help prevent HIV infection. If you choose to take medicine to prevent HIV, you should first get tested for HIV. You should then be tested every 3 months for as long as you  are taking the medicine. Pregnancy If you are about to stop having your period (premenopausal) and you may become pregnant, seek counseling before you get pregnant. Take 400 to 800 micrograms (mcg) of folic acid every day if you become pregnant. Ask for birth control (contraception) if you want to prevent pregnancy. Osteoporosis and menopause Osteoporosis is a disease in which the bones lose minerals and strength with aging. This can result in bone fractures. If you are 20 years old or older, or if you are at risk for osteoporosis and fractures, ask your health care provider if you should: Be screened for bone loss. Take a calcium or vitamin D supplement to lower your risk of fractures. Be given hormone replacement therapy (HRT) to treat symptoms of menopause. Follow these instructions at home: Alcohol use Do not drink alcohol if: Your health care provider tells you not to drink. You are pregnant, may be pregnant, or are planning to become pregnant. If you drink alcohol: Limit how much you have to: 0-1 drink a day. Know how much alcohol is in your drink. In the [place] one drink equals one 12 oz bottle of beer (355 mL), one 5 oz glass of wine (148 mL), or one 1 oz glass of hard liquor (44 mL). Lifestyle Do not use any products that contain nicotine or tobacco. These products include cigarettes, chewing tobacco, and vaping devices, such as e-cigarettes. If you need help quitting, ask your health care provider. Do not use street drugs. Do not share needles. Ask your health care provider for help if you need support or information about quitting drugs. General instructions Schedule regular health, dental, and eye exams. Stay current with your vaccines. Tell your health care provider if: You often feel depressed. You have ever been abused or do not feel safe at home. Summary Adopting a healthy lifestyle and getting preventive care are important in promoting health and wellness. Follow your  health care provider's instructions about healthy diet, exercising, and getting tested or screened for diseases. Follow your health care provider's instructions on monitoring your cholesterol and blood pressure. This information is not intended to replace advice given to you by your health care provider. Make sure you discuss any questions you have with your health care provider. Document Revised: 07/08/2020 Document Reviewed: 07/08/2020 Elsevier Patient Education  2024 [employer].

## 2022-12-10 NOTE — Progress Notes (Signed)
Subjective:    Patient ID: Darlene [name], female    DOB: 07-04-1975, 47 y.o.   MRN: [id number]      HPI Darlene [name] is here for a Physical exam and her chronic medical problems.   Knee OA - just on fitted for braces.  Taking mobic  Sugar has been well controlled - less than 114.  She is only taking the metformin once a day instead of twice a day.   Medications and allergies reviewed with patient and updated if appropriate.  Current Outpatient Medications on File Prior to Visit  Medication Sig Dispense Refill   amLODipine (NORVASC) 5 MG tablet TAKE 1 TABLET BY MOUTH EVERY MORNING 90 tablet 3   blood glucose meter kit and supplies KIT One touch glucometer.   Use up to four times daily as directed. (FOR E11.9). 1 each 0   Dulaglutide (TRULICITY) 4.5 MG/0.5ML SOPN Inject 4.5 mg as directed once a week. 6 mL 1   fluticasone (FLONASE) 50 MCG/ACT nasal spray Place 2 sprays into both nostrils daily. 16 g 6   glimepiride (AMARYL) 2 MG tablet Take 1 tablet (2 mg total) by mouth in the morning. 90 tablet 3   glucose blood (ONETOUCH ULTRA) test strip Check glucose 1-2 times daily 100 each 0   Lancets (ONETOUCH DELICA PLUS LANCET33G) MISC UAD to check sugars 1-2 times a day 100 each 0   levocetirizine (XYZAL) 5 MG tablet TAKE 1 TABLET BY MOUTH EVERY DAY AS NEEDED FOR allergies (cough/drainage) 90 tablet 3   losartan-hydrochlorothiazide (HYZAAR) 100-25 MG tablet TAKE 1 TABLET BY MOUTH EVERY MORNING 90 tablet 3   meloxicam (MOBIC) 15 MG tablet Take 1 tablet (15 mg total) by mouth in the morning. 90 tablet 3   Multiple Vitamin (MULTIVITAMIN) capsule Take 1 capsule by mouth daily.     PARoxetine (PAXIL) 30 MG tablet TAKE 1 TABLET BY MOUTH EVERY MORNING 90 tablet 1   rosuvastatin (CRESTOR) 10 MG tablet Take 1 tablet (10 mg total) by mouth in the morning. 90 tablet 3   spironolactone (ALDACTONE) 50 MG tablet TAKE 1 TABLET BY MOUTH EVERY MORNING 90 tablet 3   levonorgestrel (MIRENA) 20 MCG/DAY IUD To  be placed in the GYN ONC office 1 each 0   No current facility-administered medications on file prior to visit.    Review of Systems  Constitutional:  Negative for fever.  Eyes:  Negative for visual disturbance.  Respiratory:  Negative for cough, shortness of breath and wheezing.   Cardiovascular:  Positive for palpitations (rare). Negative for chest pain and leg swelling.  Gastrointestinal:  Positive for constipation. Negative for abdominal pain, blood in stool and diarrhea.       No gerd  Genitourinary:  Negative for dysuria.  Musculoskeletal:  Positive for arthralgias (knees, hip). Negative for back pain.  Skin:  Negative for rash.  Neurological:  Positive for light-headedness (occ). Negative for headaches.  Psychiatric/Behavioral:  Negative for dysphoric mood. The patient is not nervous/anxious.        Objective:   Vitals:   12/11/22 0936 12/11/22 1021  BP: (!) 144/80 134/78  Pulse: 78   Temp: 98.2 F (36.8 C)   SpO2: 98%    Filed Weights   12/11/22 0936  Weight: 272 lb (123.4 kg)   Body mass index is 48.18 kg/m.  BP Readings from Last 3 Encounters:  12/11/22 134/78  11/26/22 126/82  11/25/22 134/82    Wt Readings from Last 3 Encounters:  12/11/22  272 lb (123.4 kg)  11/26/22 275 lb (124.7 kg)  11/25/22 279 lb (126.6 kg)       Physical Exam Constitutional: She appears well-developed and well-nourished. No distress.  HENT:  Head: Normocephalic and atraumatic.  Right Ear: External ear normal. Normal ear canal and TM Left Ear: External ear normal.  Normal ear canal and TM Mouth/Throat: Oropharynx is clear and moist.  Eyes: Conjunctivae normal.  Neck: Neck supple. No tracheal deviation present. No thyromegaly present.  No carotid bruit  Cardiovascular: Normal rate, regular rhythm and normal heart sounds.   No murmur heard.  No edema. Pulmonary/Chest: Effort normal and breath sounds normal. No respiratory distress. She has no wheezes. She has no rales.   Breast: deferred   Abdominal: Soft. She exhibits no distension. There is no tenderness.  Lymphadenopathy: She has no cervical adenopathy.  Skin: Skin is warm and dry. She is not diaphoretic.  Psychiatric: She has a normal mood and affect. Her behavior is normal.     Lab Results  Component Value Date   WBC 6.5 05/07/2022   HGB 13.3 05/07/2022   HCT 39.0 05/07/2022   PLT 241 05/07/2022   GLUCOSE 91 09/09/2022   CHOL 124 09/09/2022   TRIG 104.0 09/09/2022   HDL 38.50 (L) 09/09/2022   LDLDIRECT 148.6 02/13/2009   LDLCALC 65 09/09/2022   ALT 24 09/09/2022   AST 26 09/09/2022   NA 141 09/09/2022   K 3.7 09/09/2022   CL 100 09/09/2022   CREATININE 0.99 09/09/2022   BUN 9 09/09/2022   CO2 30 09/09/2022   TSH 1.33 02/13/2009   HGBA1C 6.4 09/09/2022   MICROALBUR 7.8 (H) 09/09/2022         Assessment & Plan:   Physical exam: Screening blood work  ordered Exercise  not regular - just fitted with braces for knees - will try to be more active - walking some Weight  obese - working on weight loss Substance abuse  none   Reviewed recommended immunizations.   Health Maintenance  Topic Date Due   OPHTHALMOLOGY EXAM  Never done   Cervical Cancer Screening (HPV/Pap Cotest)  Never done   DTaP/Tdap/Td (2 - Tdap) 01/30/2019   COVID-19 Vaccine (3 - Mixed Product risk series) 12/27/2022 (Originally 09/16/2019)   HEMOGLOBIN A1C  03/12/2023   Diabetic kidney evaluation - eGFR measurement  09/09/2023   Diabetic kidney evaluation - Urine ACR  09/09/2023   FOOT EXAM  09/09/2023   Colonoscopy  09/21/2030   HIV Screening  Completed   HPV VACCINES  Aged Out   INFLUENZA VACCINE  Discontinued   Hepatitis C Screening  Discontinued          See Problem List for Assessment and Plan of chronic medical problems.

## 2022-12-11 ENCOUNTER — Ambulatory Visit (INDEPENDENT_AMBULATORY_CARE_PROVIDER_SITE_OTHER): Payer: Self-pay | Admitting: Internal Medicine

## 2022-12-11 VITALS — BP 134/78 | HR 78 | Temp 98.2°F | Ht 63.0 in | Wt 272.0 lb

## 2022-12-11 DIAGNOSIS — E782 Mixed hyperlipidemia: Secondary | ICD-10-CM | POA: Diagnosis not present

## 2022-12-11 DIAGNOSIS — I10 Essential (primary) hypertension: Secondary | ICD-10-CM

## 2022-12-11 DIAGNOSIS — Z7984 Long term (current) use of oral hypoglycemic drugs: Secondary | ICD-10-CM

## 2022-12-11 DIAGNOSIS — F3289 Other specified depressive episodes: Secondary | ICD-10-CM

## 2022-12-11 DIAGNOSIS — E1169 Type 2 diabetes mellitus with other specified complication: Secondary | ICD-10-CM

## 2022-12-11 DIAGNOSIS — Z0000 Encounter for general adult medical examination without abnormal findings: Secondary | ICD-10-CM

## 2022-12-11 DIAGNOSIS — Z794 Long term (current) use of insulin: Secondary | ICD-10-CM | POA: Diagnosis not present

## 2022-12-11 LAB — CBC WITH DIFFERENTIAL/PLATELET
Basophils Absolute: 0 10*3/uL (ref 0.0–0.1)
Basophils Relative: 0.6 % (ref 0.0–3.0)
Eosinophils Absolute: 0 10*3/uL (ref 0.0–0.7)
Eosinophils Relative: 0.6 % (ref 0.0–5.0)
HCT: 43.6 % (ref 36.0–46.0)
Hemoglobin: 14.2 g/dL (ref 12.0–15.0)
Lymphocytes Relative: 28.3 % (ref 12.0–46.0)
Lymphs Abs: 2.1 10*3/uL (ref 0.7–4.0)
MCHC: 32.7 g/dL (ref 30.0–36.0)
MCV: 90 fL (ref 78.0–100.0)
Monocytes Absolute: 0.5 10*3/uL (ref 0.1–1.0)
Monocytes Relative: 7.1 % (ref 3.0–12.0)
Neutro Abs: 4.8 10*3/uL (ref 1.4–7.7)
Neutrophils Relative %: 63.4 % (ref 43.0–77.0)
Platelets: 304 10*3/uL (ref 150.0–400.0)
RBC: 4.84 Mil/uL (ref 3.87–5.11)
RDW: 13.8 % (ref 11.5–15.5)
WBC: 7.5 10*3/uL (ref 4.0–10.5)

## 2022-12-11 LAB — COMPREHENSIVE METABOLIC PANEL
ALT: 15 U/L (ref 0–35)
AST: 17 U/L (ref 0–37)
Albumin: 4.4 g/dL (ref 3.5–5.2)
Alkaline Phosphatase: 63 U/L (ref 39–117)
BUN: 13 mg/dL (ref 6–23)
CO2: 32 meq/L (ref 19–32)
Calcium: 10.3 mg/dL (ref 8.4–10.5)
Chloride: 100 meq/L (ref 96–112)
Creatinine, Ser: 1.06 mg/dL (ref 0.40–1.20)
GFR: 62.19 mL/min (ref >60.00)
Glucose, Bld: 103 mg/dL — ABNORMAL HIGH (ref 70–99)
Potassium: 3.9 meq/L (ref 3.5–5.1)
Sodium: 141 meq/L (ref 135–145)
Total Bilirubin: 0.4 mg/dL (ref 0.2–1.2)
Total Protein: 7.2 g/dL (ref 6.0–8.3)

## 2022-12-11 LAB — HEMOGLOBIN A1C: Hgb A1c MFr Bld: 5.9 % (ref 4.6–6.5)

## 2022-12-11 LAB — TSH: TSH: 2.43 u[IU]/mL (ref 0.35–5.50)

## 2022-12-11 MED ORDER — METFORMIN HCL 1000 MG PO TABS: 1000.0000 mg | ORAL_TABLET | Freq: Every day | ORAL | Status: DC

## 2022-12-11 NOTE — Assessment & Plan Note (Addendum)
Chronic Blood pressure initially elevated, but on recheck it is better controlled CBC, CMP Continue weight loss efforts Continue amlodipine 5 mg daily, losartan-HCTZ 100-25 mg daily, spironolactone 50 mg daily

## 2022-12-11 NOTE — Assessment & Plan Note (Signed)
Chronic Stable Continue paxil 30 mg daily  

## 2022-12-11 NOTE — Assessment & Plan Note (Addendum)
Chronic Regular exercise and healthy diet encouraged LDL well-controlled Continue Crestor 10 mg daily

## 2022-12-11 NOTE — Assessment & Plan Note (Addendum)
Chronic  Lab Results  Component Value Date   HGBA1C 6.4 09/09/2022   Sugars controlled Check A1c Continue Trulicity 4.5 mg weekly, glimepiride 2 mg daily, metformin 1000 mg daily (only taking once a day so we will continue once a day) If A1c is good can consider stopping glimepiride Stressed weight loss Stressed regular exercise Stressed diabetic diet

## 2022-12-12 ENCOUNTER — Other Ambulatory Visit: Payer: Self-pay | Admitting: Internal Medicine

## 2023-01-05 NOTE — Progress Notes (Unsigned)
[name] [address] [place] [address] Phone: [phone] Subjective:   Bruce Donath, am serving as a scribe for Dr. Antoine Primas.  I'm seeing this patient by the request  of:  Pincus Sanes, MD  CC: bilateral knee pain   [id number]  11/24/2022 Significant arthritic changes in the knees bilaterally.  Discussed icing regimen and home exercises, discussed which activities to do and which ones to avoid.  Patient is ambulatory and does have abnormal thigh to calf ratio.  Patient does have bone-on-bone osteoarthritic changes of the medial compartment.  Does have instability with valgus and varus force.  Will get custom medial unloader braces which I think will be beneficial.  Discussed the potential for different injections which patient wants to hold on at the moment.  Will consider them though in the long run.  Discussed icing regimen and home exercises.  Increase activity slowly.  Follow-up again in 6 to 8 weeks.     Updated 01/06/2023 Georgie E Mooers is a 47 y.o. female coming in with complaint of B knee pain. Patient states that custom braces are effective. Able to stand longer and move with less pressure on patella. Also using recovery sandals in the house which has been helpful.          Past Medical History:  Diagnosis Date   Arthritis    hands, low back   Depression    Environmental and seasonal allergies    Gallstones    05-05-2022  per pt no issues currently   Genital herpes    GERD (gastroesophageal reflux disease)    05-05-2022  per pt watches diet , not meds   Hyperlipidemia    Hypertension    Insomnia    Menorrhagia    Mixed incontinence urge and stress    Morbid obesity (HCC)    OSA (obstructive sleep apnea) 2011   (05-05-2022  per pt tried  cpap but intolerent and did not follow up)  first dx 2011--- last sleep study done by dr dohmeier 01-17-2020 moderate to severe osa,  cpap on back order ,  finally received  04/ 2022   Type 2 diabetes mellitus (HCC)    followed by pcp;   (05-05-2022  per pt check blood sugar 3-4 times daily,  fasting average blood sugar 108-115)   Uterine leiomyoma    Vitiligo    Wears contact lenses    Past Surgical History:  Procedure Laterality Date   COLONOSCOPY WITH PROPOFOL N/A 09/20/2020   Procedure: COLONOSCOPY WITH PROPOFOL;  Surgeon: Jeani Hawking, MD;  Location: [facility];  Service: Endoscopy;  Laterality: N/A;   DILATATION & CURETTAGE/HYSTEROSCOPY WITH MYOSURE N/A 05/07/2022   Procedure: DILATATION & CURETTAGE/HYSTEROSCOPY WITH MYOSURE;  Surgeon: Edwinna Areola, DO;  Location: [facility];  Service: Gynecology;  Laterality: N/A;   WISDOM TOOTH EXTRACTION     Social History   Socioeconomic History   Marital status: Single    Spouse name: Not on file   Number of children: 0   Years of education: some college   Highest education level: Associate degree: occupational, Scientist, product/process development, or vocational program  Occupational History   Occupation: customer service  Tobacco Use   Smoking status: Never   Smokeless tobacco: Never  Vaping Use   Vaping status: Former   Substances: THC  Substance and Sexual Activity   Alcohol use: Not Currently    Comment: rare   Drug use: Not Currently  Types: Marijuana   Sexual activity: Yes    Birth control/protection: None  Other Topics Concern   Not on file  Social History Narrative   Lives with her mother.   Right-handed.   Caffeine use: 1-2 cups per day.   Social Determinants of Health   Financial Resource Strain: Medium Risk (06/04/2022)   Overall Financial Resource Strain (CARDIA)    Difficulty of Paying Living Expenses: Somewhat hard  Food Insecurity: Food Insecurity Present (06/04/2022)   Hunger Vital Sign    Worried About Running Out of Food in the Last Year: Sometimes true    Ran Out of Food in the Last Year: Never true  Transportation Needs: No Transportation Needs (06/04/2022)   PRAPARE -  Administrator, Civil Service (Medical): No    Lack of Transportation (Non-Medical): No  Physical Activity: Unknown (06/04/2022)   Exercise Vital Sign    Days of Exercise per Week: 0 days    Minutes of Exercise per Session: Not on file  Stress: Stress Concern Present (06/04/2022)   [employer] of Occupational Health - Occupational Stress Questionnaire    Feeling of Stress : Rather much  Social Connections: Socially Isolated (06/04/2022)   Social Connection and Isolation Panel [NHANES]    Frequency of Communication with Friends and Family: More than three times a week    Frequency of Social Gatherings with Friends and Family: Patient declined    Attends Religious Services: Never    Database administrator or Organizations: No    Attends Engineer, structural: Not on file    Marital Status: Never married   Allergies  Allergen Reactions   Cat Hair Extract Other (See Comments)    Difficulty Breathing, Eye irritation   Yeast-Derived Drug Products Other (See Comments)    Per allergy test   Seasonal Ic [Octacosanol]     Pollen- Watery itchy eyes    Apple Rash   Latex Rash   Rice Rash   Tomato Rash and Other (See Comments)    Itchy throat (rash around mouth)   Wound Dressing Adhesive Rash   Family History  Problem Relation Age of Onset   Breast cancer Mother    Ovarian cysts Mother    Hypertension Mother    Diabetes Mother    Stroke Father    Diabetes Father    Cancer Father        bone marrow   Breast cancer Other    Colon cancer Neg Hx    Endometrial cancer Neg Hx    Pancreatic cancer Neg Hx    Prostate cancer Neg Hx     Current Outpatient Medications (Endocrine & Metabolic):    Dulaglutide (TRULICITY) 4.5 MG/0.5ML SOPN, Inject 4.5 mg as directed once a week.   metFORMIN (GLUCOPHAGE) 1000 MG tablet, Take 1 tablet (1,000 mg total) by mouth daily with breakfast.   levonorgestrel (MIRENA) 20 MCG/DAY IUD, To be placed in the GYN ONC office  Current  Outpatient Medications (Cardiovascular):    amLODipine (NORVASC) 5 MG tablet, TAKE 1 TABLET BY MOUTH EVERY MORNING   losartan-hydrochlorothiazide (HYZAAR) 100-25 MG tablet, TAKE 1 TABLET BY MOUTH EVERY MORNING   rosuvastatin (CRESTOR) 10 MG tablet, Take 1 tablet (10 mg total) by mouth in the morning.   spironolactone (ALDACTONE) 50 MG tablet, TAKE 1 TABLET BY MOUTH EVERY MORNING  Current Outpatient Medications (Respiratory):    fluticasone (FLONASE) 50 MCG/ACT nasal spray, Place 2 sprays into both nostrils daily.   levocetirizine (XYZAL)  5 MG tablet, TAKE 1 TABLET BY MOUTH EVERY DAY AS NEEDED FOR allergies (cough/drainage)  Current Outpatient Medications (Analgesics):    celecoxib (CELEBREX) 200 MG capsule, One to 2 tablets by mouth daily as needed for pain.   Current Outpatient Medications (Other):    blood glucose meter kit and supplies KIT, One touch glucometer.   Use up to four times daily as directed. (FOR E11.9).   glucose blood (ONETOUCH ULTRA) test strip, Check glucose 1-2 times daily   Lancets (ONETOUCH DELICA PLUS LANCET33G) MISC, UAD to check sugars 1-2 times a day   Multiple Vitamin (MULTIVITAMIN) capsule, Take 1 capsule by mouth daily.   PARoxetine (PAXIL) 30 MG tablet, TAKE 1 TABLET BY MOUTH EVERY MORNING   Reviewed prior external information including notes and imaging from  primary care provider As well as notes that were available from care everywhere and other healthcare systems.  Past medical history, social, surgical and family history all reviewed in electronic medical record.  No pertanent information unless stated regarding to the chief complaint.   Review of Systems:  No headache, visual changes, nausea, vomiting, diarrhea, constipation, dizziness, abdominal pain, skin rash, fevers, chills, night sweats, weight loss, swollen lymph nodes, body aches, joint swelling, chest pain, shortness of breath, mood changes. POSITIVE muscle aches  Objective  Blood pressure  110/78, pulse 77, height 5\' 3"  (1.6 m), weight 281 lb (127.5 kg), SpO2 99%.   General: No apparent distress alert and oriented x3 mood and affect normal, dressed appropriately.  HEENT: Pupils equal, extraocular movements intact  Respiratory: Patient's speak in full sentences and does not appear short of breath  Cardiovascular: No lower extremity edema, non tender, no erythema  Knees do have Lacks the last 10 degrees of flexion.  Trace effusion noted.  Crepitus noted positive patellar grind.  Tender to palpation over the medial joint line relatively normal gait at the moment.  Abnormal thigh to calf ratio still noted    Impression and Recommendations:    The above documentation has been reviewed and is accurate and complete Judi Saa, DO

## 2023-01-06 ENCOUNTER — Telehealth: Payer: Self-pay | Admitting: Licensed Clinical Social Worker

## 2023-01-06 ENCOUNTER — Encounter: Payer: Self-pay | Admitting: Family Medicine

## 2023-01-06 ENCOUNTER — Ambulatory Visit (INDEPENDENT_AMBULATORY_CARE_PROVIDER_SITE_OTHER): Payer: 59 | Admitting: Family Medicine

## 2023-01-06 VITALS — BP 110/78 | HR 77 | Ht 63.0 in | Wt 281.0 lb

## 2023-01-06 DIAGNOSIS — M170 Bilateral primary osteoarthritis of knee: Secondary | ICD-10-CM | POA: Diagnosis not present

## 2023-01-06 DIAGNOSIS — N921 Excessive and frequent menstruation with irregular cycle: Secondary | ICD-10-CM | POA: Diagnosis not present

## 2023-01-06 DIAGNOSIS — N760 Acute vaginitis: Secondary | ICD-10-CM | POA: Diagnosis not present

## 2023-01-06 DIAGNOSIS — Z113 Encounter for screening for infections with a predominantly sexual mode of transmission: Secondary | ICD-10-CM | POA: Diagnosis not present

## 2023-01-06 DIAGNOSIS — Z1231 Encounter for screening mammogram for malignant neoplasm of breast: Secondary | ICD-10-CM | POA: Diagnosis not present

## 2023-01-06 DIAGNOSIS — N898 Other specified noninflammatory disorders of vagina: Secondary | ICD-10-CM | POA: Diagnosis not present

## 2023-01-06 DIAGNOSIS — N941 Unspecified dyspareunia: Secondary | ICD-10-CM | POA: Diagnosis not present

## 2023-01-06 DIAGNOSIS — Z130 Encounter for screening for diseases of the blood and blood-forming organs and certain disorders involving the immune mechanism: Secondary | ICD-10-CM | POA: Diagnosis not present

## 2023-01-06 DIAGNOSIS — Z1389 Encounter for screening for other disorder: Secondary | ICD-10-CM | POA: Diagnosis not present

## 2023-01-06 DIAGNOSIS — Z01411 Encounter for gynecological examination (general) (routine) with abnormal findings: Secondary | ICD-10-CM | POA: Diagnosis not present

## 2023-01-06 DIAGNOSIS — K6289 Other specified diseases of anus and rectum: Secondary | ICD-10-CM | POA: Diagnosis not present

## 2023-01-06 MED ORDER — CELECOXIB 200 MG PO CAPS: ORAL_CAPSULE | ORAL | 2 refills | Status: DC

## 2023-01-06 NOTE — Patient Instructions (Signed)
Will get you in to PT at [facility] Sent in celebrex 200mg  to take 2 times a day as needed Stop the meloxicam  See me again in 100-12 weeks Happy holidays!

## 2023-01-06 NOTE — Assessment & Plan Note (Signed)
Severe overall.  Discussed icing regimen and home exercises.  Discussed crepitus.  Discussed with patient has been relatively noncompliant so we will get patient into formal physical therapy to hold her more accountable.  Patient is motivated just finding it difficult to find the regular activities.  Follow-up with me again 2 to 3 months.

## 2023-01-06 NOTE — Telephone Encounter (Signed)
[facility] Clinical Social Work  Visual merchandiser received TC from pt inquiring about counseling. CSW reviewed counseling at [facility] and option for referral to community depending on needs. Pt opted for information on community therapists who can address concerns other than cancer dx. CSW sent options via e-mail as requested.     Darlene [name] E Brissa Asante, LCSW  Clinical Social Worker [facility]

## 2023-01-07 ENCOUNTER — Other Ambulatory Visit: Payer: Self-pay

## 2023-01-07 ENCOUNTER — Encounter: Payer: Self-pay | Admitting: Physical Therapy

## 2023-01-07 ENCOUNTER — Ambulatory Visit: Payer: 59 | Attending: Family Medicine | Admitting: Physical Therapy

## 2023-01-07 DIAGNOSIS — M25661 Stiffness of right knee, not elsewhere classified: Secondary | ICD-10-CM | POA: Insufficient documentation

## 2023-01-07 DIAGNOSIS — M25561 Pain in right knee: Secondary | ICD-10-CM | POA: Diagnosis not present

## 2023-01-07 DIAGNOSIS — G8929 Other chronic pain: Secondary | ICD-10-CM | POA: Insufficient documentation

## 2023-01-07 DIAGNOSIS — R262 Difficulty in walking, not elsewhere classified: Secondary | ICD-10-CM | POA: Insufficient documentation

## 2023-01-07 DIAGNOSIS — M25562 Pain in left knee: Secondary | ICD-10-CM | POA: Insufficient documentation

## 2023-01-07 DIAGNOSIS — M25662 Stiffness of left knee, not elsewhere classified: Secondary | ICD-10-CM | POA: Diagnosis not present

## 2023-01-07 DIAGNOSIS — M170 Bilateral primary osteoarthritis of knee: Secondary | ICD-10-CM | POA: Diagnosis not present

## 2023-01-07 DIAGNOSIS — M6281 Muscle weakness (generalized): Secondary | ICD-10-CM | POA: Insufficient documentation
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
## 2023-01-07 NOTE — Patient Instructions (Signed)
     [facility] Welcome to [facility]! Here you will find all the information you will need regarding your pool therapy. If you have further questions at any time, please call our office at [phone]. After completing your initial evaluation in the [facility], you may be eligible to complete a portion of your therapy in the pool. A typical week of therapy will consist of 1-2 typical physical therapy visits at our [facility] location and an additional session of therapy in the pool located at the [facility] at [address]. [address], [place] [address]. The phone number at the pool site is [phone]. Please call this number if you are running late or need to cancel your appointment.  Aquatic therapy will be offered on Wednesday mornings and Friday afternoons. Each session will last approximately 45 minutes. All scheduling and payments for aquatic therapy sessions, including cancelations, will be done through our [facility] location.  To be eligible for aquatic therapy, these criteria must be met: You must be able to independently change in the locker room and get to the pool deck. A caregiver can come with you to help if needed. There are benches for a caregiver to sit on next to the pool. No one with an open wound is permitted in the pool.  Handicap parking is available in the front and there is a drop off option for even closer accessibility. Please arrive 15 minutes prior to your appointment to prepare for your pool session. You must sign in at the front desk upon your arrival. Please be sure to attend to any toileting needs prior to entering the pool. Locker rooms for changing are available.  There is direct access to the pool deck from the locker room. You can lock your belongings in a locker or bring them with you poolside. Your therapist will greet you on the pool deck. There may be other swimmers in the pool at the  same time but your session is one-on-one with the therapist.   

## 2023-01-13 ENCOUNTER — Encounter: Payer: Self-pay | Admitting: Physical Therapy

## 2023-01-13 ENCOUNTER — Ambulatory Visit: Payer: 59 | Admitting: Physical Therapy

## 2023-01-13 DIAGNOSIS — M25662 Stiffness of left knee, not elsewhere classified: Secondary | ICD-10-CM

## 2023-01-13 DIAGNOSIS — M25562 Pain in left knee: Secondary | ICD-10-CM | POA: Diagnosis not present

## 2023-01-13 DIAGNOSIS — M25561 Pain in right knee: Secondary | ICD-10-CM | POA: Diagnosis not present

## 2023-01-13 DIAGNOSIS — M170 Bilateral primary osteoarthritis of knee: Secondary | ICD-10-CM | POA: Diagnosis not present

## 2023-01-13 DIAGNOSIS — M25661 Stiffness of right knee, not elsewhere classified: Secondary | ICD-10-CM | POA: Diagnosis not present

## 2023-01-13 DIAGNOSIS — R262 Difficulty in walking, not elsewhere classified: Secondary | ICD-10-CM | POA: Diagnosis not present

## 2023-01-13 DIAGNOSIS — M6281 Muscle weakness (generalized): Secondary | ICD-10-CM

## 2023-01-13 DIAGNOSIS — G8929 Other chronic pain: Secondary | ICD-10-CM
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
## 2023-01-15 ENCOUNTER — Ambulatory Visit: Payer: 59 | Admitting: Physical Therapy

## 2023-01-20 ENCOUNTER — Ambulatory Visit: Payer: 59 | Admitting: Physical Therapy

## 2023-01-20 ENCOUNTER — Encounter: Payer: Self-pay | Admitting: Physical Therapy

## 2023-01-20 DIAGNOSIS — M25662 Stiffness of left knee, not elsewhere classified: Secondary | ICD-10-CM

## 2023-01-20 DIAGNOSIS — G8929 Other chronic pain: Secondary | ICD-10-CM | POA: Diagnosis not present

## 2023-01-20 DIAGNOSIS — R262 Difficulty in walking, not elsewhere classified: Secondary | ICD-10-CM | POA: Diagnosis not present

## 2023-01-20 DIAGNOSIS — M25561 Pain in right knee: Secondary | ICD-10-CM | POA: Diagnosis not present

## 2023-01-20 DIAGNOSIS — R279 Unspecified lack of coordination: Secondary | ICD-10-CM

## 2023-01-20 DIAGNOSIS — M25661 Stiffness of right knee, not elsewhere classified: Secondary | ICD-10-CM

## 2023-01-20 DIAGNOSIS — M6281 Muscle weakness (generalized): Secondary | ICD-10-CM

## 2023-01-20 DIAGNOSIS — R293 Abnormal posture: Secondary | ICD-10-CM

## 2023-01-20 DIAGNOSIS — M25562 Pain in left knee: Secondary | ICD-10-CM | POA: Diagnosis not present

## 2023-01-20 DIAGNOSIS — M170 Bilateral primary osteoarthritis of knee: Secondary | ICD-10-CM | POA: Diagnosis not present
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
## 2023-01-22 ENCOUNTER — Ambulatory Visit: Payer: 59 | Admitting: Physical Therapy

## 2023-01-22 DIAGNOSIS — G8929 Other chronic pain: Secondary | ICD-10-CM

## 2023-01-22 DIAGNOSIS — R262 Difficulty in walking, not elsewhere classified: Secondary | ICD-10-CM | POA: Diagnosis not present

## 2023-01-22 DIAGNOSIS — M170 Bilateral primary osteoarthritis of knee: Secondary | ICD-10-CM | POA: Diagnosis not present

## 2023-01-22 DIAGNOSIS — M25661 Stiffness of right knee, not elsewhere classified: Secondary | ICD-10-CM | POA: Diagnosis not present

## 2023-01-22 DIAGNOSIS — M25562 Pain in left knee: Secondary | ICD-10-CM | POA: Diagnosis not present

## 2023-01-22 DIAGNOSIS — M6281 Muscle weakness (generalized): Secondary | ICD-10-CM

## 2023-01-22 DIAGNOSIS — M25662 Stiffness of left knee, not elsewhere classified: Secondary | ICD-10-CM | POA: Diagnosis not present

## 2023-01-22 DIAGNOSIS — M25561 Pain in right knee: Secondary | ICD-10-CM | POA: Diagnosis not present
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
## 2023-01-25 ENCOUNTER — Other Ambulatory Visit: Payer: Self-pay | Admitting: Internal Medicine
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
## 2023-01-27 ENCOUNTER — Encounter: Payer: Self-pay | Admitting: Physical Therapy

## 2023-01-27 ENCOUNTER — Ambulatory Visit: Payer: 59 | Admitting: Physical Therapy

## 2023-01-27 DIAGNOSIS — M170 Bilateral primary osteoarthritis of knee: Secondary | ICD-10-CM | POA: Diagnosis not present

## 2023-01-27 DIAGNOSIS — M25561 Pain in right knee: Secondary | ICD-10-CM | POA: Diagnosis not present

## 2023-01-27 DIAGNOSIS — M25662 Stiffness of left knee, not elsewhere classified: Secondary | ICD-10-CM

## 2023-01-27 DIAGNOSIS — G8929 Other chronic pain: Secondary | ICD-10-CM | POA: Diagnosis not present

## 2023-01-27 DIAGNOSIS — R279 Unspecified lack of coordination: Secondary | ICD-10-CM

## 2023-01-27 DIAGNOSIS — M6281 Muscle weakness (generalized): Secondary | ICD-10-CM | POA: Diagnosis not present

## 2023-01-27 DIAGNOSIS — M25562 Pain in left knee: Secondary | ICD-10-CM | POA: Diagnosis not present

## 2023-01-27 DIAGNOSIS — R262 Difficulty in walking, not elsewhere classified: Secondary | ICD-10-CM

## 2023-01-27 DIAGNOSIS — M25661 Stiffness of right knee, not elsewhere classified: Secondary | ICD-10-CM | POA: Diagnosis not present

## 2023-01-27 DIAGNOSIS — R293 Abnormal posture: Secondary | ICD-10-CM

## 2023-02-01 ENCOUNTER — Other Ambulatory Visit: Payer: Self-pay | Admitting: Internal Medicine

## 2023-02-01 ENCOUNTER — Telehealth: Payer: Self-pay | Admitting: *Deleted

## 2023-02-01 ENCOUNTER — Telehealth: Payer: Self-pay | Admitting: Internal Medicine

## 2023-02-01 DIAGNOSIS — E119 Type 2 diabetes mellitus without complications: Secondary | ICD-10-CM

## 2023-02-01 NOTE — Telephone Encounter (Signed)
Spoke with patient who called the office to reschedule her appt. With Dr. Pricilla Holm on 12/26. Pt was given a new appointment date of 12/23 at 2 pm. Pt agreed to date and time, no further concerns or questions at this time.

## 2023-02-01 NOTE — Telephone Encounter (Signed)
Prescription Request  02/01/2023  LOV: 12/11/2022  What is the name of the medication or equipment?  Dulaglutide (TRULICITY) 4.5 MG/0.5ML SOPN [   Have you contacted your pharmacy to request a refill? No   Which pharmacy would you like this sent to?  [facility] - [place], [place] - [address] [place] [address] Phone: [phone] Fax: [phone]    Patient notified that their request is being sent to the clinical staff for review and that they should receive a response within 2 business days.   Please advise at Mobile [phone] (mobile)

## 2023-02-01 NOTE — Telephone Encounter (Signed)
Sent in today 
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
## 2023-02-03 ENCOUNTER — Encounter: Payer: Self-pay | Admitting: Physical Therapy

## 2023-02-03 ENCOUNTER — Ambulatory Visit: Payer: 59 | Attending: Family Medicine | Admitting: Physical Therapy

## 2023-02-03 DIAGNOSIS — M25662 Stiffness of left knee, not elsewhere classified: Secondary | ICD-10-CM | POA: Diagnosis not present

## 2023-02-03 DIAGNOSIS — M25661 Stiffness of right knee, not elsewhere classified: Secondary | ICD-10-CM | POA: Diagnosis not present

## 2023-02-03 DIAGNOSIS — R279 Unspecified lack of coordination: Secondary | ICD-10-CM | POA: Diagnosis not present

## 2023-02-03 DIAGNOSIS — M6281 Muscle weakness (generalized): Secondary | ICD-10-CM | POA: Diagnosis not present

## 2023-02-03 DIAGNOSIS — R293 Abnormal posture: Secondary | ICD-10-CM | POA: Insufficient documentation

## 2023-02-03 DIAGNOSIS — M25562 Pain in left knee: Secondary | ICD-10-CM | POA: Diagnosis not present

## 2023-02-03 DIAGNOSIS — R262 Difficulty in walking, not elsewhere classified: Secondary | ICD-10-CM | POA: Insufficient documentation

## 2023-02-03 DIAGNOSIS — G8929 Other chronic pain: Secondary | ICD-10-CM | POA: Insufficient documentation

## 2023-02-03 DIAGNOSIS — M25561 Pain in right knee: Secondary | ICD-10-CM | POA: Diagnosis not present

## 2023-02-04 DIAGNOSIS — F332 Major depressive disorder, recurrent severe without psychotic features: Secondary | ICD-10-CM | POA: Diagnosis not present

## 2023-02-05 ENCOUNTER — Ambulatory Visit: Payer: 59 | Admitting: Physical Therapy

## 2023-02-05 DIAGNOSIS — R293 Abnormal posture: Secondary | ICD-10-CM | POA: Diagnosis not present

## 2023-02-05 DIAGNOSIS — M6281 Muscle weakness (generalized): Secondary | ICD-10-CM | POA: Diagnosis not present

## 2023-02-05 DIAGNOSIS — M25561 Pain in right knee: Secondary | ICD-10-CM | POA: Diagnosis not present

## 2023-02-05 DIAGNOSIS — M25661 Stiffness of right knee, not elsewhere classified: Secondary | ICD-10-CM | POA: Diagnosis not present

## 2023-02-05 DIAGNOSIS — R262 Difficulty in walking, not elsewhere classified: Secondary | ICD-10-CM | POA: Diagnosis not present

## 2023-02-05 DIAGNOSIS — R279 Unspecified lack of coordination: Secondary | ICD-10-CM | POA: Diagnosis not present

## 2023-02-05 DIAGNOSIS — M25662 Stiffness of left knee, not elsewhere classified: Secondary | ICD-10-CM | POA: Diagnosis not present

## 2023-02-05 DIAGNOSIS — G8929 Other chronic pain: Secondary | ICD-10-CM | POA: Diagnosis not present

## 2023-02-05 DIAGNOSIS — M25562 Pain in left knee: Secondary | ICD-10-CM | POA: Diagnosis not present
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
## 2023-02-09 DIAGNOSIS — F332 Major depressive disorder, recurrent severe without psychotic features: Secondary | ICD-10-CM | POA: Diagnosis not present

## 2023-02-09 DIAGNOSIS — F4310 Post-traumatic stress disorder, unspecified: Secondary | ICD-10-CM | POA: Diagnosis not present
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
## 2023-02-10 ENCOUNTER — Ambulatory Visit: Payer: 59 | Admitting: Physical Therapy

## 2023-02-10 ENCOUNTER — Encounter: Payer: Self-pay | Admitting: Physical Therapy

## 2023-02-10 DIAGNOSIS — R262 Difficulty in walking, not elsewhere classified: Secondary | ICD-10-CM

## 2023-02-10 DIAGNOSIS — G8929 Other chronic pain: Secondary | ICD-10-CM

## 2023-02-10 DIAGNOSIS — R293 Abnormal posture: Secondary | ICD-10-CM

## 2023-02-10 DIAGNOSIS — M6281 Muscle weakness (generalized): Secondary | ICD-10-CM

## 2023-02-10 DIAGNOSIS — M25662 Stiffness of left knee, not elsewhere classified: Secondary | ICD-10-CM

## 2023-02-10 DIAGNOSIS — M25661 Stiffness of right knee, not elsewhere classified: Secondary | ICD-10-CM

## 2023-02-10 DIAGNOSIS — R279 Unspecified lack of coordination: Secondary | ICD-10-CM

## 2023-02-12 ENCOUNTER — Ambulatory Visit: Payer: 59 | Admitting: Physical Therapy

## 2023-02-12 DIAGNOSIS — M25562 Pain in left knee: Secondary | ICD-10-CM | POA: Diagnosis not present

## 2023-02-12 DIAGNOSIS — M25561 Pain in right knee: Secondary | ICD-10-CM | POA: Diagnosis not present

## 2023-02-12 DIAGNOSIS — M25661 Stiffness of right knee, not elsewhere classified: Secondary | ICD-10-CM

## 2023-02-12 DIAGNOSIS — M6281 Muscle weakness (generalized): Secondary | ICD-10-CM

## 2023-02-12 DIAGNOSIS — M25662 Stiffness of left knee, not elsewhere classified: Secondary | ICD-10-CM

## 2023-02-12 DIAGNOSIS — G8929 Other chronic pain: Secondary | ICD-10-CM | POA: Diagnosis not present

## 2023-02-12 DIAGNOSIS — R293 Abnormal posture: Secondary | ICD-10-CM | POA: Diagnosis not present

## 2023-02-12 DIAGNOSIS — R262 Difficulty in walking, not elsewhere classified: Secondary | ICD-10-CM | POA: Diagnosis not present

## 2023-02-12 DIAGNOSIS — R279 Unspecified lack of coordination: Secondary | ICD-10-CM | POA: Diagnosis not present
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
## 2023-02-15 ENCOUNTER — Other Ambulatory Visit: Payer: Self-pay | Admitting: Internal Medicine
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
## 2023-02-17 ENCOUNTER — Telehealth: Payer: Self-pay

## 2023-02-17 ENCOUNTER — Encounter: Payer: Self-pay | Admitting: Physical Therapy

## 2023-02-17 ENCOUNTER — Ambulatory Visit: Payer: 59 | Admitting: Physical Therapy

## 2023-02-17 DIAGNOSIS — M25562 Pain in left knee: Secondary | ICD-10-CM | POA: Diagnosis not present

## 2023-02-17 DIAGNOSIS — M25662 Stiffness of left knee, not elsewhere classified: Secondary | ICD-10-CM

## 2023-02-17 DIAGNOSIS — R279 Unspecified lack of coordination: Secondary | ICD-10-CM

## 2023-02-17 DIAGNOSIS — R262 Difficulty in walking, not elsewhere classified: Secondary | ICD-10-CM | POA: Diagnosis not present

## 2023-02-17 DIAGNOSIS — R293 Abnormal posture: Secondary | ICD-10-CM | POA: Diagnosis not present

## 2023-02-17 DIAGNOSIS — M6281 Muscle weakness (generalized): Secondary | ICD-10-CM

## 2023-02-17 DIAGNOSIS — G8929 Other chronic pain: Secondary | ICD-10-CM

## 2023-02-17 DIAGNOSIS — M25561 Pain in right knee: Secondary | ICD-10-CM | POA: Diagnosis not present

## 2023-02-17 DIAGNOSIS — M25661 Stiffness of right knee, not elsewhere classified: Secondary | ICD-10-CM | POA: Diagnosis not present

## 2023-02-17 NOTE — Telephone Encounter (Signed)
Patient came in today to weigh.  She weighed 267.7 lbs today.

## 2023-02-18 LAB — MOLECULAR PATHOLOGY

## 2023-02-19 ENCOUNTER — Ambulatory Visit: Payer: 59 | Admitting: Physical Therapy

## 2023-02-19 DIAGNOSIS — R279 Unspecified lack of coordination: Secondary | ICD-10-CM | POA: Diagnosis not present

## 2023-02-19 DIAGNOSIS — F332 Major depressive disorder, recurrent severe without psychotic features: Secondary | ICD-10-CM | POA: Diagnosis not present

## 2023-02-19 DIAGNOSIS — M25561 Pain in right knee: Secondary | ICD-10-CM | POA: Diagnosis not present

## 2023-02-19 DIAGNOSIS — M6281 Muscle weakness (generalized): Secondary | ICD-10-CM

## 2023-02-19 DIAGNOSIS — F4310 Post-traumatic stress disorder, unspecified: Secondary | ICD-10-CM | POA: Diagnosis not present

## 2023-02-19 DIAGNOSIS — R293 Abnormal posture: Secondary | ICD-10-CM | POA: Diagnosis not present

## 2023-02-19 DIAGNOSIS — M25661 Stiffness of right knee, not elsewhere classified: Secondary | ICD-10-CM | POA: Diagnosis not present

## 2023-02-19 DIAGNOSIS — R262 Difficulty in walking, not elsewhere classified: Secondary | ICD-10-CM | POA: Diagnosis not present

## 2023-02-19 DIAGNOSIS — M25662 Stiffness of left knee, not elsewhere classified: Secondary | ICD-10-CM | POA: Diagnosis not present

## 2023-02-19 DIAGNOSIS — M25562 Pain in left knee: Secondary | ICD-10-CM | POA: Diagnosis not present

## 2023-02-19 DIAGNOSIS — G8929 Other chronic pain: Secondary | ICD-10-CM | POA: Diagnosis not present
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
## 2023-02-25 ENCOUNTER — Ambulatory Visit: Payer: 59 | Admitting: Gynecologic Oncology

## 2023-02-26 ENCOUNTER — Encounter: Payer: 59 | Admitting: Rehabilitative and Restorative Service Providers"

## 2023-03-04 ENCOUNTER — Encounter: Payer: 59 | Admitting: Physical Therapy

## 2023-03-10 ENCOUNTER — Ambulatory Visit: Payer: 59 | Attending: Family Medicine | Admitting: Physical Therapy

## 2023-03-10 ENCOUNTER — Telehealth: Payer: Self-pay | Admitting: Physical Therapy

## 2023-03-10 DIAGNOSIS — M25562 Pain in left knee: Secondary | ICD-10-CM | POA: Insufficient documentation

## 2023-03-10 DIAGNOSIS — M25561 Pain in right knee: Secondary | ICD-10-CM | POA: Insufficient documentation

## 2023-03-10 DIAGNOSIS — R279 Unspecified lack of coordination: Secondary | ICD-10-CM | POA: Insufficient documentation

## 2023-03-10 DIAGNOSIS — M25662 Stiffness of left knee, not elsewhere classified: Secondary | ICD-10-CM | POA: Insufficient documentation

## 2023-03-10 DIAGNOSIS — M25661 Stiffness of right knee, not elsewhere classified: Secondary | ICD-10-CM | POA: Insufficient documentation

## 2023-03-10 DIAGNOSIS — R262 Difficulty in walking, not elsewhere classified: Secondary | ICD-10-CM | POA: Insufficient documentation

## 2023-03-10 DIAGNOSIS — G8929 Other chronic pain: Secondary | ICD-10-CM | POA: Insufficient documentation

## 2023-03-10 DIAGNOSIS — M6281 Muscle weakness (generalized): Secondary | ICD-10-CM | POA: Insufficient documentation

## 2023-03-10 DIAGNOSIS — R293 Abnormal posture: Secondary | ICD-10-CM | POA: Insufficient documentation

## 2023-03-10 NOTE — Telephone Encounter (Signed)
 Called and left message on voicemail regarding no-show for appt today at 11:00

## 2023-03-11 DIAGNOSIS — F4310 Post-traumatic stress disorder, unspecified: Secondary | ICD-10-CM | POA: Diagnosis not present

## 2023-03-11 DIAGNOSIS — F332 Major depressive disorder, recurrent severe without psychotic features: Secondary | ICD-10-CM | POA: Diagnosis not present

## 2023-03-18 DIAGNOSIS — F4310 Post-traumatic stress disorder, unspecified: Secondary | ICD-10-CM | POA: Diagnosis not present

## 2023-03-18 DIAGNOSIS — F332 Major depressive disorder, recurrent severe without psychotic features: Secondary | ICD-10-CM | POA: Diagnosis not present

## 2023-03-19 NOTE — Progress Notes (Deleted)
[name] [address] [place] [address] Phone: [phone] Subjective:    I'm seeing this patient by the request  of:  Pincus Sanes, MD  CC:   [id number]  01/06/2023 Severe overall.  Discussed icing regimen and home exercises.  Discussed crepitus.  Discussed with patient has been relatively noncompliant so we will get patient into formal physical therapy to hold her more accountable.  Patient is motivated just finding it difficult to find the regular activities.  Follow-up with me again 2 to 3 months.     Updated 03/24/2023 Darlene [name] is a 48 y.o. female coming in with complaint of B knee pain      Past Medical History:  Diagnosis Date   Arthritis    hands, low back   Depression    Environmental and seasonal allergies    Gallstones    05-05-2022  per pt no issues currently   Genital herpes    GERD (gastroesophageal reflux disease)    05-05-2022  per pt watches diet , not meds   Hyperlipidemia    Hypertension    Insomnia    Menorrhagia    Mixed incontinence urge and stress    Morbid obesity (HCC)    OSA (obstructive sleep apnea) 2011   (05-05-2022  per pt tried  cpap but intolerent and did not follow up)  first dx 2011--- last sleep study done by dr dohmeier 01-17-2020 moderate to severe osa,  cpap on back order ,  finally received 04/ 2022   Type 2 diabetes mellitus (HCC)    followed by pcp;   (05-05-2022  per pt check blood sugar 3-4 times daily,  fasting average blood sugar 108-115)   Uterine leiomyoma    Vitiligo    Wears contact lenses    Past Surgical History:  Procedure Laterality Date   COLONOSCOPY WITH PROPOFOL N/A 09/20/2020   Procedure: COLONOSCOPY WITH PROPOFOL;  Surgeon: Jeani Hawking, MD;  Location: [facility];  Service: Endoscopy;  Laterality: N/A;   DILATATION & CURETTAGE/HYSTEROSCOPY WITH MYOSURE N/A 05/07/2022   Procedure: DILATATION & CURETTAGE/HYSTEROSCOPY WITH MYOSURE;  Surgeon: Edwinna Areola, DO;  Location: [facility];  Service: Gynecology;  Laterality: N/A;   WISDOM TOOTH EXTRACTION     Social History   Socioeconomic History   Marital status: Single    Spouse name: Not on file   Number of children: 0   Years of education: some college   Highest education level: Associate degree: occupational, Scientist, product/process development, or vocational program  Occupational History   Occupation: customer service  Tobacco Use   Smoking status: Never   Smokeless tobacco: Never  Vaping Use   Vaping status: Former   Substances: THC  Substance and Sexual Activity   Alcohol use: Not Currently    Comment: rare   Drug use: Not Currently    Types: Marijuana   Sexual activity: Yes    Birth control/protection: None  Other Topics Concern   Not on file  Social History Narrative   Lives with her mother.   Right-handed.   Caffeine use: 1-2 cups per day.   Social Drivers of Health   Financial Resource Strain: Medium Risk (06/04/2022)   Overall Financial Resource Strain (CARDIA)    Difficulty of Paying Living Expenses: Somewhat hard  Food Insecurity: Food Insecurity Present (06/04/2022)   Hunger Vital Sign    Worried About Running Out of Food in the Last Year: Sometimes true    Ran  Out of Food in the Last Year: Never true  Transportation Needs: No Transportation Needs (06/04/2022)   PRAPARE - Administrator, Civil Service (Medical): No    Lack of Transportation (Non-Medical): No  Physical Activity: Unknown (06/04/2022)   Exercise Vital Sign    Days of Exercise per Week: 0 days    Minutes of Exercise per Session: Not on file  Stress: Stress Concern Present (06/04/2022)   [employer] of Occupational Health - Occupational Stress Questionnaire    Feeling of Stress : Rather much  Social Connections: Socially Isolated (06/04/2022)   Social Connection and Isolation Panel [NHANES]    Frequency of Communication with Friends and Family: More than three times a week    Frequency of  Social Gatherings with Friends and Family: Patient declined    Attends Religious Services: Never    Database administrator or Organizations: No    Attends Engineer, structural: Not on file    Marital Status: Never married   Allergies  Allergen Reactions   Cat Dander Other (See Comments)    Difficulty Breathing, Eye irritation   Yeast-Derived Drug Products Other (See Comments)    Per allergy test   Seasonal Ic [Octacosanol]     Pollen- Watery itchy eyes    Apple Rash   Latex Rash   Rice Rash   Tomato Rash and Other (See Comments)    Itchy throat (rash around mouth)   Wound Dressing Adhesive Rash   Family History  Problem Relation Age of Onset   Breast cancer Mother    Ovarian cysts Mother    Hypertension Mother    Diabetes Mother    Stroke Father    Diabetes Father    Cancer Father        bone marrow   Breast cancer Other    Colon cancer Neg Hx    Endometrial cancer Neg Hx    Pancreatic cancer Neg Hx    Prostate cancer Neg Hx     Current Outpatient Medications (Endocrine & Metabolic):    levonorgestrel (MIRENA) 20 MCG/DAY IUD, To be placed in the GYN ONC office   metFORMIN (GLUCOPHAGE) 1000 MG tablet, TAKE 1 TABLET BY MOUTH 2 TIMES DAILY - IN THE MORNING & AT BEDTIME   TRULICITY 4.5 MG/0.5ML SOAJ, inject 4.5mg  into THE SKIN ONCE WEEKLY  Current Outpatient Medications (Cardiovascular):    amLODipine (NORVASC) 5 MG tablet, TAKE 1 TABLET BY MOUTH EVERY MORNING   losartan-hydrochlorothiazide (HYZAAR) 100-25 MG tablet, TAKE 1 TABLET BY MOUTH EVERY MORNING   rosuvastatin (CRESTOR) 10 MG tablet, Take 1 tablet (10 mg total) by mouth in the morning.   spironolactone (ALDACTONE) 50 MG tablet, TAKE 1 TABLET BY MOUTH EVERY MORNING  Current Outpatient Medications (Respiratory):    fluticasone (FLONASE) 50 MCG/ACT nasal spray, Place 2 sprays into both nostrils daily.   levocetirizine (XYZAL) 5 MG tablet, TAKE 1 TABLET BY MOUTH EVERY DAY AS NEEDED FOR allergies  (cough/drainage)  Current Outpatient Medications (Analgesics):    celecoxib (CELEBREX) 200 MG capsule, One to 2 tablets by mouth daily as needed for pain.   Current Outpatient Medications (Other):    ACCU-CHEK GUIDE TEST test strip, Check glucose 1-2 times daily   blood glucose meter kit and supplies KIT, One touch glucometer.   Use up to four times daily as directed. (FOR E11.9).   Lancets (ONETOUCH DELICA PLUS LANCET33G) MISC, USE AS DIRECTED TO check sugars 1-2 TIMES A DAY -will last 50 DAYS  Multiple Vitamin (MULTIVITAMIN) capsule, Take 1 capsule by mouth daily.   PARoxetine (PAXIL) 30 MG tablet, TAKE 1 TABLET BY MOUTH EVERY MORNING   Reviewed prior external information including notes and imaging from  primary care provider As well as notes that were available from care everywhere and other healthcare systems.  Past medical history, social, surgical and family history all reviewed in electronic medical record.  No pertanent information unless stated regarding to the chief complaint.   Review of Systems:  No headache, visual changes, nausea, vomiting, diarrhea, constipation, dizziness, abdominal pain, skin rash, fevers, chills, night sweats, weight loss, swollen lymph nodes, body aches, joint swelling, chest pain, shortness of breath, mood changes. POSITIVE muscle aches  Objective  There were no vitals taken for this visit.   General: No apparent distress alert and oriented x3 mood and affect normal, dressed appropriately.  HEENT: Pupils equal, extraocular movements intact  Respiratory: Patient's speak in full sentences and does not appear short of breath  Cardiovascular: No lower extremity edema, non tender, no erythema      Impression and Recommendations:

## 2023-03-22 ENCOUNTER — Other Ambulatory Visit: Payer: Self-pay | Admitting: Family Medicine

## 2023-03-23 ENCOUNTER — Other Ambulatory Visit: Payer: Self-pay

## 2023-03-23 MED ORDER — CELECOXIB 200 MG PO CAPS: ORAL_CAPSULE | ORAL | 0 refills | Status: DC

## 2023-03-24 ENCOUNTER — Ambulatory Visit: Payer: 59 | Admitting: Family Medicine

## 2023-03-24 ENCOUNTER — Telehealth: Payer: Self-pay

## 2023-03-24 NOTE — Telephone Encounter (Signed)
Darlene [name] called office stating she is sick and needs to reschedule her appointment for tomorrow with Dr.Tucker.   Next available with Dr.Tucker was not until May. Pt is ok with seeing Dr.Jackson-Moore. Appointment scheduled for 2/12 @ 1:00 with Dr.Jackson Christell Constant. Pt agreed to date and time

## 2023-03-25 ENCOUNTER — Inpatient Hospital Stay: Payer: Self-pay | Admitting: Gynecologic Oncology

## 2023-03-25 DIAGNOSIS — C541 Malignant neoplasm of endometrium: Secondary | ICD-10-CM

## 2023-03-30 DIAGNOSIS — F4310 Post-traumatic stress disorder, unspecified: Secondary | ICD-10-CM | POA: Diagnosis not present

## 2023-03-30 DIAGNOSIS — F332 Major depressive disorder, recurrent severe without psychotic features: Secondary | ICD-10-CM | POA: Diagnosis not present

## 2023-03-31 ENCOUNTER — Encounter: Payer: Self-pay | Admitting: Internal Medicine

## 2023-03-31 NOTE — Progress Notes (Unsigned)
    Subjective:    Patient ID: Darlene [name], female    DOB: [birth year], 48 y.o.   MRN: [id number]      HPI Darlene [name] is here for No chief complaint on file.        Medications and allergies reviewed with patient and updated if appropriate.  Current Outpatient Medications on File Prior to Visit  Medication Sig Dispense Refill   ACCU-CHEK GUIDE TEST test strip Check glucose 1-2 times daily 100 strip 0   amLODipine (NORVASC) 5 MG tablet TAKE 1 TABLET BY MOUTH EVERY MORNING 90 tablet 3   blood glucose meter kit and supplies KIT One touch glucometer.   Use up to four times daily as directed. (FOR E11.9). 1 each 0   celecoxib (CELEBREX) 200 MG capsule One to 2 tablets by mouth daily as needed for pain. 60 capsule 0   fluticasone (FLONASE) 50 MCG/ACT nasal spray Place 2 sprays into both nostrils daily. 16 g 6   Lancets (ONETOUCH DELICA PLUS LANCET33G) MISC USE AS DIRECTED TO check sugars 1-2 TIMES A DAY -will last 50 DAYS 100 each 0   levocetirizine (XYZAL) 5 MG tablet TAKE 1 TABLET BY MOUTH EVERY DAY AS NEEDED FOR allergies (cough/drainage) 90 tablet 3   levonorgestrel (MIRENA) 20 MCG/DAY IUD To be placed in the GYN ONC office 1 each 0   losartan-hydrochlorothiazide (HYZAAR) 100-25 MG tablet TAKE 1 TABLET BY MOUTH EVERY MORNING 90 tablet 3   metFORMIN (GLUCOPHAGE) 1000 MG tablet TAKE 1 TABLET BY MOUTH 2 TIMES DAILY - IN THE MORNING & AT BEDTIME 180 tablet 3   Multiple Vitamin (MULTIVITAMIN) capsule Take 1 capsule by mouth daily.     PARoxetine (PAXIL) 30 MG tablet TAKE 1 TABLET BY MOUTH EVERY MORNING 90 tablet 1   rosuvastatin (CRESTOR) 10 MG tablet Take 1 tablet (10 mg total) by mouth in the morning. 90 tablet 3   spironolactone (ALDACTONE) 50 MG tablet TAKE 1 TABLET BY MOUTH EVERY MORNING 90 tablet 3   TRULICITY 4.5 MG/0.5ML SOAJ inject 4.5mg  into THE SKIN ONCE WEEKLY 2 mL 3   No current facility-administered medications on file prior to visit.    Review of Systems      Objective:  There were no vitals filed for this visit. BP Readings from Last 3 Encounters:  01/06/23 110/78  12/11/22 134/78  11/26/22 126/82   Wt Readings from Last 3 Encounters:  01/06/23 281 lb (127.5 kg)  12/11/22 272 lb (123.4 kg)  11/26/22 275 lb (124.7 kg)   There is no height or weight on file to calculate BMI.    Physical Exam         Assessment & Plan:    See Problem List for Assessment and Plan of chronic medical problems.

## 2023-03-31 NOTE — Patient Instructions (Addendum)
      Medications changes include :   augmentin, cough syrup    A referral was ordered cardiologyand someone will call you to schedule an appointment.     Return for follow up as scheduled.

## 2023-04-01 ENCOUNTER — Ambulatory Visit (INDEPENDENT_AMBULATORY_CARE_PROVIDER_SITE_OTHER): Payer: 59 | Admitting: Internal Medicine

## 2023-04-01 VITALS — BP 128/84 | HR 75 | Temp 98.8°F | Ht 63.0 in | Wt 268.0 lb

## 2023-04-01 DIAGNOSIS — G4733 Obstructive sleep apnea (adult) (pediatric): Secondary | ICD-10-CM

## 2023-04-01 DIAGNOSIS — J069 Acute upper respiratory infection, unspecified: Secondary | ICD-10-CM | POA: Diagnosis not present

## 2023-04-01 DIAGNOSIS — Z136 Encounter for screening for cardiovascular disorders: Secondary | ICD-10-CM

## 2023-04-01 DIAGNOSIS — I10 Essential (primary) hypertension: Secondary | ICD-10-CM | POA: Diagnosis not present

## 2023-04-01 MED ORDER — HYDROCOD POLI-CHLORPHE POLI ER 10-8 MG/5ML PO SUER: 5.0000 mL | Freq: Two times a day (BID) | ORAL | 0 refills | Status: DC | PRN

## 2023-04-01 MED ORDER — AMOXICILLIN-POT CLAVULANATE 875-125 MG PO TABS: 1.0000 | ORAL_TABLET | Freq: Two times a day (BID) | ORAL | 0 refills | Status: AC

## 2023-04-01 NOTE — Assessment & Plan Note (Signed)
Chronic Not using CPAP-did not tolerate it Will discuss with cardiology

## 2023-04-01 NOTE — Assessment & Plan Note (Addendum)
Chronic Blood pressure controlled Continue weight loss efforts Continue amlodipine 5 mg daily, losartan-HCTZ 100-25 mg daily, spironolactone 50 mg daily

## 2023-04-01 NOTE — Assessment & Plan Note (Addendum)
Acute Symptoms consistent with URI-symptoms concerning for possible bacterial cause Will start Augmentin 8 75-1 25 twice daily x 7 days Tussionex cough syrup Over-the-counter cold medications for symptom relief Continue saline nasal spray

## 2023-04-05 ENCOUNTER — Ambulatory Visit: Payer: Self-pay | Admitting: Internal Medicine

## 2023-04-05 ENCOUNTER — Other Ambulatory Visit: Payer: Self-pay | Admitting: Internal Medicine

## 2023-04-05 NOTE — Telephone Encounter (Unsigned)
Copied from CRM [id number]. Topic: Clinical - Pink Word Triage >> Apr 05, 2023  2:25 PM Fredrich Romans wrote: Reason for Triage: patient was prescribed chlorpheniramine-HYDROcodone (TUSSIONEX) 10-8 MG/5ML for her cough.She called in today stating that it is not helping at all,and she has a reaction from it as well.She has a rash on her chest,neck and shoulder.As well as her inner thigh

## 2023-04-05 NOTE — Telephone Encounter (Signed)
Chief Complaint: cough, possible med reaction Symptoms: rash, cough Frequency: Saturday Pertinent Negatives: Patient denies SOB, chest pain, fever Disposition: [] ED /[] Urgent Care (no appt availability in office) / [] Appointment(In office/virtual)/ []  [facility]/ [x] Home Care/ [] Refused Recommended Disposition /[] [facility]/ []  Follow-up with PCP Additional Notes: Patient c/o possible rash from taking cough medicine prescribed by PCP. Reports that she was prescribed rx Thursday and took that night up until Saturday. She endorses that the medication made her go into coughing fits, which disturbed her sleep. She also endorsed noticing a rash on Saturday on neck/shoulders/chest. Triager advised to stop cough medicine since it was not providing relief and reviewed home remedies for cough. Triager also advised that medication question would be forwarded to PCP to F/U and to see if alternate cough can be prescribed.   Copied from CRM [id number]. Topic: Clinical - Medical Advice >> Apr 05, 2023  4:30 PM Elizebeth Brooking wrote: Reason for CRM: Patient called in regarding reaction to the medicine that was prescribed  chlorpheniramine-HYDROcodone (TUSSIONEX) 10-8 MG/5ML , stated she has not gotten a call back from the nurse, she is requesting for someone to  give her a call regarding this matter Reason for Disposition  [1] Caller has NON-URGENT medicine question about med that PCP prescribed AND [2] triager unable to answer question  Cough  Answer Assessment - Initial Assessment Questions 1. NAME of MEDICINE: "What medicine(s) are you calling about?"     chlorpheniramine-HYDROcodone (TUSSIONEX) 10-8 MG/5ML 2. QUESTION: "What is your question?" (e.g., double dose of medicine, side effect)     Took medication but made made her cough worse 3. PRESCRIBER: "Who prescribed the medicine?" Reason: if prescribed by specialist, call should be referred to that group.     PCP 4. SYMPTOMS: "Do you  have any symptoms?" If Yes, ask: "What symptoms are you having?"  "How bad are the symptoms (e.g., mild, moderate, severe)     Itching on neck/shoulders, rash on chest - noticed Saturday  Reports rash is a little bumps, "faint rash" like a "heat rash" Took med thurs - Saturday  Answer Assessment - Initial Assessment Questions 1. ONSET: "When did the cough begin?"      Going on 3rd week 2. SEVERITY: "How bad is the cough today?"      Getting better, but worse at night. Impacting sleep 3. SPUTUM: "Describe the color of your sputum" (none, dry cough; clear, white, yellow, green)     Clear, some yellow/green 4. HEMOPTYSIS: "Are you coughing up any blood?" If so ask: "How much?" (flecks, streaks, tablespoons, etc.)     no 5. DIFFICULTY BREATHING: "Are you having difficulty breathing?" If Yes, ask: "How bad is it?" (e.g., mild, moderate, severe)    - MILD: No SOB at rest, mild SOB with walking, speaks normally in sentences, can lie down, no retractions, pulse < 100.    - MODERATE: SOB at rest, SOB with minimal exertion and prefers to sit, cannot lie down flat, speaks in phrases, mild retractions, audible wheezing, pulse 100-120.    - SEVERE: Very SOB at rest, speaks in single words, struggling to breathe, sitting hunched forward, retractions, pulse > 120      no 6. FEVER: "Do you have a fever?" If Yes, ask: "What is your temperature, how was it measured, and when did it start?"     no 7. CARDIAC HISTORY: "Do you have any history of heart disease?" (e.g., heart attack, congestive heart failure)  no 8. LUNG HISTORY: "Do you have any history of lung disease?"  (e.g., pulmonary embolus, asthma, emphysema)     OSA 9. PE RISK FACTORS: "Do you have a history of blood clots?" (or: recent major surgery, recent prolonged travel, bedridden)     no 10. OTHER SYMPTOMS: "Do you have any other symptoms?" (e.g., runny nose, wheezing, chest pain)       no  Protocols used: Medication Question Call-A-AH,  Cough - Acute Non-Productive-A-AH

## 2023-04-06 MED ORDER — PROMETHAZINE-DM 6.25-15 MG/5ML PO SYRP: 5.0000 mL | ORAL_SOLUTION | Freq: Four times a day (QID) | ORAL | 0 refills | Status: DC | PRN

## 2023-04-06 NOTE — Addendum Note (Signed)
Addended by: Pincus Sanes on: 04/06/2023 07:50 PM   Modules accepted: Orders

## 2023-04-08 DIAGNOSIS — F4310 Post-traumatic stress disorder, unspecified: Secondary | ICD-10-CM | POA: Diagnosis not present

## 2023-04-08 DIAGNOSIS — F332 Major depressive disorder, recurrent severe without psychotic features: Secondary | ICD-10-CM | POA: Diagnosis not present

## 2023-04-12 ENCOUNTER — Other Ambulatory Visit: Payer: Self-pay | Admitting: Internal Medicine

## 2023-04-14 ENCOUNTER — Inpatient Hospital Stay: Payer: 59 | Attending: Obstetrics & Gynecology | Admitting: Obstetrics & Gynecology

## 2023-04-14 ENCOUNTER — Encounter: Payer: Self-pay | Admitting: Obstetrics & Gynecology

## 2023-04-14 VITALS — BP 110/77 | HR 64 | Temp 98.7°F | Resp 18 | Ht 63.0 in | Wt 271.0 lb

## 2023-04-14 DIAGNOSIS — C541 Malignant neoplasm of endometrium: Secondary | ICD-10-CM | POA: Diagnosis not present

## 2023-04-14 DIAGNOSIS — Z79890 Hormone replacement therapy (postmenopausal): Secondary | ICD-10-CM | POA: Insufficient documentation

## 2023-04-14 DIAGNOSIS — Z975 Presence of (intrauterine) contraceptive device: Secondary | ICD-10-CM | POA: Diagnosis not present

## 2023-04-14 NOTE — Progress Notes (Signed)
Follow Up Note: Gyn-Onc  Darlene [name] 48 y.o. female  CC: Follow-up visit   HPI: The oncology history was reviewed.  Interval History: She denies any vaginal bleeding, abdominal/pelvic pain, cough, lethargy or abdominal distention.   The histology from the EMBx performed in 9/24 showed focal, complex EH w/o atypia. She is accompanied by Darlene [name], her friend, via Facetime.  She has been experiencing irregular bleeding patterns since the insertion of the Mirena IUD and a prior D&C. Her menstrual cycle is inconsistent, with bleeding that is not heavy but varies in color from red to brown. She has not had a 'real true period' since the [facility] and Mirena insertion, but recently, her cycle has been more consistent. She experiences PMS symptoms such as chocolate cravings and increased hunger, along with slight cramps, but notes a significant reduction in cramping since the D&C.  She is actively managing her health, including weight loss, and has decreased her weight from 321 pounds to 271 pounds. She is on Trulicity and states that her lab work, including A1c, blood pressure, and cholesterol, is within normal ranges. Her health is her top priority and she is not currently planning for pregnancy due to the absence of a suitable partner.   Procedure: Endometrial Biopsy Description: Speculum placed. Cervix cleaned. Several passes were made w/o the use of a tenaculum w/a pipelle Consent: Written consent was obtained     Review of Systems  Review of Systems  Constitutional:  Negative for malaise/fatigue and weight loss.  Respiratory:  Negative for shortness of breath and wheezing.   Cardiovascular:  Negative for chest pain and leg swelling.  Gastrointestinal:  Negative for abdominal pain, blood in stool, constipation, nausea and vomiting.  Genitourinary:  Negative for dysuria, frequency, hematuria and urgency.  Musculoskeletal:  Negative for joint pain and myalgias.  Neurological:  Negative for  weakness.  Psychiatric/Behavioral:  Negative for depression. The patient does not have insomnia.    Current medications, allergy, social history, past surgical history, past medical history, family history were all reviewed.    Vitals:  BP 110/77 (BP Location: Left Arm, Patient Position: Sitting)   Pulse 64   Temp 98.7 F (37.1 C) (Oral)   Resp 18   Ht 5\' 3"  (1.6 m)   Wt 271 lb (122.9 kg)   SpO2 100%   BMI 48.01 kg/m    Physical Exam:  Exam conducted with a chaperone present.  Constitutional:      General: She is not in acute distress. Cardiovascular:     Rate and Rhythm: Normal rate and regular rhythm.  Pulmonary:     Effort: Pulmonary effort is normal.     Breath sounds: Normal breath sounds. No wheezing or rhonchi.  Abdominal:     Palpations: Abdomen is soft.     Tenderness: There is no abdominal tenderness. There is no right CVA tenderness or left CVA tenderness.     Hernia: No hernia is present.  Genitourinary:    General: Normal vulva.     Urethra: No urethral lesion.     Vagina: No lesions. No bleeding    Cervix:IUD strings seen Musculoskeletal:     Cervical back: Neck supple.     Right lower leg: No edema.     Left lower leg: No edema.  Lymphadenopathy:     Upper Body:     Right upper body: No supraclavicular adenopathy.     Left upper body: No supraclavicular adenopathy.     Lower Body: No right inguinal  adenopathy. No left inguinal adenopathy.  Skin:    Findings: No rash.  Neurological:     Mental Status: She is oriented to person, place, and time.   Assessment/Plan:  Endometrial cancer (HCC) Darlene [name] is a 48 y.o. 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 Last endometrial biopsy was positive for complex hyperplasia w/o atypia.  She expresses the desire to for uterine preservation.  We discussed the limited durability of a long  term response to medical treatment and that a uterine/fertility-sparing option is not the standard of care for the treatment of EC.Definitive surgery w/staging is recommended after childbearing is complete per NCCN guidelines   -Review the histology from the EMBx -Continue  surveillance/endometrial sampling q 6 - 12 mos, maintenance progestin/LNG IUD   I personally spent 30 minutes face-to-face and non-face-to-face in the care of this patient, which includes all pre, intra, and post visit time on the date of service.    Antionette Char, MD

## 2023-04-14 NOTE — Patient Instructions (Signed)
Return in 6 months with Dr. Pricilla Holm.

## 2023-04-14 NOTE — Assessment & Plan Note (Addendum)
Darlene [name] is a 48 y.o. 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 Last endometrial biopsy was positive for complex hyperplasia w/o atypia.  She expresses the desire to for uterine preservation.  We discussed the limited durability of a long term response to medical treatment and that a uterine/fertility-sparing option is not the standard of care for the treatment of EC.Definitive surgery w/staging is recommended after childbearing is complete per NCCN guidelines   -Review the histology from the EMBx -Continue  surveillance/endometrial sampling q 6 - 12 mos, maintenance progestin/LNG IUD

## 2023-04-15 DIAGNOSIS — F4310 Post-traumatic stress disorder, unspecified: Secondary | ICD-10-CM | POA: Diagnosis not present

## 2023-04-15 DIAGNOSIS — F332 Major depressive disorder, recurrent severe without psychotic features: Secondary | ICD-10-CM | POA: Diagnosis not present

## 2023-04-16 LAB — SURGICAL PATHOLOGY

## 2023-04-21 ENCOUNTER — Ambulatory Visit: Payer: 59 | Admitting: Family Medicine

## 2023-04-21 ENCOUNTER — Other Ambulatory Visit: Payer: Self-pay | Admitting: Internal Medicine

## 2023-04-22 ENCOUNTER — Ambulatory Visit: Payer: 59 | Admitting: Family Medicine

## 2023-04-26 ENCOUNTER — Telehealth: Payer: Self-pay | Admitting: Surgery

## 2023-04-26 NOTE — Telephone Encounter (Signed)
 Patient called requesting her pathology results from 2/12. Advised patient that our office would reach out to Dr Tamela Oddi and request that she reach out to patient to review results. Patient advised to call back Wednesday afternoon if she has not received a call before then. Patient verbalized understanding and had no other concerns.

## 2023-04-27 NOTE — Progress Notes (Unsigned)
 [name] [address] [place] [address] Phone: [phone] Subjective:   [name], am serving as a scribe for Dr. Antoine Primas.  I'm seeing this patient by the request  of:  Pincus Sanes, MD  CC: bilateral knee pain   [id number]  01/06/2023 Severe overall.  Discussed icing regimen and home exercises.  Discussed crepitus.  Discussed with patient has been relatively noncompliant so we will get patient into formal physical therapy to hold her more accountable.  Patient is motivated just finding it difficult to find the regular activities.  Follow-up with me again 2 to 3 months.   Update 04/28/2023 Darlene [name] is a 48 y.o. female coming in with complaint of B knee pain. Patient states did PT. PT is working. Celebrex is also working. R hip is biggest concern. Possible protrusion?   Past Medical History:  Diagnosis Date   Arthritis    hands, low back   Depression    Environmental and seasonal allergies    Gallstones    05-05-2022  per pt no issues currently   Genital herpes    GERD (gastroesophageal reflux disease)    05-05-2022  per pt watches diet , not meds   Hyperlipidemia    Hypertension    Insomnia    Menorrhagia    Mixed incontinence urge and stress    Morbid obesity (HCC)    OSA (obstructive sleep apnea) 2011   (05-05-2022  per pt tried  cpap but intolerent and did not follow up)  first dx 2011--- last sleep study done by dr dohmeier 01-17-2020 moderate to severe osa,  cpap on back order ,  finally received 04/ 2022   Type 2 diabetes mellitus (HCC)    followed by pcp;   (05-05-2022  per pt check blood sugar 3-4 times daily,  fasting average blood sugar 108-115)   Uterine leiomyoma    Vitiligo    Wears contact lenses    Past Surgical History:  Procedure Laterality Date   COLONOSCOPY WITH PROPOFOL N/A 09/20/2020   Procedure: COLONOSCOPY WITH PROPOFOL;  Surgeon: Jeani Hawking, MD;  Location: [facility];  Service:  Endoscopy;  Laterality: N/A;   DILATATION & CURETTAGE/HYSTEROSCOPY WITH MYOSURE N/A 05/07/2022   Procedure: DILATATION & CURETTAGE/HYSTEROSCOPY WITH MYOSURE;  Surgeon: Edwinna Areola, DO;  Location: [facility];  Service: Gynecology;  Laterality: N/A;   WISDOM TOOTH EXTRACTION     Social History   Socioeconomic History   Marital status: Single    Spouse name: Not on file   Number of children: 0   Years of education: some college   Highest education level: Associate degree: occupational, Scientist, product/process development, or vocational program  Occupational History   Occupation: customer service  Tobacco Use   Smoking status: Never   Smokeless tobacco: Never  Vaping Use   Vaping status: Former   Substances: THC  Substance and Sexual Activity   Alcohol use: Not Currently    Comment: rare   Drug use: Not Currently    Types: Marijuana   Sexual activity: Yes    Birth control/protection: None  Other Topics Concern   Not on file  Social History Narrative   Lives with her mother.   Right-handed.   Caffeine use: 1-2 cups per day.   Social Drivers of Corporate investment banker Strain: High Risk (04/01/2023)   Overall Financial Resource Strain (CARDIA)    Difficulty of Paying Living Expenses: Hard  Food Insecurity: No  Food Insecurity (04/01/2023)   Hunger Vital Sign    Worried About Running Out of Food in the Last Year: Never true    Ran Out of Food in the Last Year: Never true  Transportation Needs: No Transportation Needs (04/01/2023)   PRAPARE - Administrator, Civil Service (Medical): No    Lack of Transportation (Non-Medical): No  Physical Activity: Inactive (04/01/2023)   Exercise Vital Sign    Days of Exercise per Week: 2 days    Minutes of Exercise per Session: 0 min  Stress: Stress Concern Present (04/01/2023)   [employer] of Occupational Health - Occupational Stress Questionnaire    Feeling of Stress : Rather much  Social Connections: Socially Isolated  (04/01/2023)   Social Connection and Isolation Panel [NHANES]    Frequency of Communication with Friends and Family: More than three times a week    Frequency of Social Gatherings with Friends and Family: Patient declined    Attends Religious Services: Never    Database administrator or Organizations: No    Attends Engineer, structural: Not on file    Marital Status: Never married   Allergies  Allergen Reactions   Cat Dander Other (See Comments)    Difficulty Breathing, Eye irritation   Yeast-Derived Drug Products Other (See Comments)    Per allergy test   Seasonal Ic [Octacosanol]     Pollen- Watery itchy eyes    Chlorpheniramine     Tussionex - caused itching   Apple Rash   Latex Rash   Rice Rash   Tomato Rash and Other (See Comments)    Itchy throat (rash around mouth)   Wound Dressing Adhesive Rash   Family History  Problem Relation Age of Onset   Breast cancer Mother    Ovarian cysts Mother    Hypertension Mother    Diabetes Mother    Stroke Father    Diabetes Father    Cancer Father        bone marrow   Breast cancer Other    Colon cancer Neg Hx    Endometrial cancer Neg Hx    Pancreatic cancer Neg Hx    Prostate cancer Neg Hx     Current Outpatient Medications (Endocrine & Metabolic):    levonorgestrel (MIRENA) 20 MCG/DAY IUD, To be placed in the GYN ONC office   metFORMIN (GLUCOPHAGE) 1000 MG tablet, TAKE 1 TABLET BY MOUTH 2 TIMES DAILY - IN THE MORNING & AT BEDTIME   TRULICITY 4.5 MG/0.5ML SOAJ, inject 4.5mg  into THE SKIN ONCE WEEKLY  Current Outpatient Medications (Cardiovascular):    amLODipine (NORVASC) 5 MG tablet, TAKE 1 TABLET BY MOUTH EVERY MORNING   losartan-hydrochlorothiazide (HYZAAR) 100-25 MG tablet, TAKE 1 TABLET BY MOUTH EVERY MORNING   rosuvastatin (CRESTOR) 10 MG tablet, TAKE 1 TABLET BY MOUTH EVERY MORNING   spironolactone (ALDACTONE) 50 MG tablet, TAKE 1 TABLET BY MOUTH EVERY MORNING  Current Outpatient Medications  (Respiratory):    fluticasone (FLONASE) 50 MCG/ACT nasal spray, Place 2 sprays into both nostrils daily.   levocetirizine (XYZAL) 5 MG tablet, TAKE 1 TABLET BY MOUTH EVERY DAY AS NEEDED FOR allergies (cough/drainage)   promethazine-dextromethorphan (PROMETHAZINE-DM) 6.25-15 MG/5ML syrup, Take 5 mLs by mouth 4 (four) times daily as needed.  Current Outpatient Medications (Analgesics):    celecoxib (CELEBREX) 200 MG capsule, One to 2 tablets by mouth daily as needed for pain.   Current Outpatient Medications (Other):    ACCU-CHEK GUIDE TEST test strip,  USE TO check glucose 1-2 TIMES DAILY   blood glucose meter kit and supplies KIT, One touch glucometer.   Use up to four times daily as directed. (FOR E11.9).   Lancets (ONETOUCH DELICA PLUS LANCET33G) MISC, USE AS DIRECTED TO check sugars 1-2 TIMES A day-will last 50 DAYS   Multiple Vitamin (MULTIVITAMIN) capsule, Take 1 capsule by mouth daily.   PARoxetine (PAXIL) 30 MG tablet, TAKE 1 TABLET BY MOUTH EVERY MORNING   Reviewed prior external information including notes and imaging from  primary care provider As well as notes that were available from care everywhere and other healthcare systems.  Past medical history, social, surgical and family history all reviewed in electronic medical record.  No pertanent information unless stated regarding to the chief complaint.   Review of Systems:  No headache, visual changes, nausea, vomiting, diarrhea, constipation, dizziness, abdominal pain, skin rash, fevers, chills, night sweats, weight loss, swollen lymph nodes, body aches, joint swelling, chest pain, shortness of breath, mood changes. POSITIVE muscle aches  Objective  Blood pressure 112/76, pulse (!) 106, height 5\' 3"  (1.6 m), weight 262 lb (118.8 kg), SpO2 95%.   General: No apparent distress alert and oriented x3 mood and affect normal, dressed appropriately.  HEENT: Pupils equal, extraocular movements intact  Respiratory: Patient's speak in  full sentences and does not appear short of breath  Cardiovascular: No lower extremity edema, non tender, no erythema  Bilateral knee exam shows arthritic changes noted.  Trace effusion noted of the patellofemoral joint.  Does have some crepitus with range of motion. Right hip exam shows the patient is tender to palpation over the greater trochanteric area.  Positive FABER test noted.  No loss of lordosis of the lumbar spine.   After verbal consent patient was prepped with alcohol swab and with a 21-gauge 2 inch needle injected into the right greater trochanteric area with 2 cc of 0.5% Marcaine and 1 cc of Kenalog 40 mg/mL.  No blood loss.  Band-Aid placed.  Postinjection instructions given   Impression and Recommendations:     The above documentation has been reviewed and is accurate and complete Judi Saa, DO

## 2023-04-28 ENCOUNTER — Encounter: Payer: Self-pay | Admitting: Family Medicine

## 2023-04-28 ENCOUNTER — Ambulatory Visit (INDEPENDENT_AMBULATORY_CARE_PROVIDER_SITE_OTHER): Payer: 59 | Admitting: Family Medicine

## 2023-04-28 ENCOUNTER — Telehealth: Payer: Self-pay

## 2023-04-28 ENCOUNTER — Telehealth: Payer: Self-pay | Admitting: Oncology

## 2023-04-28 VITALS — BP 112/76 | HR 106 | Ht 63.0 in | Wt 262.0 lb

## 2023-04-28 DIAGNOSIS — F332 Major depressive disorder, recurrent severe without psychotic features: Secondary | ICD-10-CM | POA: Diagnosis not present

## 2023-04-28 DIAGNOSIS — G8929 Other chronic pain: Secondary | ICD-10-CM | POA: Diagnosis not present

## 2023-04-28 DIAGNOSIS — F4310 Post-traumatic stress disorder, unspecified: Secondary | ICD-10-CM | POA: Diagnosis not present

## 2023-04-28 DIAGNOSIS — M7061 Trochanteric bursitis, right hip: Secondary | ICD-10-CM | POA: Insufficient documentation

## 2023-04-28 DIAGNOSIS — M170 Bilateral primary osteoarthritis of knee: Secondary | ICD-10-CM

## 2023-04-28 DIAGNOSIS — M25551 Pain in right hip: Secondary | ICD-10-CM

## 2023-04-28 DIAGNOSIS — M25561 Pain in right knee: Secondary | ICD-10-CM

## 2023-04-28 DIAGNOSIS — M25562 Pain in left knee: Secondary | ICD-10-CM

## 2023-04-28 MED ORDER — CELECOXIB 200 MG PO CAPS: ORAL_CAPSULE | ORAL | 0 refills | Status: DC

## 2023-04-28 NOTE — Assessment & Plan Note (Addendum)
 Patient given injection and tolerated the procedure well, discussed icing regimen and home exercises.  Increase activity slowly.  Increase activity slowly otherwise.  Patient will also start with formal physical therapy which patient has done well with with the knee somewhat.  New problem and differential does include lumbar radicular symptoms.  Hopefully patient does well though.  Follow-up again 6 to 8 weeks otherwise.  3 months of Celebrex 200 mg tablets prescribed again.

## 2023-04-28 NOTE — Telephone Encounter (Signed)
 Called Darlene [name] and let her know that her biopsy showed endometrial intraepithelial neoplasia (EIN) which is changed from her last biopsy which showed complex hyperplasia.  Dr. Tamela Oddi feels that this is indication that medical management (IUD) may not be working to treat the lining of the uterus.  She would like her to see Dr. Pricilla Holm to discuss more in detail with her.  Darlene [name] verbalized understanding and agreement.  Appointment with Dr. Pricilla Holm scheduled for 05/27/23 at 4:15.  [name] also expressed concern about why it took so long to go over the biopsy results and said she has never had a delay like this.  She has been very anxious about the results and did not know what they meant.  Apologized for the delay and let her know that I will let Dr. Pricilla Holm know about her concerns.

## 2023-04-28 NOTE — Telephone Encounter (Signed)
 Pt calling stating she had an appointment with Dr.Jackson-Moore and had a biopsy done on 2/12. She would like to get results.

## 2023-04-28 NOTE — Patient Instructions (Addendum)
 Injection in [facility] PT referral Celebrex refill 3 months See you again in 3 months

## 2023-04-28 NOTE — Assessment & Plan Note (Signed)
 Known arthritic changes of the knees.  Is doing better with the physical therapy and would like to continue some.  New order put in for this.  Discussed icing regimen and home exercises, discussed which activities to do and which ones to avoid.  Follow-up with me again in 10 to 12 weeks

## 2023-05-05 ENCOUNTER — Ambulatory Visit: Payer: 59 | Attending: Family Medicine | Admitting: Physical Therapy

## 2023-05-05 ENCOUNTER — Telehealth: Payer: Self-pay

## 2023-05-05 ENCOUNTER — Other Ambulatory Visit: Payer: Self-pay

## 2023-05-05 ENCOUNTER — Encounter: Payer: Self-pay | Admitting: Physical Therapy

## 2023-05-05 DIAGNOSIS — R262 Difficulty in walking, not elsewhere classified: Secondary | ICD-10-CM | POA: Insufficient documentation

## 2023-05-05 DIAGNOSIS — G8929 Other chronic pain: Secondary | ICD-10-CM | POA: Insufficient documentation

## 2023-05-05 DIAGNOSIS — M25661 Stiffness of right knee, not elsewhere classified: Secondary | ICD-10-CM

## 2023-05-05 DIAGNOSIS — R293 Abnormal posture: Secondary | ICD-10-CM | POA: Insufficient documentation

## 2023-05-05 DIAGNOSIS — M6281 Muscle weakness (generalized): Secondary | ICD-10-CM | POA: Insufficient documentation

## 2023-05-05 DIAGNOSIS — M170 Bilateral primary osteoarthritis of knee: Secondary | ICD-10-CM | POA: Diagnosis not present

## 2023-05-05 DIAGNOSIS — M25551 Pain in right hip: Secondary | ICD-10-CM

## 2023-05-05 DIAGNOSIS — M25662 Stiffness of left knee, not elsewhere classified: Secondary | ICD-10-CM
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
## 2023-05-05 NOTE — Telephone Encounter (Signed)
 Attempted to call pt, no answer. Dr. Servando Salina would like pt added to schedule for 3/24.  Offer times: 8am or 1:20pm

## 2023-05-05 NOTE — Telephone Encounter (Signed)
 Patient returned call.  I reschedule her appt to 3/24 at 1:20pm.  Patient is aware it's at the [facility] office.

## 2023-05-05 NOTE — Telephone Encounter (Signed)
-----   Message from Thomasene Ripple sent at 05/05/2023 10:33 AM EST ----- Bring her on 3/24

## 2023-05-06 ENCOUNTER — Encounter: Payer: Self-pay | Admitting: Internal Medicine

## 2023-05-06 ENCOUNTER — Encounter: Payer: Self-pay | Admitting: Cardiology

## 2023-05-06 ENCOUNTER — Encounter: Payer: Self-pay | Admitting: Family Medicine

## 2023-05-06 DIAGNOSIS — F332 Major depressive disorder, recurrent severe without psychotic features: Secondary | ICD-10-CM | POA: Diagnosis not present

## 2023-05-06 DIAGNOSIS — F4310 Post-traumatic stress disorder, unspecified: Secondary | ICD-10-CM | POA: Diagnosis not present

## 2023-05-11 ENCOUNTER — Ambulatory Visit

## 2023-05-11 DIAGNOSIS — R293 Abnormal posture: Secondary | ICD-10-CM | POA: Diagnosis not present

## 2023-05-11 DIAGNOSIS — M170 Bilateral primary osteoarthritis of knee: Secondary | ICD-10-CM | POA: Diagnosis not present

## 2023-05-11 DIAGNOSIS — F4310 Post-traumatic stress disorder, unspecified: Secondary | ICD-10-CM | POA: Diagnosis not present

## 2023-05-11 DIAGNOSIS — R252 Cramp and spasm: Secondary | ICD-10-CM

## 2023-05-11 DIAGNOSIS — M6281 Muscle weakness (generalized): Secondary | ICD-10-CM | POA: Diagnosis not present

## 2023-05-11 DIAGNOSIS — M25662 Stiffness of left knee, not elsewhere classified: Secondary | ICD-10-CM

## 2023-05-11 DIAGNOSIS — M25551 Pain in right hip: Secondary | ICD-10-CM

## 2023-05-11 DIAGNOSIS — R262 Difficulty in walking, not elsewhere classified: Secondary | ICD-10-CM | POA: Diagnosis not present

## 2023-05-11 DIAGNOSIS — G8929 Other chronic pain: Secondary | ICD-10-CM

## 2023-05-11 DIAGNOSIS — M25661 Stiffness of right knee, not elsewhere classified: Secondary | ICD-10-CM

## 2023-05-11 DIAGNOSIS — F332 Major depressive disorder, recurrent severe without psychotic features: Secondary | ICD-10-CM | POA: Diagnosis not present
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
## 2023-05-13 ENCOUNTER — Telehealth: Payer: Self-pay | Admitting: Physical Therapy

## 2023-05-13 ENCOUNTER — Ambulatory Visit: Admitting: Physical Therapy
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
## 2023-05-13 NOTE — Telephone Encounter (Addendum)
 Called pt in regards to missed visit 05/13/23. Able to reach voicemail and remind pt of her next visit 05/18/23. Discussed with pt to call clinic for any changes in schedule or any issues.  Macklin Jacquin April Ma L Heide Brossart, PT  9:25 am    Pt called back and able to reschedule to next available time.   Shadow Stiggers April Ma Alphonsa Overall, PT 9:29 am

## 2023-05-14 ENCOUNTER — Telehealth: Payer: Self-pay

## 2023-05-14 ENCOUNTER — Ambulatory Visit

## 2023-05-14 DIAGNOSIS — R252 Cramp and spasm: Secondary | ICD-10-CM

## 2023-05-14 DIAGNOSIS — M25551 Pain in right hip: Secondary | ICD-10-CM

## 2023-05-14 DIAGNOSIS — R293 Abnormal posture: Secondary | ICD-10-CM

## 2023-05-14 DIAGNOSIS — R262 Difficulty in walking, not elsewhere classified: Secondary | ICD-10-CM | POA: Diagnosis not present

## 2023-05-14 DIAGNOSIS — M25662 Stiffness of left knee, not elsewhere classified: Secondary | ICD-10-CM

## 2023-05-14 DIAGNOSIS — M170 Bilateral primary osteoarthritis of knee: Secondary | ICD-10-CM | POA: Diagnosis not present

## 2023-05-14 DIAGNOSIS — M25562 Pain in left knee: Secondary | ICD-10-CM

## 2023-05-14 DIAGNOSIS — M6281 Muscle weakness (generalized): Secondary | ICD-10-CM | POA: Diagnosis not present

## 2023-05-14 DIAGNOSIS — M25661 Stiffness of right knee, not elsewhere classified: Secondary | ICD-10-CM

## 2023-05-14 DIAGNOSIS — G8929 Other chronic pain: Secondary | ICD-10-CM | POA: Diagnosis not present
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
## 2023-05-14 NOTE — Telephone Encounter (Signed)
 Call placed to patient at 8:15 am to inform of missed appt.  Patient answered and stated she was "an exit away".  Explained to patient that we normally have a 15 min late policy and that by the time she arrived, she would only have about 15 min of treatment time left.  She requests to do the time left of her appt.  Patient arrived at 8:26 am.  Completed 19 min of treatment.  Thoroughly explained attendance policy again.  Patient understands and will call 24 hours ahead if she is unable to attend or is unable to get to appt within 15 min window.Marland Kitchen

## 2023-05-18 ENCOUNTER — Ambulatory Visit: Admitting: Physical Therapy

## 2023-05-18 DIAGNOSIS — M170 Bilateral primary osteoarthritis of knee: Secondary | ICD-10-CM | POA: Diagnosis not present

## 2023-05-18 DIAGNOSIS — R262 Difficulty in walking, not elsewhere classified: Secondary | ICD-10-CM | POA: Diagnosis not present

## 2023-05-18 DIAGNOSIS — R293 Abnormal posture: Secondary | ICD-10-CM | POA: Diagnosis not present

## 2023-05-18 DIAGNOSIS — M6281 Muscle weakness (generalized): Secondary | ICD-10-CM | POA: Diagnosis not present

## 2023-05-18 DIAGNOSIS — M25661 Stiffness of right knee, not elsewhere classified: Secondary | ICD-10-CM

## 2023-05-18 DIAGNOSIS — G8929 Other chronic pain: Secondary | ICD-10-CM

## 2023-05-18 DIAGNOSIS — M25551 Pain in right hip: Secondary | ICD-10-CM

## 2023-05-18 DIAGNOSIS — R252 Cramp and spasm: Secondary | ICD-10-CM

## 2023-05-18 DIAGNOSIS — M25662 Stiffness of left knee, not elsewhere classified: Secondary | ICD-10-CM
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
## 2023-05-19 ENCOUNTER — Ambulatory Visit: Admitting: Physical Therapy
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
## 2023-05-20 ENCOUNTER — Ambulatory Visit: Admitting: Physical Therapy

## 2023-05-20 DIAGNOSIS — F4310 Post-traumatic stress disorder, unspecified: Secondary | ICD-10-CM | POA: Diagnosis not present

## 2023-05-20 DIAGNOSIS — F332 Major depressive disorder, recurrent severe without psychotic features: Secondary | ICD-10-CM | POA: Diagnosis not present

## 2023-05-21 ENCOUNTER — Ambulatory Visit: Admitting: Physical Therapy

## 2023-05-21 ENCOUNTER — Encounter: Payer: Self-pay | Admitting: Physical Therapy

## 2023-05-21 DIAGNOSIS — R279 Unspecified lack of coordination: Secondary | ICD-10-CM

## 2023-05-21 DIAGNOSIS — M6281 Muscle weakness (generalized): Secondary | ICD-10-CM

## 2023-05-21 DIAGNOSIS — G8929 Other chronic pain: Secondary | ICD-10-CM

## 2023-05-21 DIAGNOSIS — R262 Difficulty in walking, not elsewhere classified: Secondary | ICD-10-CM | POA: Diagnosis not present

## 2023-05-21 DIAGNOSIS — M170 Bilateral primary osteoarthritis of knee: Secondary | ICD-10-CM | POA: Diagnosis not present

## 2023-05-21 DIAGNOSIS — M25551 Pain in right hip: Secondary | ICD-10-CM

## 2023-05-21 DIAGNOSIS — R293 Abnormal posture: Secondary | ICD-10-CM | POA: Diagnosis not present

## 2023-05-21 DIAGNOSIS — M25661 Stiffness of right knee, not elsewhere classified: Secondary | ICD-10-CM

## 2023-05-21 DIAGNOSIS — M25662 Stiffness of left knee, not elsewhere classified: Secondary | ICD-10-CM

## 2023-05-21 DIAGNOSIS — R252 Cramp and spasm: Secondary | ICD-10-CM
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
## 2023-05-24 ENCOUNTER — Encounter: Payer: Self-pay | Admitting: Cardiology

## 2023-05-24 ENCOUNTER — Ambulatory Visit: Attending: Cardiology | Admitting: Cardiology

## 2023-05-24 VITALS — BP 114/78 | HR 66 | Ht 64.0 in | Wt 261.0 lb

## 2023-05-24 DIAGNOSIS — I7090 Unspecified atherosclerosis: Secondary | ICD-10-CM

## 2023-05-24 DIAGNOSIS — Z7689 Persons encountering health services in other specified circumstances: Secondary | ICD-10-CM

## 2023-05-24 DIAGNOSIS — I10 Essential (primary) hypertension: Secondary | ICD-10-CM

## 2023-05-24 DIAGNOSIS — I119 Hypertensive heart disease without heart failure: Secondary | ICD-10-CM

## 2023-05-24 DIAGNOSIS — G4733 Obstructive sleep apnea (adult) (pediatric): Secondary | ICD-10-CM | POA: Diagnosis not present

## 2023-05-24 DIAGNOSIS — E785 Hyperlipidemia, unspecified: Secondary | ICD-10-CM

## 2023-05-24 NOTE — Patient Instructions (Signed)
 Medication Instructions:  Your physician recommends that you continue on your current medications as directed. Please refer to the Current Medication list given to you today.  *If you need a refill on your cardiac medications before your next appointment, please call your pharmacy*  Testing/Procedures: Your physician has requested that you have an echocardiogram. Echocardiography is a painless test that uses sound waves to create images of your heart. It provides your doctor with information about the size and shape of your heart and how well your heart's chambers and valves are working. This procedure takes approximately one hour. There are no restrictions for this procedure. Please do NOT wear cologne, perfume, aftershave, or lotions (deodorant is allowed). Please arrive 15 minutes prior to your appointment time.  Please note: We ask at that you not bring children with you during ultrasound (echo/ vascular) testing. Due to room size and safety concerns, children are not allowed in the ultrasound rooms during exams. Our front office staff cannot provide observation of children in our lobby area while testing is being conducted. An adult accompanying a patient to their appointment will only be allowed in the ultrasound room at the discretion of the ultrasound technician under special circumstances. We apologize for any inconvenience.   Dr. Servando Salina has ordered a CT coronary calcium score.   Test locations:  [facility]  [place] [facility] at [facility] [address]  This is $99 out of pocket.   Coronary CalciumScan A coronary calcium scan is an imaging test used to look for deposits of calcium and other fatty materials (plaques) in the inner lining of the blood vessels of the heart (coronary arteries). These deposits of calcium and plaques can partly clog and narrow the coronary arteries without producing any symptoms or warning signs. This  puts a person at risk for a heart attack. This test can detect these deposits before symptoms develop. Tell a health care provider about: Any allergies you have. All medicines you are taking, including vitamins, herbs, eye drops, creams, and over-the-counter medicines. Any problems you or family members have had with anesthetic medicines. Any blood disorders you have. Any surgeries you have had. Any medical conditions you have. Whether you are pregnant or may be pregnant. What are the risks? Generally, this is a safe procedure. However, problems may occur, including: Harm to a pregnant woman and her unborn baby. This test involves the use of radiation. Radiation exposure can be dangerous to a pregnant woman and her unborn baby. If you are pregnant, you generally should not have this procedure done. Slight increase in the risk of cancer. This is because of the radiation involved in the test. What happens before the procedure? No preparation is needed for this procedure. What happens during the procedure? You will undress and remove any jewelry around your neck or chest. You will put on a hospital gown. Sticky electrodes will be placed on your chest. The electrodes will be connected to an electrocardiogram (ECG) machine to record a tracing of the electrical activity of your heart. A CT scanner will take pictures of your heart. During this time, you will be asked to lie still and hold your breath for 2-3 seconds while a picture of your heart is being taken. The procedure may vary among health care providers and hospitals. What happens after the procedure? You can get dressed. You can return to your normal activities. It is up to you to get the results of your test. Ask  your health care provider, or the department that is doing the test, when your results will be ready. Summary A coronary calcium scan is an imaging test used to look for deposits of calcium and other fatty materials (plaques) in  the inner lining of the blood vessels of the heart (coronary arteries). Generally, this is a safe procedure. Tell your health care provider if you are pregnant or may be pregnant. No preparation is needed for this procedure. A CT scanner will take pictures of your heart. You can return to your normal activities after the scan is done. This information is not intended to replace advice given to you by your health care provider. Make sure you discuss any questions you have with your health care provider. Document Released: 08/15/2007 Document Revised: 01/06/2016 Document Reviewed: 01/06/2016 Elsevier Interactive Patient Education  2017 [employer].  Follow-Up: At [facility], you and your health needs are our priority.  As part of our continuing mission to provide you with exceptional heart care, we have created designated Provider Care Teams.  These Care Teams include your primary Cardiologist (physician) and Advanced Practice Providers (APPs -  Physician Assistants and Nurse Practitioners) who all work together to provide you with the care you need, when you need it.   Your next appointment:   1 year(s)  Provider:   Thomasene Ripple, DO

## 2023-05-24 NOTE — Progress Notes (Signed)
 Cardiology Office Note:    Date:  05/24/2023   ID:  Darlene [name], DOB 1975/12/04, MRN [id number]  PCP:  Pincus Sanes, MD  Cardiologist:  None  Electrophysiologist:  None   Referring MD: Pincus Sanes, MD   " I am here to get started with my heart health"  History of Present Illness:    Darlene [name] is a 48 y.o. female with a hx of    with a history of high blood pressure, prediabetes, and sleep apnea, presents with concerns about her recent diagnosis of uterine cancer. She is scheduled to see a hematologist and gynecology team later in the month. She expresses dissatisfaction with her current medical team and is considering seeking a second opinion. She is particularly concerned about the potential need for a hysterectomy and the implications this could have on her hormonal balance. She also expresses concern about her heart health, given her mother's history of congestive heart failure. She has a history of irregular menstrual cycles and has been on birth control since her teens. She also has a history of herpes simplex virus infection. She does not currently use her CPAP machine for sleep apnea due to issues with the machine and mask.  Past Medical History:  Diagnosis Date   Arthritis    hands, low back   Depression    Environmental and seasonal allergies    Gallstones    05-05-2022  per pt no issues currently   Genital herpes    GERD (gastroesophageal reflux disease)    05-05-2022  per pt watches diet , not meds   Hyperlipidemia    Hypertension    Insomnia    Menorrhagia    Mixed incontinence urge and stress    Morbid obesity (HCC)    OSA (obstructive sleep apnea) 2011   (05-05-2022  per pt tried  cpap but intolerent and did not follow up)  first dx 2011--- last sleep study done by dr dohmeier 01-17-2020 moderate to severe osa,  cpap on back order ,  finally received 04/ 2022   Type 2 diabetes mellitus (HCC)    followed by pcp;   (05-05-2022  per pt check blood sugar  3-4 times daily,  fasting average blood sugar 108-115)   Uterine leiomyoma    Vitiligo    Wears contact lenses     Past Surgical History:  Procedure Laterality Date   COLONOSCOPY WITH PROPOFOL N/A 09/20/2020   Procedure: COLONOSCOPY WITH PROPOFOL;  Surgeon: Jeani Hawking, MD;  Location: [facility];  Service: Endoscopy;  Laterality: N/A;   DILATATION & CURETTAGE/HYSTEROSCOPY WITH MYOSURE N/A 05/07/2022   Procedure: DILATATION & CURETTAGE/HYSTEROSCOPY WITH MYOSURE;  Surgeon: Edwinna Areola, DO;  Location: [facility];  Service: Gynecology;  Laterality: N/A;   WISDOM TOOTH EXTRACTION      Current Medications: Current Meds  Medication Sig   ACCU-CHEK GUIDE TEST test strip USE TO check glucose 1-2 TIMES DAILY   amLODipine (NORVASC) 5 MG tablet TAKE 1 TABLET BY MOUTH EVERY MORNING   blood glucose meter kit and supplies KIT One touch glucometer.   Use up to four times daily as directed. (FOR E11.9).   celecoxib (CELEBREX) 200 MG capsule One to 2 tablets by mouth daily as needed for pain.   fluticasone (FLONASE) 50 MCG/ACT nasal spray Place 2 sprays into both nostrils daily.   Lancets (ONETOUCH DELICA PLUS LANCET33G) MISC USE AS DIRECTED TO check sugars 1-2 TIMES A day-will last 50 DAYS  levocetirizine (XYZAL) 5 MG tablet TAKE 1 TABLET BY MOUTH EVERY DAY AS NEEDED FOR allergies (cough/drainage)   losartan-hydrochlorothiazide (HYZAAR) 100-25 MG tablet TAKE 1 TABLET BY MOUTH EVERY MORNING   metFORMIN (GLUCOPHAGE) 1000 MG tablet TAKE 1 TABLET BY MOUTH 2 TIMES DAILY - IN THE MORNING & AT BEDTIME   PARoxetine (PAXIL) 30 MG tablet TAKE 1 TABLET BY MOUTH EVERY MORNING   rosuvastatin (CRESTOR) 10 MG tablet TAKE 1 TABLET BY MOUTH EVERY MORNING   spironolactone (ALDACTONE) 50 MG tablet TAKE 1 TABLET BY MOUTH EVERY MORNING   TRULICITY 4.5 MG/0.5ML SOAJ inject 4.5mg  into THE SKIN ONCE WEEKLY     Allergies:   Cat dander, Yeast-derived drug products, Seasonal ic [octacosanol],  Chlorpheniramine, Apple, Latex, Rice, Tomato, and Wound dressing adhesive   Social History   Socioeconomic History   Marital status: Single    Spouse name: Not on file   Number of children: 0   Years of education: some college   Highest education level: Associate degree: occupational, Scientist, product/process development, or vocational program  Occupational History   Occupation: customer service  Tobacco Use   Smoking status: Never   Smokeless tobacco: Never  Vaping Use   Vaping status: Former   Substances: THC  Substance and Sexual Activity   Alcohol use: Not Currently    Comment: rare   Drug use: Not Currently    Types: Marijuana   Sexual activity: Yes    Birth control/protection: None  Other Topics Concern   Not on file  Social History Narrative   Lives with her mother.   Right-handed.   Caffeine use: 1-2 cups per day.   Social Drivers of Corporate investment banker Strain: High Risk (04/01/2023)   Overall Financial Resource Strain (CARDIA)    Difficulty of Paying Living Expenses: Hard  Food Insecurity: No Food Insecurity (04/01/2023)   Hunger Vital Sign    Worried About Running Out of Food in the Last Year: Never true    Ran Out of Food in the Last Year: Never true  Transportation Needs: No Transportation Needs (04/01/2023)   PRAPARE - Administrator, Civil Service (Medical): No    Lack of Transportation (Non-Medical): No  Physical Activity: Inactive (04/01/2023)   Exercise Vital Sign    Days of Exercise per Week: 2 days    Minutes of Exercise per Session: 0 min  Stress: Stress Concern Present (04/01/2023)   [employer] of Occupational Health - Occupational Stress Questionnaire    Feeling of Stress : Rather much  Social Connections: Socially Isolated (04/01/2023)   Social Connection and Isolation Panel [NHANES]    Frequency of Communication with Friends and Family: More than three times a week    Frequency of Social Gatherings with Friends and Family: Patient declined     Attends Religious Services: Never    Database administrator or Organizations: No    Attends Engineer, structural: Not on file    Marital Status: Never married     Family History: The patient's family history includes Breast cancer in her mother and another family member; Cancer in her father; Diabetes in her father and mother; Hypertension in her mother; Ovarian cysts in her mother; Stroke in her father. There is no history of Colon cancer, Endometrial cancer, Pancreatic cancer, or Prostate cancer.  ROS:   Review of Systems  Constitution: Negative for decreased appetite, fever and weight gain.  HENT: Negative for congestion, ear discharge, hoarse voice and sore throat.  Eyes: Negative for discharge, redness, vision loss in right eye and visual halos.  Cardiovascular: Negative for chest pain, dyspnea on exertion, leg swelling, orthopnea and palpitations.  Respiratory: Negative for cough, hemoptysis, shortness of breath and snoring.   Endocrine: Negative for heat intolerance and polyphagia.  Hematologic/Lymphatic: Negative for bleeding problem. Does not bruise/bleed easily.  Skin: Negative for flushing, nail changes, rash and suspicious lesions.  Musculoskeletal: Negative for arthritis, joint pain, muscle cramps, myalgias, neck pain and stiffness.  Gastrointestinal: Negative for abdominal pain, bowel incontinence, diarrhea and excessive appetite.  Genitourinary: Negative for decreased libido, genital sores and incomplete emptying.  Neurological: Negative for brief paralysis, focal weakness, headaches and loss of balance.  Psychiatric/Behavioral: Negative for altered mental status, depression and suicidal ideas.  Allergic/Immunologic: Negative for HIV exposure and persistent infections.    EKGs/Labs/Other Studies Reviewed:    The following studies were reviewed today:   EKG:  The ekg ordered today demonstrates   sn   Recent Labs: 12/11/2022: ALT 15; BUN 13; Creatinine, Ser  1.06; Hemoglobin 14.2; Platelets 304.0; Potassium 3.9; Sodium 141; TSH 2.43  Recent Lipid Panel    Component Value Date/Time   CHOL 124 09/09/2022 1434   TRIG 104.0 09/09/2022 1434   HDL 38.50 (L) 09/09/2022 1434   CHOLHDL 3 09/09/2022 1434   VLDL 20.8 09/09/2022 1434   LDLCALC 65 09/09/2022 1434   LDLDIRECT 148.6 02/13/2009 0946    Physical Exam:    VS:  BP 114/78 (BP Location: Left Arm, Patient Position: Sitting, Cuff Size: Large)   Pulse 66   Ht 5\' 4"  (1.626 m)   Wt 261 lb (118.4 kg)   SpO2 95%   BMI 44.80 kg/m     Wt Readings from Last 3 Encounters:  05/24/23 261 lb (118.4 kg)  04/28/23 262 lb (118.8 kg)  04/14/23 271 lb (122.9 kg)     GEN: Well nourished, well developed in no acute distress HEENT: Normal NECK: No JVD; No carotid bruits LYMPHATICS: No lymphadenopathy CARDIAC: S1S2 noted,RRR, no murmurs, rubs, gallops RESPIRATORY:  Clear to auscultation without rales, wheezing or rhonchi  ABDOMEN: Soft, non-tender, non-distended, +bowel sounds, no guarding. EXTREMITIES: No edema, No cyanosis, no clubbing MUSCULOSKELETAL:  No deformity  SKIN: Warm and dry NEUROLOGIC:  Alert and oriented x 3, non-focal PSYCHIATRIC:  Normal affect, good insight  ASSESSMENT:    1. Primary Hypertension   2. Encounter to establish care   3. ASVD (arteriosclerotic vascular disease)   4. Hyperlipidemia, unspecified hyperlipidemia type   5. Hypertensive heart disease without heart failure   6. OSA (obstructive sleep apnea)   7. Morbid obesity    PLAN:    Endometrial cancer  Awaiting consultation with hematology and gynecology. Concerns about hysterectomy, ovarian preservation and overall treatment plan,. Seeking second opinion. - Refer to gynecology for treatment options discussion   Obstructive Sleep Apnea Difficulties with CPAP therapy. Possible anatomical obstructions. -may need to see ENT , defer to pcp  Hypertension Well-controlled with medication. Blood pressure  normal. - Continue current antihypertensive regimen.  Morbid Obesity Risk factor for cardiovascular disease. Concerns about heart health impact. - Encourage weight management through diet and exercise.  Prediabetes Increased cardiovascular risk. On Crestor for cholesterol. - Order coronary calcium score for CAD risk assessment. - Continue Crestor for cholesterol management.   Goals of Care Desires to maintain heart health and prevent CHF. Concerns about hysterectomy implications. Seeking second opinion. - Discuss cardiovascular health management and preventive measures. - Support in exploring uterine cancer treatment options.  Follow-up Requires follow-up for uterine cancer, sleep apnea, and cardiovascular health. - Schedule follow-up with gynecology and hematology. - Schedule echocardiogram for heart function assessment. - Schedule coronary calcium score. - Arrange cardiology follow-up in one year or sooner if needed.  The patient is in agreement with the above plan. The patient left the office in stable condition.  The patient will follow up in   Medication Adjustments/Labs and Tests Ordered: Current medicines are reviewed at length with the patient today.  Concerns regarding medicines are outlined above.  Orders Placed This Encounter  Procedures   CT CARDIAC SCORING (SELF PAY ONLY)   Ambulatory referral to Obstetrics / Gynecology   EKG 12-Lead   ECHOCARDIOGRAM COMPLETE   No orders of the defined types were placed in this encounter.   Patient Instructions  Medication Instructions:  Your physician recommends that you continue on your current medications as directed. Please refer to the Current Medication list given to you today.  *If you need a refill on your cardiac medications before your next appointment, please call your pharmacy*  Testing/Procedures: Your physician has requested that you have an echocardiogram. Echocardiography is a painless test that uses sound  waves to create images of your heart. It provides your doctor with information about the size and shape of your heart and how well your heart's chambers and valves are working. This procedure takes approximately one hour. There are no restrictions for this procedure. Please do NOT wear cologne, perfume, aftershave, or lotions (deodorant is allowed). Please arrive 15 minutes prior to your appointment time.  Please note: We ask at that you not bring children with you during ultrasound (echo/ vascular) testing. Due to room size and safety concerns, children are not allowed in the ultrasound rooms during exams. Our front office staff cannot provide observation of children in our lobby area while testing is being conducted. An adult accompanying a patient to their appointment will only be allowed in the ultrasound room at the discretion of the ultrasound technician under special circumstances. We apologize for any inconvenience.   Dr. Servando Salina has ordered a CT coronary calcium score.   Test locations:  [facility]  [facility]  This is $99 out of pocket.   Coronary CalciumScan A coronary calcium scan is an imaging test used to look for deposits of calcium and other fatty materials (plaques) in the inner lining of the blood vessels of the heart (coronary arteries). These deposits of calcium and plaques can partly clog and narrow the coronary arteries without producing any symptoms or warning signs. This puts a person at risk for a heart attack. This test can detect these deposits before symptoms develop. Tell a health care provider about: Any allergies you have. All medicines you are taking, including vitamins, herbs, eye drops, creams, and over-the-counter medicines. Any problems you or family members have had with anesthetic medicines. Any blood disorders you have. Any surgeries you have had. Any medical  conditions you have. Whether you are pregnant or may be pregnant. What are the risks? Generally, this is a safe procedure. However, problems may occur, including: Harm to a pregnant woman and her unborn baby. This test involves the use of radiation. Radiation exposure can be dangerous to a pregnant woman and her unborn baby. If you are pregnant, you generally should not have this procedure done. Slight increase in the risk of cancer. This is because of the radiation involved in the  test. What happens before the procedure? No preparation is needed for this procedure. What happens during the procedure? You will undress and remove any jewelry around your neck or chest. You will put on a hospital gown. Sticky electrodes will be placed on your chest. The electrodes will be connected to an electrocardiogram (ECG) machine to record a tracing of the electrical activity of your heart. A CT scanner will take pictures of your heart. During this time, you will be asked to lie still and hold your breath for 2-3 seconds while a picture of your heart is being taken. The procedure may vary among health care providers and hospitals. What happens after the procedure? You can get dressed. You can return to your normal activities. It is up to you to get the results of your test. Ask your health care provider, or the department that is doing the test, when your results will be ready. Summary A coronary calcium scan is an imaging test used to look for deposits of calcium and other fatty materials (plaques) in the inner lining of the blood vessels of the heart (coronary arteries). Generally, this is a safe procedure. Tell your health care provider if you are pregnant or may be pregnant. No preparation is needed for this procedure. A CT scanner will take pictures of your heart. You can return to your normal activities after the scan is done. This information is not intended to replace advice given to you by your  health care provider. Make sure you discuss any questions you have with your health care provider. Document Released: 08/15/2007 Document Revised: 01/06/2016 Document Reviewed: 01/06/2016 Elsevier Interactive Patient Education  2017 [employer].  Follow-Up: At [facility], you and your health needs are our priority.  As part of our continuing mission to provide you with exceptional heart care, we have created designated Provider Care Teams.  These Care Teams include your primary Cardiologist (physician) and Advanced Practice Providers (APPs -  Physician Assistants and Nurse Practitioners) who all work together to provide you with the care you need, when you need it.   Your next appointment:   1 year(s)  Provider:   Thomasene Ripple, DO    Adopting a Healthy Lifestyle.  Know what a healthy weight is for you (roughly BMI <25) and aim to maintain this   Aim for 7+ servings of fruits and vegetables daily   65-80+ fluid ounces of water or unsweet tea for healthy kidneys   Limit to max 1 drink of alcohol per day; avoid smoking/tobacco   Limit animal fats in diet for cholesterol and heart health - choose grass fed whenever available   Avoid highly processed foods, and foods high in saturated/trans fats   Aim for low stress - take time to unwind and care for your mental health   Aim for 150 min of moderate intensity exercise weekly for heart health, and weights twice weekly for bone health   Aim for 7-9 hours of sleep daily   When it comes to diets, agreement about the perfect plan isnt easy to find, even among the experts. Experts at the [facility] of [employer] developed an idea known as the Healthy Eating Plate. Just imagine a plate divided into logical, healthy portions.   The emphasis is on diet quality:   Load up on vegetables and fruits - one-half of your plate: Aim for color and variety, and remember that potatoes dont count.   Go for whole grains -  one-quarter of  your plate: Whole wheat, barley, wheat berries, quinoa, oats, brown rice, and foods made with them. If you want pasta, go with whole wheat pasta.   Protein power - one-quarter of your plate: Fish, chicken, beans, and nuts are all healthy, versatile protein sources. Limit red meat.   The diet, however, does go beyond the plate, offering a few other suggestions.   Use healthy plant oils, such as olive, canola, soy, corn, sunflower and peanut. Check the labels, and avoid partially hydrogenated oil, which have unhealthy trans fats.   If youre thirsty, drink water. Coffee and tea are good in moderation, but skip sugary drinks and limit milk and dairy products to one or two daily servings.   The type of carbohydrate in the diet is more important than the amount. Some sources of carbohydrates, such as vegetables, fruits, whole grains, and beans-are healthier than others.   Finally, stay active  Osvaldo Shipper, DO  05/24/2023 4:27 PM    [facility]

## 2023-05-25 ENCOUNTER — Ambulatory Visit: Admitting: Physical Therapy
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
## 2023-05-26 ENCOUNTER — Ambulatory Visit: Admitting: Physical Therapy

## 2023-05-26 ENCOUNTER — Encounter: Payer: Self-pay | Admitting: Physical Therapy

## 2023-05-26 DIAGNOSIS — F4310 Post-traumatic stress disorder, unspecified: Secondary | ICD-10-CM | POA: Diagnosis not present

## 2023-05-26 DIAGNOSIS — R262 Difficulty in walking, not elsewhere classified: Secondary | ICD-10-CM | POA: Diagnosis not present

## 2023-05-26 DIAGNOSIS — M25662 Stiffness of left knee, not elsewhere classified: Secondary | ICD-10-CM

## 2023-05-26 DIAGNOSIS — G8929 Other chronic pain: Secondary | ICD-10-CM

## 2023-05-26 DIAGNOSIS — M25661 Stiffness of right knee, not elsewhere classified: Secondary | ICD-10-CM

## 2023-05-26 DIAGNOSIS — R279 Unspecified lack of coordination: Secondary | ICD-10-CM

## 2023-05-26 DIAGNOSIS — R293 Abnormal posture: Secondary | ICD-10-CM

## 2023-05-26 DIAGNOSIS — R252 Cramp and spasm: Secondary | ICD-10-CM

## 2023-05-26 DIAGNOSIS — M6281 Muscle weakness (generalized): Secondary | ICD-10-CM

## 2023-05-26 DIAGNOSIS — F332 Major depressive disorder, recurrent severe without psychotic features: Secondary | ICD-10-CM | POA: Diagnosis not present

## 2023-05-26 DIAGNOSIS — M25551 Pain in right hip: Secondary | ICD-10-CM

## 2023-05-26 DIAGNOSIS — M170 Bilateral primary osteoarthritis of knee: Secondary | ICD-10-CM | POA: Diagnosis not present

## 2023-05-27 ENCOUNTER — Other Ambulatory Visit: Payer: Self-pay | Admitting: Internal Medicine

## 2023-05-27 ENCOUNTER — Encounter

## 2023-05-27 ENCOUNTER — Encounter: Payer: Self-pay | Admitting: Gynecologic Oncology

## 2023-05-27 ENCOUNTER — Inpatient Hospital Stay: Payer: 59 | Attending: Obstetrics & Gynecology | Admitting: Gynecologic Oncology

## 2023-05-27 VITALS — BP 121/68 | HR 73 | Temp 99.8°F | Resp 20 | Wt 258.8 lb

## 2023-05-27 DIAGNOSIS — E119 Type 2 diabetes mellitus without complications: Secondary | ICD-10-CM

## 2023-05-27 DIAGNOSIS — Z79890 Hormone replacement therapy (postmenopausal): Secondary | ICD-10-CM | POA: Insufficient documentation

## 2023-05-27 DIAGNOSIS — N8502 Endometrial intraepithelial neoplasia [EIN]: Secondary | ICD-10-CM | POA: Diagnosis not present

## 2023-05-27 DIAGNOSIS — Z975 Presence of (intrauterine) contraceptive device: Secondary | ICD-10-CM | POA: Diagnosis not present

## 2023-05-27 DIAGNOSIS — C541 Malignant neoplasm of endometrium: Secondary | ICD-10-CM | POA: Insufficient documentation

## 2023-05-27 MED ORDER — MEDROXYPROGESTERONE ACETATE 10 MG PO TABS: 10.0000 mg | ORAL_TABLET | Freq: Every day | ORAL | 6 refills | Status: DC

## 2023-05-27 NOTE — Patient Instructions (Signed)
 It was good to see you today.  Let's have you start the progesterone by month and we will plan to repeat a biopsy in 3 months. Please call if you have any changes to your bleeding or how you are feeling between now and your follow-up visit.

## 2023-05-27 NOTE — Progress Notes (Signed)
 Gynecologic Oncology Return Clinic Visit  05/27/23  Reason for Visit: surveillance   Treatment History: Patient was seen in September 2021 and at that time had a thickened endometrial lining noted on imaging.  She opted to undergo hysteroscopy with D&C rather than endometrial biopsy but did not get this scheduled.  She was started on Provera, which she took for 1 month to regulate her menses, but then discontinued the medication.  Repeat ultrasound in 11/2020 showed an endometrial lining of 9 mm.  Patient was seen again in October 2023 and noted menstrual cycle that had been ongoing for a month.  She has a history of abnormal cycles, will often skip multiple months at a time.  When she does have menses, she will bleed for a prolonged period.  Office pelvic ultrasound in October 2023 showed a uterus measuring 8.7 x 5.2 x 5.6 cm with a 2.1 cm thickened and heterogenous appearing endometrium, increased vascularity noted with suspected polyps.  A stable 1.8 cm fibroid.  Ovaries noted to be normal in appearance.  Given appearance of polyps, recommendation was made for hysteroscopy with D&C.   D&C and polypectomy from 05/07/22 showed endometrioid carcinoma, grade 1. MMR itnact, p53 wildtype.   AMH on 3/15: 0.037   MRI pelvis 05/19/22: 7 mm small, focal nodular appearing endometrial lesion in the lower uterine segment.  No evidence of myometrial invasion.  Normal ovaries. No adenopathy or other evidence of metastatic disease.   Mirena IUD placed on 05/22/22.  Endometrial biopsy on 08/28/2022: Benign inactive endometrium, benign cervical glandular mucosa.  No hyperplasia or malignancy.  Endometrial biopsy on 11/26/2022: Endometrium with focal complex nonatypical hyperplasia, mucinous metaplasia and stromal pseudodecidualization consistent with hormone treatment effect.  Endometrial biopsy on 04/14/2023: EIN with stromal progestational effects.  No definitive low-grade endometrial cancer.  Interval  History: Overall doing well.  Has felt some PMS symptoms this week including bloating.  Denies any recent menstrual bleeding.  Wears a pad every day and sometimes sees blood in the toilet after voiding or when she wipes, denies any blood on her pad.  Continues to work on weight loss.  Is down 63 pounds from her initial weight of 321 pounds.  She continues to take Trulicity and metformin.  Past Medical/Surgical History: Past Medical History:  Diagnosis Date   Arthritis    hands, low back   Depression    Environmental and seasonal allergies    Gallstones    05-05-2022  per pt no issues currently   Genital herpes    GERD (gastroesophageal reflux disease)    05-05-2022  per pt watches diet , not meds   Hyperlipidemia    Hypertension    Insomnia    Menorrhagia    Mixed incontinence urge and stress    Morbid obesity (HCC)    OSA (obstructive sleep apnea) 2011   (05-05-2022  per pt tried  cpap but intolerent and did not follow up)  first dx 2011--- last sleep study done by dr dohmeier 01-17-2020 moderate to severe osa,  cpap on back order ,  finally received 04/ 2022   Type 2 diabetes mellitus (HCC)    followed by pcp;   (05-05-2022  per pt check blood sugar 3-4 times daily,  fasting average blood sugar 108-115)   Uterine leiomyoma    Vitiligo    Wears contact lenses     Past Surgical History:  Procedure Laterality Date   COLONOSCOPY WITH PROPOFOL N/A 09/20/2020   Procedure: COLONOSCOPY WITH PROPOFOL;  Surgeon: Jeani Hawking, MD;  Location: [facility];  Service: Endoscopy;  Laterality: N/A;   DILATATION & CURETTAGE/HYSTEROSCOPY WITH MYOSURE N/A 05/07/2022   Procedure: DILATATION & CURETTAGE/HYSTEROSCOPY WITH MYOSURE;  Surgeon: Edwinna Areola, DO;  Location: [facility];  Service: Gynecology;  Laterality: N/A;   WISDOM TOOTH EXTRACTION      Family History  Problem Relation Age of Onset   Breast cancer Mother    Ovarian cysts Mother    Hypertension Mother     Diabetes Mother    Stroke Father    Diabetes Father    Cancer Father        bone marrow   Breast cancer Other    Colon cancer Neg Hx    Endometrial cancer Neg Hx    Pancreatic cancer Neg Hx    Prostate cancer Neg Hx     Social History   Socioeconomic History   Marital status: Single    Spouse name: Not on file   Number of children: 0   Years of education: some college   Highest education level: Associate degree: occupational, Scientist, product/process development, or vocational program  Occupational History   Occupation: customer service  Tobacco Use   Smoking status: Never   Smokeless tobacco: Never  Vaping Use   Vaping status: Former   Substances: THC  Substance and Sexual Activity   Alcohol use: Not Currently    Comment: rare   Drug use: Not Currently    Types: Marijuana   Sexual activity: Yes    Birth control/protection: None  Other Topics Concern   Not on file  Social History Narrative   Lives with her mother.   Right-handed.   Caffeine use: 1-2 cups per day.   Social Drivers of Corporate investment banker Strain: High Risk (04/01/2023)   Overall Financial Resource Strain (CARDIA)    Difficulty of Paying Living Expenses: Hard  Food Insecurity: No Food Insecurity (04/01/2023)   Hunger Vital Sign    Worried About Running Out of Food in the Last Year: Never true    Ran Out of Food in the Last Year: Never true  Transportation Needs: No Transportation Needs (04/01/2023)   PRAPARE - Administrator, Civil Service (Medical): No    Lack of Transportation (Non-Medical): No  Physical Activity: Inactive (04/01/2023)   Exercise Vital Sign    Days of Exercise per Week: 2 days    Minutes of Exercise per Session: 0 min  Stress: Stress Concern Present (04/01/2023)   [employer] of Occupational Health - Occupational Stress Questionnaire    Feeling of Stress : Rather much  Social Connections: Socially Isolated (04/01/2023)   Social Connection and Isolation Panel [NHANES]    Frequency  of Communication with Friends and Family: More than three times a week    Frequency of Social Gatherings with Friends and Family: Patient declined    Attends Religious Services: Never    Database administrator or Organizations: No    Attends Engineer, structural: Not on file    Marital Status: Never married    Current Medications:  Current Outpatient Medications:    ACCU-CHEK GUIDE TEST test strip, USE TO check glucose 1-2 TIMES DAILY, Disp: 100 strip, Rfl: 0   amLODipine (NORVASC) 5 MG tablet, TAKE 1 TABLET BY MOUTH EVERY MORNING, Disp: 90 tablet, Rfl: 3   blood glucose meter kit and supplies KIT, One touch glucometer.   Use up to four times daily as directed. (FOR E11.9).,  Disp: 1 each, Rfl: 0   celecoxib (CELEBREX) 200 MG capsule, One to 2 tablets by mouth daily as needed for pain., Disp: 180 capsule, Rfl: 0   fluticasone (FLONASE) 50 MCG/ACT nasal spray, Place 2 sprays into both nostrils daily., Disp: 16 g, Rfl: 6   Lancets (ONETOUCH DELICA PLUS LANCET33G) MISC, USE AS DIRECTED TO check sugars 1-2 TIMES A day-will last 50 DAYS, Disp: 100 each, Rfl: 0   levocetirizine (XYZAL) 5 MG tablet, TAKE 1 TABLET BY MOUTH EVERY DAY AS NEEDED FOR allergies (cough/drainage), Disp: 90 tablet, Rfl: 3   levonorgestrel (MIRENA) 20 MCG/DAY IUD, To be placed in the GYN ONC office, Disp: 1 each, Rfl: 0   losartan-hydrochlorothiazide (HYZAAR) 100-25 MG tablet, TAKE 1 TABLET BY MOUTH EVERY MORNING, Disp: 90 tablet, Rfl: 3   medroxyPROGESTERone (PROVERA) 10 MG tablet, Take 1 tablet (10 mg total) by mouth daily., Disp: 30 tablet, Rfl: 6   metFORMIN (GLUCOPHAGE) 1000 MG tablet, TAKE 1 TABLET BY MOUTH 2 TIMES DAILY - IN THE MORNING & AT BEDTIME, Disp: 180 tablet, Rfl: 3   Multiple Vitamin (MULTIVITAMIN) capsule, Take 1 capsule by mouth daily., Disp: , Rfl:    PARoxetine (PAXIL) 30 MG tablet, TAKE 1 TABLET BY MOUTH EVERY MORNING, Disp: 90 tablet, Rfl: 1   rosuvastatin (CRESTOR) 10 MG tablet, TAKE 1 TABLET  BY MOUTH EVERY MORNING, Disp: 90 tablet, Rfl: 3   spironolactone (ALDACTONE) 50 MG tablet, TAKE 1 TABLET BY MOUTH EVERY MORNING, Disp: 90 tablet, Rfl: 3   TRULICITY 4.5 MG/0.5ML SOAJ, inject 4.5mg  into THE SKIN ONCE WEEKLY, Disp: 2 mL, Rfl: 3  Review of Systems: + joint pain, anxiety, dpression Denies appetite changes, fevers, chills, fatigue, unexplained weight changes. Denies hearing loss, neck lumps or masses, mouth sores, ringing in ears or voice changes. Denies cough or wheezing.  Denies shortness of breath. Denies chest pain or palpitations. Denies leg swelling. Denies abdominal distention, pain, blood in stools, constipation, diarrhea, nausea, vomiting, or early satiety. Denies pain with intercourse, dysuria, frequency, hematuria or incontinence. Denies hot flashes, pelvic pain, vaginal bleeding or vaginal discharge.   Denies joint pain, back pain or muscle pain/cramps. Denies itching, rash, or wounds. Denies dizziness, headaches, numbness or seizures. Denies swollen lymph nodes or glands, denies easy bruising or bleeding. Denies confusion or decreased concentration.  Physical Exam: BP 121/68 (BP Location: Right Arm, Patient Position: Sitting)   Pulse 73   Temp 99.8 F (37.7 C) (Oral)   Resp 20   Wt 258 lb 12.8 oz (117.4 kg)   SpO2 98%   BMI 44.42 kg/m  General: Alert, oriented, no acute distress. HEENT: Posterior oropharynx clear, sclera anicteric. Chest: Unlabored breathing on room air. Abdomen: Obese, soft, nontender.  Normoactive bowel sounds.  No masses or hepatosplenomegaly appreciated.   Extremities: Grossly normal range of motion.  Warm, well perfused.  No edema bilaterally. GU: Normal appearing external genitalia without erythema, excoriation, or lesions.  Bimanual exam reveals normal cervix on palpation, mobile uterus, no adnexal masses.    Laboratory & Radiologic Studies: None new  Assessment & Plan: Darlene [name] is a 48 y.o. 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  Patient is doing well.  Has very scant intermittent spotting.  Reviewed last biopsy from early February which shows progestational effects but EIN now after regression to benign endometrium initially after IUD placement.  Her preference  is still to avoid hysterectomy but she is understanding of the fact that we may ultimately have to move forward with definitive surgery.  We discussed options including moving forward with hysterectomy now versus adding some additional progesterone and repeating a biopsy in 3 months.  She is already on metformin.  Her preference is to add additional progesterone with repeat biopsy.  If repeat biopsy shows persistent EIN or endometrial cancer, we will revisit her readiness for total hysterectomy.  We discussed possibility of leaving her ovaries at the time of total hysterectomy.  As long as they are normal in appearance and pathology reveals either EIN or early stage low-grade endometrioid endometrial cancer, I think it would be reasonable to leave them.  If ovaries require removal at the time of surgery, as long as early stage cancer or EIN confirmed on final pathology, we discussed possibility of hormone replacement therapy.  I stressed the importance of calling if she develops any new and concerning symptoms or has increased vaginal bleeding.  I will plan to see her back in 3 months for repeat endometrial biopsy.  Patient was congratulated on her weight loss today and given encouragement for continued weight loss.  24 minutes of total time was spent for this patient encounter, including preparation, face-to-face counseling with the patient and coordination of care, and documentation of the encounter.  Eugene Garnet, MD  Division of Gynecologic Oncology  Department of Obstetrics and Gynecology   [facility]

## 2023-05-28 ENCOUNTER — Ambulatory Visit: Payer: 59 | Admitting: Cardiology

## 2023-05-28 ENCOUNTER — Ambulatory Visit: Admitting: Physical Therapy

## 2023-05-28 DIAGNOSIS — R262 Difficulty in walking, not elsewhere classified: Secondary | ICD-10-CM

## 2023-05-28 DIAGNOSIS — M6281 Muscle weakness (generalized): Secondary | ICD-10-CM | POA: Diagnosis not present

## 2023-05-28 DIAGNOSIS — R293 Abnormal posture: Secondary | ICD-10-CM | POA: Diagnosis not present

## 2023-05-28 DIAGNOSIS — M25661 Stiffness of right knee, not elsewhere classified: Secondary | ICD-10-CM

## 2023-05-28 DIAGNOSIS — G8929 Other chronic pain: Secondary | ICD-10-CM | POA: Diagnosis not present

## 2023-05-28 DIAGNOSIS — M170 Bilateral primary osteoarthritis of knee: Secondary | ICD-10-CM | POA: Diagnosis not present

## 2023-05-28 DIAGNOSIS — M25662 Stiffness of left knee, not elsewhere classified: Secondary | ICD-10-CM
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
## 2023-06-01 ENCOUNTER — Encounter

## 2023-06-02 ENCOUNTER — Encounter: Payer: Self-pay | Admitting: Physical Therapy

## 2023-06-02 ENCOUNTER — Ambulatory Visit: Attending: Family Medicine | Admitting: Physical Therapy

## 2023-06-02 DIAGNOSIS — M25662 Stiffness of left knee, not elsewhere classified: Secondary | ICD-10-CM | POA: Diagnosis not present

## 2023-06-02 DIAGNOSIS — R279 Unspecified lack of coordination: Secondary | ICD-10-CM | POA: Diagnosis not present

## 2023-06-02 DIAGNOSIS — M25562 Pain in left knee: Secondary | ICD-10-CM | POA: Diagnosis not present

## 2023-06-02 DIAGNOSIS — G8929 Other chronic pain: Secondary | ICD-10-CM | POA: Insufficient documentation

## 2023-06-02 DIAGNOSIS — R262 Difficulty in walking, not elsewhere classified: Secondary | ICD-10-CM | POA: Insufficient documentation

## 2023-06-02 DIAGNOSIS — M6281 Muscle weakness (generalized): Secondary | ICD-10-CM | POA: Insufficient documentation

## 2023-06-02 DIAGNOSIS — M25551 Pain in right hip: Secondary | ICD-10-CM | POA: Insufficient documentation

## 2023-06-02 DIAGNOSIS — R293 Abnormal posture: Secondary | ICD-10-CM | POA: Insufficient documentation

## 2023-06-02 DIAGNOSIS — R252 Cramp and spasm: Secondary | ICD-10-CM | POA: Insufficient documentation

## 2023-06-02 DIAGNOSIS — M25661 Stiffness of right knee, not elsewhere classified: Secondary | ICD-10-CM | POA: Diagnosis not present

## 2023-06-02 DIAGNOSIS — M25561 Pain in right knee: Secondary | ICD-10-CM | POA: Insufficient documentation
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
## 2023-06-03 ENCOUNTER — Ambulatory Visit: Admitting: Physical Therapy

## 2023-06-03 DIAGNOSIS — G8929 Other chronic pain: Secondary | ICD-10-CM | POA: Diagnosis not present

## 2023-06-03 DIAGNOSIS — M25562 Pain in left knee: Secondary | ICD-10-CM | POA: Diagnosis not present

## 2023-06-03 DIAGNOSIS — M25661 Stiffness of right knee, not elsewhere classified: Secondary | ICD-10-CM

## 2023-06-03 DIAGNOSIS — M25551 Pain in right hip: Secondary | ICD-10-CM | POA: Diagnosis not present

## 2023-06-03 DIAGNOSIS — M6281 Muscle weakness (generalized): Secondary | ICD-10-CM

## 2023-06-03 DIAGNOSIS — M25662 Stiffness of left knee, not elsewhere classified: Secondary | ICD-10-CM

## 2023-06-03 DIAGNOSIS — R252 Cramp and spasm: Secondary | ICD-10-CM | POA: Diagnosis not present

## 2023-06-03 DIAGNOSIS — R262 Difficulty in walking, not elsewhere classified: Secondary | ICD-10-CM

## 2023-06-03 DIAGNOSIS — R293 Abnormal posture: Secondary | ICD-10-CM | POA: Diagnosis not present

## 2023-06-03 DIAGNOSIS — R279 Unspecified lack of coordination: Secondary | ICD-10-CM | POA: Diagnosis not present

## 2023-06-03 DIAGNOSIS — M25561 Pain in right knee: Secondary | ICD-10-CM | POA: Diagnosis not present
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
## 2023-06-08 ENCOUNTER — Ambulatory Visit

## 2023-06-09 ENCOUNTER — Ambulatory Visit: Payer: Self-pay | Admitting: Physical Therapy

## 2023-06-09 ENCOUNTER — Encounter: Payer: Self-pay | Admitting: Physical Therapy

## 2023-06-09 ENCOUNTER — Ambulatory Visit: Admitting: Physical Therapy

## 2023-06-09 DIAGNOSIS — G8929 Other chronic pain: Secondary | ICD-10-CM

## 2023-06-09 DIAGNOSIS — R279 Unspecified lack of coordination: Secondary | ICD-10-CM | POA: Diagnosis not present

## 2023-06-09 DIAGNOSIS — M6281 Muscle weakness (generalized): Secondary | ICD-10-CM | POA: Diagnosis not present

## 2023-06-09 DIAGNOSIS — M25661 Stiffness of right knee, not elsewhere classified: Secondary | ICD-10-CM | POA: Diagnosis not present

## 2023-06-09 DIAGNOSIS — R293 Abnormal posture: Secondary | ICD-10-CM

## 2023-06-09 DIAGNOSIS — M25551 Pain in right hip: Secondary | ICD-10-CM | POA: Diagnosis not present

## 2023-06-09 DIAGNOSIS — M25561 Pain in right knee: Secondary | ICD-10-CM | POA: Diagnosis not present

## 2023-06-09 DIAGNOSIS — R262 Difficulty in walking, not elsewhere classified: Secondary | ICD-10-CM

## 2023-06-09 DIAGNOSIS — M25562 Pain in left knee: Secondary | ICD-10-CM | POA: Diagnosis not present

## 2023-06-09 DIAGNOSIS — R252 Cramp and spasm: Secondary | ICD-10-CM

## 2023-06-09 DIAGNOSIS — M25662 Stiffness of left knee, not elsewhere classified: Secondary | ICD-10-CM
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
## 2023-06-10 ENCOUNTER — Encounter

## 2023-06-10 ENCOUNTER — Ambulatory Visit

## 2023-06-10 DIAGNOSIS — F332 Major depressive disorder, recurrent severe without psychotic features: Secondary | ICD-10-CM | POA: Diagnosis not present

## 2023-06-10 DIAGNOSIS — M25561 Pain in right knee: Secondary | ICD-10-CM | POA: Diagnosis not present

## 2023-06-10 DIAGNOSIS — R293 Abnormal posture: Secondary | ICD-10-CM

## 2023-06-10 DIAGNOSIS — M25562 Pain in left knee: Secondary | ICD-10-CM | POA: Diagnosis not present

## 2023-06-10 DIAGNOSIS — G8929 Other chronic pain: Secondary | ICD-10-CM | POA: Diagnosis not present

## 2023-06-10 DIAGNOSIS — M6281 Muscle weakness (generalized): Secondary | ICD-10-CM

## 2023-06-10 DIAGNOSIS — M25551 Pain in right hip: Secondary | ICD-10-CM

## 2023-06-10 DIAGNOSIS — R252 Cramp and spasm: Secondary | ICD-10-CM

## 2023-06-10 DIAGNOSIS — R279 Unspecified lack of coordination: Secondary | ICD-10-CM | POA: Diagnosis not present

## 2023-06-10 DIAGNOSIS — M25662 Stiffness of left knee, not elsewhere classified: Secondary | ICD-10-CM | POA: Diagnosis not present

## 2023-06-10 DIAGNOSIS — M25661 Stiffness of right knee, not elsewhere classified: Secondary | ICD-10-CM | POA: Diagnosis not present

## 2023-06-10 DIAGNOSIS — R262 Difficulty in walking, not elsewhere classified: Secondary | ICD-10-CM | POA: Diagnosis not present

## 2023-06-10 DIAGNOSIS — F4310 Post-traumatic stress disorder, unspecified: Secondary | ICD-10-CM | POA: Diagnosis not present

## 2023-06-10 NOTE — Patient Instructions (Addendum)
      Blood work was ordered.       Medications changes include :   None    A referral was ordered and someone will call you to schedule an appointment.     Return in about 6 months (around 12/11/2023).

## 2023-06-10 NOTE — Progress Notes (Signed)
 Subjective:    Patient ID: Darlene [name], female    DOB: Mar 11, 1975, 48 y.o.   MRN: [id number]     HPI Darlene [name] is here for follow up of her chronic medical problems.  Not using cpap - difficulties with machine  Medications and allergies reviewed with patient and updated if appropriate.  Current Outpatient Medications on File Prior to Visit  Medication Sig Dispense Refill  . ACCU-CHEK GUIDE TEST test strip USE TO check glucose 1-2 TIMES DAILY 100 strip 0  . amLODipine  (NORVASC ) 5 MG tablet TAKE 1 TABLET BY MOUTH EVERY MORNING 90 tablet 3  . blood glucose meter kit and supplies KIT One touch glucometer.   Use up to four times daily as directed. (FOR E11.9). 1 each 0  . celecoxib  (CELEBREX ) 200 MG capsule One to 2 tablets by mouth daily as needed for pain. 180 capsule 0  . fluticasone  (FLONASE ) 50 MCG/ACT nasal spray Place 2 sprays into both nostrils daily. 16 g 6  . Lancets (ONETOUCH DELICA PLUS LANCET33G) MISC USE AS DIRECTED TO check sugars 1-2 TIMES A day-will last 50 DAYS 100 each 0  . levocetirizine (XYZAL ) 5 MG tablet TAKE 1 TABLET BY MOUTH EVERY DAY AS NEEDED FOR allergies (cough/drainage) 90 tablet 3  . levonorgestrel  (MIRENA ) 20 MCG/DAY IUD To be placed in the GYN ONC office 1 each 0  . losartan -hydrochlorothiazide (HYZAAR) 100-25 MG tablet TAKE 1 TABLET BY MOUTH EVERY MORNING 90 tablet 3  . medroxyPROGESTERone  (PROVERA ) 10 MG tablet Take 1 tablet (10 mg total) by mouth daily. 30 tablet 6  . metFORMIN  (GLUCOPHAGE ) 1000 MG tablet TAKE 1 TABLET BY MOUTH 2 TIMES DAILY - IN THE MORNING & AT BEDTIME 180 tablet 3  . Multiple Vitamin (MULTIVITAMIN) capsule Take 1 capsule by mouth daily.    . PARoxetine  (PAXIL ) 30 MG tablet TAKE 1 TABLET BY MOUTH EVERY MORNING 90 tablet 1  . rosuvastatin  (CRESTOR ) 10 MG tablet TAKE 1 TABLET BY MOUTH EVERY MORNING 90 tablet 3  . spironolactone  (ALDACTONE ) 50 MG tablet TAKE 1 TABLET BY MOUTH EVERY MORNING 90 tablet 3  . TRULICITY  4.5 MG/0.5ML SOAJ  INJECT 4.5 MG into THE SKIN ONCE WEEKLY 2 mL 3   No current facility-administered medications on file prior to visit.     Review of Systems     Objective:  There were no vitals filed for this visit. BP Readings from Last 3 Encounters:  05/27/23 121/68  05/24/23 114/78  04/28/23 112/76   Wt Readings from Last 3 Encounters:  05/27/23 258 lb 12.8 oz (117.4 kg)  05/24/23 261 lb (118.4 kg)  04/28/23 262 lb (118.8 kg)   There is no height or weight on file to calculate BMI.    Physical Exam     Lab Results  Component Value Date   WBC 7.5 12/11/2022   HGB 14.2 12/11/2022   HCT 43.6 12/11/2022   PLT 304.0 12/11/2022   GLUCOSE 103 (H) 12/11/2022   CHOL 124 09/09/2022   TRIG 104.0 09/09/2022   HDL 38.50 (L) 09/09/2022   LDLDIRECT 148.6 02/13/2009   LDLCALC 65 09/09/2022   ALT 15 12/11/2022   AST 17 12/11/2022   NA 141 12/11/2022   K 3.9 12/11/2022   CL 100 12/11/2022   CREATININE 1.06 12/11/2022   BUN 13 12/11/2022   CO2 32 12/11/2022   TSH 2.43 12/11/2022   HGBA1C 5.9 12/11/2022   MICROALBUR 7.8 (H) 09/09/2022     Assessment & Plan:  See Problem List for Assessment and Plan of chronic medical problems.    This encounter was created in error - please disregard.
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
## 2023-06-11 ENCOUNTER — Encounter: Payer: 59 | Admitting: Internal Medicine

## 2023-06-11 DIAGNOSIS — G4733 Obstructive sleep apnea (adult) (pediatric): Secondary | ICD-10-CM

## 2023-06-11 DIAGNOSIS — E782 Mixed hyperlipidemia: Secondary | ICD-10-CM

## 2023-06-11 DIAGNOSIS — I10 Essential (primary) hypertension: Secondary | ICD-10-CM

## 2023-06-11 DIAGNOSIS — E1169 Type 2 diabetes mellitus with other specified complication: Secondary | ICD-10-CM

## 2023-06-11 NOTE — Assessment & Plan Note (Signed)
 Chronic Regular exercise and healthy diet encouraged Continue weight loss efforts Lab Results  Component Value Date   LDLCALC 65 09/09/2022   LDL well-controlled Continue Crestor 10 mg daily

## 2023-06-11 NOTE — Assessment & Plan Note (Signed)
 Chronic  Lab Results  Component Value Date   HGBA1C 5.9 12/11/2022   Sugars controlled Check A1c, urine microalbumin We stopped glimepiride last fall Continue Trulicity 4.5 mg weekly, metformin 1000 mg daily  Stressed weight loss Stressed regular exercise Stressed diabetic diet

## 2023-06-11 NOTE — Assessment & Plan Note (Signed)
 Chronic Blood pressure controlled CBC, CMP Continue weight loss efforts Continue amlodipine 5 mg daily, losartan-HCTZ 100-25 mg daily, spironolactone 50 mg daily

## 2023-06-11 NOTE — Assessment & Plan Note (Signed)
 Chronic Has not been using her CPAP due to difficulties with the machine

## 2023-06-11 NOTE — Assessment & Plan Note (Signed)
 Chronic Actively working on [employer] loss-Trulicity is helping Stressed importance of continuing to eat healthy, eating smaller portions, exercise regularly in addition to taking the Trulicity Discussed getting a good amount of protein, vegetables-stressed low sugars and carbs

## 2023-06-15 ENCOUNTER — Ambulatory Visit

## 2023-06-15 ENCOUNTER — Encounter: Payer: Self-pay | Admitting: Internal Medicine

## 2023-06-15 NOTE — Progress Notes (Signed)
 Subjective:    Patient ID: Darlene [name], female    DOB: Mar 11, 1975, 48 y.o.   MRN: [id number]     HPI Krissi is here for follow up of her chronic medical problems.  Not using cpap - difficulties with machine  Medications and allergies reviewed with patient and updated if appropriate.  Current Outpatient Medications on File Prior to Visit  Medication Sig Dispense Refill  . ACCU-CHEK GUIDE TEST test strip USE TO check glucose 1-2 TIMES DAILY 100 strip 0  . amLODipine  (NORVASC ) 5 MG tablet TAKE 1 TABLET BY MOUTH EVERY MORNING 90 tablet 3  . blood glucose meter kit and supplies KIT One touch glucometer.   Use up to four times daily as directed. (FOR E11.9). 1 each 0  . celecoxib  (CELEBREX ) 200 MG capsule One to 2 tablets by mouth daily as needed for pain. 180 capsule 0  . fluticasone  (FLONASE ) 50 MCG/ACT nasal spray Place 2 sprays into both nostrils daily. 16 g 6  . Lancets (ONETOUCH DELICA PLUS LANCET33G) MISC USE AS DIRECTED TO check sugars 1-2 TIMES A day-will last 50 DAYS 100 each 0  . levocetirizine (XYZAL ) 5 MG tablet TAKE 1 TABLET BY MOUTH EVERY DAY AS NEEDED FOR allergies (cough/drainage) 90 tablet 3  . levonorgestrel  (MIRENA ) 20 MCG/DAY IUD To be placed in the GYN ONC office 1 each 0  . losartan -hydrochlorothiazide (HYZAAR) 100-25 MG tablet TAKE 1 TABLET BY MOUTH EVERY MORNING 90 tablet 3  . medroxyPROGESTERone  (PROVERA ) 10 MG tablet Take 1 tablet (10 mg total) by mouth daily. 30 tablet 6  . metFORMIN  (GLUCOPHAGE ) 1000 MG tablet TAKE 1 TABLET BY MOUTH 2 TIMES DAILY - IN THE MORNING & AT BEDTIME 180 tablet 3  . Multiple Vitamin (MULTIVITAMIN) capsule Take 1 capsule by mouth daily.    . PARoxetine  (PAXIL ) 30 MG tablet TAKE 1 TABLET BY MOUTH EVERY MORNING 90 tablet 1  . rosuvastatin  (CRESTOR ) 10 MG tablet TAKE 1 TABLET BY MOUTH EVERY MORNING 90 tablet 3  . spironolactone  (ALDACTONE ) 50 MG tablet TAKE 1 TABLET BY MOUTH EVERY MORNING 90 tablet 3  . TRULICITY  4.5 MG/0.5ML SOAJ  INJECT 4.5 MG into THE SKIN ONCE WEEKLY 2 mL 3   No current facility-administered medications on file prior to visit.     Review of Systems     Objective:  There were no vitals filed for this visit. BP Readings from Last 3 Encounters:  05/27/23 121/68  05/24/23 114/78  04/28/23 112/76   Wt Readings from Last 3 Encounters:  05/27/23 258 lb 12.8 oz (117.4 kg)  05/24/23 261 lb (118.4 kg)  04/28/23 262 lb (118.8 kg)   There is no height or weight on file to calculate BMI.    Physical Exam     Lab Results  Component Value Date   WBC 7.5 12/11/2022   HGB 14.2 12/11/2022   HCT 43.6 12/11/2022   PLT 304.0 12/11/2022   GLUCOSE 103 (H) 12/11/2022   CHOL 124 09/09/2022   TRIG 104.0 09/09/2022   HDL 38.50 (L) 09/09/2022   LDLDIRECT 148.6 02/13/2009   LDLCALC 65 09/09/2022   ALT 15 12/11/2022   AST 17 12/11/2022   NA 141 12/11/2022   K 3.9 12/11/2022   CL 100 12/11/2022   CREATININE 1.06 12/11/2022   BUN 13 12/11/2022   CO2 32 12/11/2022   TSH 2.43 12/11/2022   HGBA1C 5.9 12/11/2022   MICROALBUR 7.8 (H) 09/09/2022     Assessment & Plan:  See Problem List for Assessment and Plan of chronic medical problems.    This encounter was created in error - please disregard.

## 2023-06-15 NOTE — Patient Instructions (Addendum)
      Blood work was ordered.       Medications changes include :   None    A referral was ordered and someone will call you to schedule an appointment.     Return in about 6 months (around 12/16/2023) for Physical Exam.
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
## 2023-06-16 ENCOUNTER — Ambulatory Visit: Admitting: Physical Therapy

## 2023-06-16 ENCOUNTER — Encounter: Admitting: Internal Medicine

## 2023-06-16 DIAGNOSIS — M25661 Stiffness of right knee, not elsewhere classified: Secondary | ICD-10-CM

## 2023-06-16 DIAGNOSIS — R279 Unspecified lack of coordination: Secondary | ICD-10-CM

## 2023-06-16 DIAGNOSIS — E782 Mixed hyperlipidemia: Secondary | ICD-10-CM

## 2023-06-16 DIAGNOSIS — M25551 Pain in right hip: Secondary | ICD-10-CM

## 2023-06-16 DIAGNOSIS — M25662 Stiffness of left knee, not elsewhere classified: Secondary | ICD-10-CM

## 2023-06-16 DIAGNOSIS — G8929 Other chronic pain: Secondary | ICD-10-CM

## 2023-06-16 DIAGNOSIS — R293 Abnormal posture: Secondary | ICD-10-CM

## 2023-06-16 DIAGNOSIS — I10 Essential (primary) hypertension: Secondary | ICD-10-CM

## 2023-06-16 DIAGNOSIS — G4733 Obstructive sleep apnea (adult) (pediatric): Secondary | ICD-10-CM

## 2023-06-16 DIAGNOSIS — E1169 Type 2 diabetes mellitus with other specified complication: Secondary | ICD-10-CM

## 2023-06-16 DIAGNOSIS — R252 Cramp and spasm: Secondary | ICD-10-CM

## 2023-06-16 DIAGNOSIS — F3289 Other specified depressive episodes: Secondary | ICD-10-CM

## 2023-06-16 DIAGNOSIS — R262 Difficulty in walking, not elsewhere classified: Secondary | ICD-10-CM

## 2023-06-16 DIAGNOSIS — M6281 Muscle weakness (generalized): Secondary | ICD-10-CM

## 2023-06-16 NOTE — Assessment & Plan Note (Signed)
 Chronic Blood pressure controlled CBC, CMP Continue weight loss efforts Stressed healthy diet, regular exercise Continue amlodipine 5 mg daily, losartan-HCTZ 100-25 mg daily, spironolactone 50 mg daily

## 2023-06-16 NOTE — Assessment & Plan Note (Signed)
 Chronic Regular exercise and healthy diet encouraged Continue weight loss efforts Lab Results  Component Value Date   LDLCALC 65 09/09/2022   LDL well-controlled Continue Crestor 10 mg daily

## 2023-06-16 NOTE — Assessment & Plan Note (Signed)
 Chronic  Lab Results  Component Value Date   HGBA1C 5.9 12/11/2022   Sugars controlled Check A1c, urine microalbumin We stopped glimepiride last fall Continue Trulicity 4.5 mg weekly, metformin 1000 mg daily  Stressed weight loss Stressed regular exercise Stressed diabetic diet

## 2023-06-16 NOTE — Assessment & Plan Note (Signed)
 Chronic Has not been using her CPAP due to difficulties with the machine Encouraged follow-up with neurology

## 2023-06-16 NOTE — Assessment & Plan Note (Signed)
Chronic Stable Continue paxil 30 mg daily  

## 2023-06-17 ENCOUNTER — Encounter: Admitting: Physical Therapy

## 2023-06-22 ENCOUNTER — Ambulatory Visit: Admitting: Physical Therapy

## 2023-06-23 ENCOUNTER — Ambulatory Visit: Admitting: Physical Therapy

## 2023-06-24 ENCOUNTER — Encounter

## 2023-06-24 ENCOUNTER — Telehealth (HOSPITAL_COMMUNITY): Payer: Self-pay | Admitting: Cardiology

## 2023-06-24 ENCOUNTER — Ambulatory Visit (HOSPITAL_COMMUNITY)

## 2023-06-24 NOTE — Telephone Encounter (Signed)
 Patient called and cancelled echocardiogram for 06/24/23 due to her insurance is in limbo. She will call back to reschedule when she has it all taken care of.  Order will be removed from the echo WQ. If patient calls back to reschedule the echo we can reinstate the order. Thank you.

## 2023-06-25 DIAGNOSIS — F4310 Post-traumatic stress disorder, unspecified: Secondary | ICD-10-CM | POA: Diagnosis not present

## 2023-06-25 DIAGNOSIS — F332 Major depressive disorder, recurrent severe without psychotic features: Secondary | ICD-10-CM | POA: Diagnosis not present

## 2023-06-29 ENCOUNTER — Ambulatory Visit: Admitting: Physical Therapy

## 2023-06-29 DIAGNOSIS — F4310 Post-traumatic stress disorder, unspecified: Secondary | ICD-10-CM | POA: Diagnosis not present

## 2023-06-29 DIAGNOSIS — F332 Major depressive disorder, recurrent severe without psychotic features: Secondary | ICD-10-CM | POA: Diagnosis not present

## 2023-06-30 ENCOUNTER — Ambulatory Visit: Admitting: Physical Therapy

## 2023-07-06 ENCOUNTER — Other Ambulatory Visit: Payer: Self-pay

## 2023-07-06 DIAGNOSIS — I119 Hypertensive heart disease without heart failure: Secondary | ICD-10-CM

## 2023-07-06 NOTE — Progress Notes (Signed)
 Echo order replaced due to order being cancelled.

## 2023-07-13 ENCOUNTER — Ambulatory Visit: Attending: Family Medicine | Admitting: Physical Therapy

## 2023-07-13 DIAGNOSIS — R252 Cramp and spasm: Secondary | ICD-10-CM | POA: Insufficient documentation

## 2023-07-13 DIAGNOSIS — R262 Difficulty in walking, not elsewhere classified: Secondary | ICD-10-CM | POA: Insufficient documentation

## 2023-07-13 DIAGNOSIS — M6281 Muscle weakness (generalized): Secondary | ICD-10-CM | POA: Insufficient documentation

## 2023-07-13 DIAGNOSIS — M25662 Stiffness of left knee, not elsewhere classified: Secondary | ICD-10-CM | POA: Insufficient documentation

## 2023-07-13 DIAGNOSIS — M25661 Stiffness of right knee, not elsewhere classified: Secondary | ICD-10-CM | POA: Insufficient documentation
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
## 2023-07-16 DIAGNOSIS — F4310 Post-traumatic stress disorder, unspecified: Secondary | ICD-10-CM | POA: Diagnosis not present

## 2023-07-16 DIAGNOSIS — F332 Major depressive disorder, recurrent severe without psychotic features: Secondary | ICD-10-CM | POA: Diagnosis not present

## 2023-07-21 ENCOUNTER — Ambulatory Visit

## 2023-07-21 ENCOUNTER — Telehealth: Payer: Self-pay

## 2023-07-21 NOTE — Telephone Encounter (Signed)
 Patient did not show for appointment today.  Call placed to inquire about missed appt.  No answer.  Left message on voice mail to let her know of missed appointment and to call and let us  know if she will make her next appointment.  Phone number left for patient to call back.

## 2023-07-22 NOTE — Progress Notes (Unsigned)
 [facility] [address] [place] [address] Phone: [phone] Subjective:   Delwyn Filippo, am serving as a scribe for Dr. Ronnell Coins.  I'm seeing this patient by the request  of:  Colene Dauphin, MD  CC: Right hip and bilateral knee pain follow-up  IEP:[id number]  04/28/2023 Patient given injection and tolerated the procedure well, discussed icing regimen and home exercises. Increase activity slowly. Increase activity slowly otherwise. Patient will also start with formal physical therapy which patient has done well with with the knee somewhat. New problem and differential does include lumbar radicular symptoms. Hopefully patient does well though. Follow-up again 6 to 8 weeks otherwise. 3 months of Celebrex  200 mg tablets prescribed again.  Known arthritic changes of the knees.  Is doing better with the physical therapy and would like to continue some.  New order put in for this.  Discussed icing regimen and home exercises, discussed which activities to do and which ones to avoid.  Follow-up with me again in 10 to 12 weeks     Updated 07/27/2023 Keayra E Suto is a 48 y.o. female coming in with complaint of R hip and B knee pain. Patient states that she has been doing to PT and taking celebrex  which has been helpful.        Past Medical History:  Diagnosis Date   Arthritis    hands, low back   Depression    Environmental and seasonal allergies    Gallstones    05-05-2022  per pt no issues currently   Genital herpes    GERD (gastroesophageal reflux disease)    05-05-2022  per pt watches diet , not meds   Hyperlipidemia    Hypertension    Insomnia    Menorrhagia    Mixed incontinence urge and stress    Morbid obesity (HCC)    OSA (obstructive sleep apnea) 2011   (05-05-2022  per pt tried  cpap but into[place] and did not follow up)  first dx 2011--- last sleep study done by dr dohmeier 01-17-2020 moderate to severe osa,  cpap on back  order ,  finally received 04/ 2022   Type 2 diabetes mellitus (HCC)    followed by pcp;   (05-05-2022  per pt check blood sugar 3-4 times daily,  fasting average blood sugar 108-115)   Uterine leiomyoma    Vitiligo    Wears contact lenses    Past Surgical History:  Procedure Laterality Date   COLONOSCOPY WITH PROPOFOL  N/A 09/20/2020   Procedure: COLONOSCOPY WITH PROPOFOL ;  Surgeon: Alvis Jourdain, MD;  Location: [facility];  Service: Endoscopy;  Laterality: N/A;   DILATATION & CURETTAGE/HYSTEROSCOPY WITH MYOSURE N/A 05/07/2022   Procedure: DILATATION & CURETTAGE/HYSTEROSCOPY WITH MYOSURE;  Surgeon: Loa Riling, DO;  Location: [facility];  Service: Gynecology;  Laterality: N/A;   WISDOM TOOTH EXTRACTION     Social History   Socioeconomic History   Marital status: Single    Spouse name: Not on file   Number of children: 0   Years of education: some college   Highest education level: Associate degree: occupational, Scientist, product/process development, or vocational program  Occupational History   Occupation: customer service  Tobacco Use   Smoking status: Never   Smokeless tobacco: Never  Vaping Use   Vaping status: Former   Substances: THC  Substance and Sexual Activity   Alcohol use: Not Currently    Comment: rare   Drug use: Not Currently  Types: Marijuana   Sexual activity: Yes    Birth control/protection: None  Other Topics Concern   Not on file  Social History Narrative   Lives with her mother.   Right-handed.   Caffeine use: 1-2 cups per day.   Social Drivers of Corporate investment banker Strain: High Risk (04/01/2023)   Overall Financial Resource Strain (CARDIA)    Difficulty of Paying Living Expenses: Hard  Food Insecurity: No Food Insecurity (04/01/2023)   Hunger Vital Sign    Worried About Running Out of Food in the Last Year: Never true    Ran Out of Food in the Last Year: Never true  Transportation Needs: Unknown (04/01/2023)   PRAPARE - Therapist, art (Medical): Not on file    Lack of Transportation (Non-Medical): No  Physical Activity: Inactive (04/01/2023)   Exercise Vital Sign    Days of Exercise per Week: 2 days    Minutes of Exercise per Session: 0 min  Stress: Stress Concern Present (04/01/2023)   [employer] of Occupational Health - Occupational Stress Questionnaire    Feeling of Stress : Rather much  Social Connections: Socially Isolated (04/01/2023)   Social Connection and Isolation Panel [NHANES]    Frequency of Communication with Friends and Family: More than three times a week    Frequency of Social Gatherings with Friends and Family: Patient declined    Attends Religious Services: Never    Database administrator or Organizations: No    Attends Engineer, structural: Not on file    Marital Status: Never married   Allergies  Allergen Reactions   Cat Dander Other (See Comments)    Difficulty Breathing, Eye irritation   Yeast-Derived Drug Products Other (See Comments)    Per allergy test   Seasonal Ic [Octacosanol]     Pollen- Watery itchy eyes    Chlorpheniramine     Tussionex - caused itching   Apple Rash   Latex Rash   Rice Rash   Tomato Rash and Other (See Comments)    Itchy throat (rash around mouth)   Wound Dressing Adhesive Rash   Family History  Problem Relation Age of Onset   Breast cancer Mother    Ovarian cysts Mother    Hypertension Mother    Diabetes Mother    Stroke Father    Diabetes Father    Cancer Father        bone marrow   Breast cancer Other    Colon cancer Neg Hx    Endometrial cancer Neg Hx    Pancreatic cancer Neg Hx    Prostate cancer Neg Hx     Current Outpatient Medications (Endocrine & Metabolic):    medroxyPROGESTERone  (PROVERA ) 10 MG tablet, Take 1 tablet (10 mg total) by mouth daily.   metFORMIN  (GLUCOPHAGE ) 1000 MG tablet, TAKE 1 TABLET BY MOUTH 2 TIMES DAILY - IN THE MORNING & AT BEDTIME   TRULICITY  4.5 MG/0.5ML SOAJ, INJECT 4.5  MG into THE SKIN ONCE WEEKLY   levonorgestrel  (MIRENA ) 20 MCG/DAY IUD, To be placed in the GYN ONC office  Current Outpatient Medications (Cardiovascular):    amLODipine  (NORVASC ) 5 MG tablet, TAKE 1 TABLET BY MOUTH EVERY MORNING   losartan -hydrochlorothiazide (HYZAAR) 100-25 MG tablet, TAKE 1 TABLET BY MOUTH EVERY MORNING   rosuvastatin  (CRESTOR ) 10 MG tablet, TAKE 1 TABLET BY MOUTH EVERY MORNING   spironolactone  (ALDACTONE ) 50 MG tablet, TAKE 1 TABLET BY MOUTH EVERY MORNING  Current  Outpatient Medications (Respiratory):    fluticasone  (FLONASE ) 50 MCG/ACT nasal spray, Place 2 sprays into both nostrils daily.   levocetirizine (XYZAL ) 5 MG tablet, TAKE 1 TABLET BY MOUTH EVERY DAY AS NEEDED FOR allergies (cough/drainage)  Current Outpatient Medications (Analgesics):    celecoxib  (CELEBREX ) 200 MG capsule, One to 2 tablets by mouth daily as needed for pain.   Current Outpatient Medications (Other):    ACCU-CHEK GUIDE TEST test strip, USE TO check glucose 1-2 TIMES DAILY   blood glucose meter kit and supplies KIT, One touch glucometer.   Use up to four times daily as directed. (FOR E11.9).   Lancets (ONETOUCH DELICA PLUS LANCET33G) MISC, USE AS DIRECTED TO check sugars 1-2 TIMES A day-will last 50 DAYS   PARoxetine  (PAXIL ) 30 MG tablet, TAKE 1 TABLET BY MOUTH EVERY MORNING   Reviewed prior external information including notes and imaging from  primary care provider As well as notes that were available from care everywhere and other healthcare systems.  Past medical history, social, surgical and family history all reviewed in electronic medical record.  No pertanent information unless stated regarding to the chief complaint.   Review of Systems:  No headache, visual changes, nausea, vomiting, diarrhea, constipation, dizziness, abdominal pain, skin rash, fevers, chills, night sweats, weight loss, swollen lymph nodes, body aches, joint swelling, chest pain, shortness of breath, mood changes.  POSITIVE muscle aches  Objective  Blood pressure 116/72, pulse 67, height 5\' 4"  (1.626 m), weight 263 lb (119.3 kg), SpO2 97%.   General: No apparent distress alert and oriented x3 mood and affect normal, dressed appropriately.  HEENT: Pupils equal, extraocular movements intact  Respiratory: Patient's speak in full sentences and does not appear short of breath  Cardiovascular: No lower extremity edema, non tender, no erythema  Bilateral knee exam shows arthritic changes but no significant swelling at the moment.  Able to move the knees significantly better.  Right hip exam shows still has impingement noted of the right hip minorly.  Improvement in range of motion also noted.  Patient's finger on the index finger of the right hand does have a ganglion cyst noted.  Procedure: Real-time Ultrasound Guided Injection of right finger ganglion cyst Device: GE Logiq Q7 Ultrasound guided injection is preferred based studies that show increased duration, increased effect, greater accuracy, decreased procedural pain, increased response rate, and decreased cost with ultrasound guided versus blind injection.  Verbal informed consent obtained.  Time-out conducted.  Noted no overlying erythema, induration, or other signs of local infection.  Skin prepped in a sterile fashion.  Local anesthesia: Topical Ethyl chloride.  With sterile technique and under real time ultrasound guidance: With an 18-gauge 1 inch needle patient was injected with 0.5 cc of 0.5% Marcaine and then when removed needle significant amount of jellylike fluid removed.  Minimal blood loss. Completed without difficulty  Pain immediately resolved suggesting accurate placement of the medication.  Advised to call if fevers/chills, erythema, induration, drainage, or persistent bleeding.  Images saved Impression: Technically successful ultrasound guided injection.    Impression and Recommendations:    The above documentation has been  reviewed and is accurate and complete Ximena Todaro M Steen Bisig, DO

## 2023-07-27 ENCOUNTER — Encounter: Payer: Self-pay | Admitting: Family Medicine

## 2023-07-27 ENCOUNTER — Ambulatory Visit (INDEPENDENT_AMBULATORY_CARE_PROVIDER_SITE_OTHER): Payer: 59 | Admitting: Family Medicine

## 2023-07-27 VITALS — BP 116/72 | HR 67 | Ht 64.0 in | Wt 263.0 lb

## 2023-07-27 DIAGNOSIS — M170 Bilateral primary osteoarthritis of knee: Secondary | ICD-10-CM | POA: Diagnosis not present

## 2023-07-27 DIAGNOSIS — M67449 Ganglion, unspecified hand: Secondary | ICD-10-CM | POA: Diagnosis not present

## 2023-07-27 NOTE — Patient Instructions (Addendum)
See me again in 3-4 months 

## 2023-07-27 NOTE — Assessment & Plan Note (Signed)
 Discussed HEP, continue weight loss at the patient is going for 250 pounds's.  Hopeful that she will be there at her next follow-up.  Follow-up with me again in 6 to 8 weeks.

## 2023-07-28 ENCOUNTER — Ambulatory Visit

## 2023-07-28 DIAGNOSIS — M25661 Stiffness of right knee, not elsewhere classified: Secondary | ICD-10-CM | POA: Diagnosis not present

## 2023-07-28 DIAGNOSIS — R252 Cramp and spasm: Secondary | ICD-10-CM | POA: Diagnosis not present

## 2023-07-28 DIAGNOSIS — M25662 Stiffness of left knee, not elsewhere classified: Secondary | ICD-10-CM | POA: Diagnosis not present

## 2023-07-28 DIAGNOSIS — M6281 Muscle weakness (generalized): Secondary | ICD-10-CM

## 2023-07-28 DIAGNOSIS — R262 Difficulty in walking, not elsewhere classified: Secondary | ICD-10-CM
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
## 2023-08-03 ENCOUNTER — Ambulatory Visit: Attending: Family Medicine

## 2023-08-03 DIAGNOSIS — G8929 Other chronic pain: Secondary | ICD-10-CM

## 2023-08-03 DIAGNOSIS — R252 Cramp and spasm: Secondary | ICD-10-CM | POA: Diagnosis not present

## 2023-08-03 DIAGNOSIS — M25661 Stiffness of right knee, not elsewhere classified: Secondary | ICD-10-CM | POA: Diagnosis not present

## 2023-08-03 DIAGNOSIS — R293 Abnormal posture: Secondary | ICD-10-CM

## 2023-08-03 DIAGNOSIS — R279 Unspecified lack of coordination: Secondary | ICD-10-CM | POA: Diagnosis not present

## 2023-08-03 DIAGNOSIS — M6281 Muscle weakness (generalized): Secondary | ICD-10-CM | POA: Diagnosis not present

## 2023-08-03 DIAGNOSIS — M25562 Pain in left knee: Secondary | ICD-10-CM | POA: Insufficient documentation

## 2023-08-03 DIAGNOSIS — M25662 Stiffness of left knee, not elsewhere classified: Secondary | ICD-10-CM | POA: Diagnosis not present

## 2023-08-03 DIAGNOSIS — M25561 Pain in right knee: Secondary | ICD-10-CM | POA: Diagnosis not present

## 2023-08-03 DIAGNOSIS — M25551 Pain in right hip: Secondary | ICD-10-CM

## 2023-08-03 DIAGNOSIS — R262 Difficulty in walking, not elsewhere classified: Secondary | ICD-10-CM | POA: Diagnosis not present
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
## 2023-08-04 ENCOUNTER — Encounter: Payer: Self-pay | Admitting: Physical Therapy

## 2023-08-04 ENCOUNTER — Ambulatory Visit: Admitting: Physical Therapy

## 2023-08-04 DIAGNOSIS — R293 Abnormal posture: Secondary | ICD-10-CM

## 2023-08-04 DIAGNOSIS — M25562 Pain in left knee: Secondary | ICD-10-CM | POA: Diagnosis not present

## 2023-08-04 DIAGNOSIS — R252 Cramp and spasm: Secondary | ICD-10-CM

## 2023-08-04 DIAGNOSIS — R262 Difficulty in walking, not elsewhere classified: Secondary | ICD-10-CM | POA: Diagnosis not present

## 2023-08-04 DIAGNOSIS — M25561 Pain in right knee: Secondary | ICD-10-CM | POA: Diagnosis not present

## 2023-08-04 DIAGNOSIS — M25551 Pain in right hip: Secondary | ICD-10-CM | POA: Diagnosis not present

## 2023-08-04 DIAGNOSIS — R279 Unspecified lack of coordination: Secondary | ICD-10-CM | POA: Diagnosis not present

## 2023-08-04 DIAGNOSIS — M25662 Stiffness of left knee, not elsewhere classified: Secondary | ICD-10-CM | POA: Diagnosis not present

## 2023-08-04 DIAGNOSIS — M25661 Stiffness of right knee, not elsewhere classified: Secondary | ICD-10-CM

## 2023-08-04 DIAGNOSIS — M6281 Muscle weakness (generalized): Secondary | ICD-10-CM | POA: Diagnosis not present

## 2023-08-04 DIAGNOSIS — G8929 Other chronic pain: Secondary | ICD-10-CM | POA: Diagnosis not present
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
## 2023-08-05 DIAGNOSIS — F4310 Post-traumatic stress disorder, unspecified: Secondary | ICD-10-CM | POA: Diagnosis not present

## 2023-08-05 DIAGNOSIS — F332 Major depressive disorder, recurrent severe without psychotic features: Secondary | ICD-10-CM | POA: Diagnosis not present

## 2023-08-06 ENCOUNTER — Ambulatory Visit (HOSPITAL_COMMUNITY)
Admission: RE | Admit: 2023-08-06 | Discharge: 2023-08-06 | Disposition: A | Discharge status: Home / Self Care | Source: Ambulatory Visit | Attending: Internal Medicine | Admitting: Internal Medicine

## 2023-08-06 DIAGNOSIS — I119 Hypertensive heart disease without heart failure: Secondary | ICD-10-CM

## 2023-08-06 LAB — ECHOCARDIOGRAM COMPLETE
Area-P 1/2: 3.74 cm2
S' Lateral: 2.2 cm

## 2023-08-09 ENCOUNTER — Ambulatory Visit

## 2023-08-11 ENCOUNTER — Encounter: Payer: Self-pay | Admitting: Physical Therapy

## 2023-08-11 ENCOUNTER — Ambulatory Visit: Admitting: Physical Therapy

## 2023-08-11 ENCOUNTER — Ambulatory Visit: Payer: Self-pay | Admitting: Cardiology
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
## 2023-08-12 DIAGNOSIS — F332 Major depressive disorder, recurrent severe without psychotic features: Secondary | ICD-10-CM | POA: Diagnosis not present

## 2023-08-12 DIAGNOSIS — F4310 Post-traumatic stress disorder, unspecified: Secondary | ICD-10-CM | POA: Diagnosis not present

## 2023-08-18 ENCOUNTER — Ambulatory Visit: Admitting: Physical Therapy

## 2023-08-20 ENCOUNTER — Ambulatory Visit: Admitting: Physical Therapy

## 2023-08-20 DIAGNOSIS — R252 Cramp and spasm: Secondary | ICD-10-CM | POA: Diagnosis not present

## 2023-08-20 DIAGNOSIS — M25661 Stiffness of right knee, not elsewhere classified: Secondary | ICD-10-CM

## 2023-08-20 DIAGNOSIS — M25551 Pain in right hip: Secondary | ICD-10-CM | POA: Diagnosis not present

## 2023-08-20 DIAGNOSIS — R293 Abnormal posture: Secondary | ICD-10-CM | POA: Diagnosis not present

## 2023-08-20 DIAGNOSIS — M25562 Pain in left knee: Secondary | ICD-10-CM | POA: Diagnosis not present

## 2023-08-20 DIAGNOSIS — M25662 Stiffness of left knee, not elsewhere classified: Secondary | ICD-10-CM | POA: Diagnosis not present

## 2023-08-20 DIAGNOSIS — R262 Difficulty in walking, not elsewhere classified: Secondary | ICD-10-CM

## 2023-08-20 DIAGNOSIS — M6281 Muscle weakness (generalized): Secondary | ICD-10-CM

## 2023-08-20 DIAGNOSIS — G8929 Other chronic pain: Secondary | ICD-10-CM | POA: Diagnosis not present

## 2023-08-20 DIAGNOSIS — R279 Unspecified lack of coordination: Secondary | ICD-10-CM | POA: Diagnosis not present

## 2023-08-20 DIAGNOSIS — M25561 Pain in right knee: Secondary | ICD-10-CM | POA: Diagnosis not present
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
## 2023-08-25 ENCOUNTER — Ambulatory Visit: Admitting: Physical Therapy

## 2023-08-25 ENCOUNTER — Other Ambulatory Visit: Payer: Self-pay | Admitting: Internal Medicine

## 2023-08-26 ENCOUNTER — Encounter: Payer: Self-pay | Admitting: Gynecologic Oncology

## 2023-08-27 ENCOUNTER — Ambulatory Visit: Admitting: Physical Therapy

## 2023-08-27 ENCOUNTER — Inpatient Hospital Stay: Attending: Gynecologic Oncology | Admitting: Gynecologic Oncology

## 2023-08-27 VITALS — BP 114/70 | HR 74 | Temp 98.7°F | Resp 17 | Ht 64.0 in | Wt 270.4 lb

## 2023-08-27 DIAGNOSIS — M25662 Stiffness of left knee, not elsewhere classified: Secondary | ICD-10-CM | POA: Diagnosis not present

## 2023-08-27 DIAGNOSIS — M25562 Pain in left knee: Secondary | ICD-10-CM | POA: Diagnosis not present

## 2023-08-27 DIAGNOSIS — N8502 Endometrial intraepithelial neoplasia [EIN]: Secondary | ICD-10-CM | POA: Diagnosis not present

## 2023-08-27 DIAGNOSIS — M6281 Muscle weakness (generalized): Secondary | ICD-10-CM

## 2023-08-27 DIAGNOSIS — M25551 Pain in right hip: Secondary | ICD-10-CM | POA: Diagnosis not present

## 2023-08-27 DIAGNOSIS — M25561 Pain in right knee: Secondary | ICD-10-CM | POA: Diagnosis not present

## 2023-08-27 DIAGNOSIS — G8929 Other chronic pain: Secondary | ICD-10-CM | POA: Diagnosis not present

## 2023-08-27 DIAGNOSIS — Z79890 Hormone replacement therapy (postmenopausal): Secondary | ICD-10-CM | POA: Insufficient documentation

## 2023-08-27 DIAGNOSIS — C541 Malignant neoplasm of endometrium: Secondary | ICD-10-CM | POA: Insufficient documentation

## 2023-08-27 DIAGNOSIS — Z975 Presence of (intrauterine) contraceptive device: Secondary | ICD-10-CM | POA: Diagnosis not present

## 2023-08-27 DIAGNOSIS — R262 Difficulty in walking, not elsewhere classified: Secondary | ICD-10-CM | POA: Diagnosis not present

## 2023-08-27 DIAGNOSIS — M25661 Stiffness of right knee, not elsewhere classified: Secondary | ICD-10-CM | POA: Diagnosis not present

## 2023-08-27 DIAGNOSIS — Z6841 Body Mass Index (BMI) 40.0 and over, adult: Secondary | ICD-10-CM | POA: Diagnosis not present

## 2023-08-27 DIAGNOSIS — R279 Unspecified lack of coordination: Secondary | ICD-10-CM | POA: Diagnosis not present

## 2023-08-27 DIAGNOSIS — R293 Abnormal posture: Secondary | ICD-10-CM | POA: Diagnosis not present

## 2023-08-27 DIAGNOSIS — R252 Cramp and spasm: Secondary | ICD-10-CM | POA: Diagnosis not present

## 2023-08-27 NOTE — Progress Notes (Signed)
 Gynecologic Oncology Return Clinic Visit  08/27/23  Reason for Visit: surveillance   Treatment History: Patient was seen in September 2021 and at that time had a thickened endometrial lining noted on imaging.  She opted to undergo hysteroscopy with D&C rather than endometrial biopsy but did not get this scheduled.  She was started on Provera , which she took for 1 month to regulate her menses, but then discontinued the medication.  Repeat ultrasound in 11/2020 showed an endometrial lining of 9 mm.  Patient was seen again in October 2023 and noted menstrual cycle that had been ongoing for a month.  She has a history of abnormal cycles, will often skip multiple months at a time.  When she does have menses, she will bleed for a prolonged period.  Office pelvic ultrasound in October 2023 showed a uterus measuring 8.7 x 5.2 x 5.6 cm with a 2.1 cm thickened and heterogenous appearing endometrium, increased vascularity noted with suspected polyps.  A stable 1.8 cm fibroid.  Ovaries noted to be normal in appearance.  Given appearance of polyps, recommendation was made for hysteroscopy with D&C.   D&C and polypectomy from 05/07/22 showed endometrioid carcinoma, grade 1. MMR itnact, p53 wildtype.   AMH on 3/15: 0.037   MRI pelvis 05/19/22: 7 mm small, focal nodular appearing endometrial lesion in the lower uterine segment.  No evidence of myometrial invasion.  Normal ovaries. No adenopathy or other evidence of metastatic disease.   Mirena  IUD placed on 05/22/22.   Endometrial biopsy on 08/28/2022: Benign inactive endometrium, benign cervical glandular mucosa.  No hyperplasia or malignancy.   Endometrial biopsy on 11/26/2022: Endometrium with focal complex nonatypical hyperplasia, mucinous metaplasia and stromal pseudodecidualization consistent with hormone treatment effect.   Endometrial biopsy on 04/14/2023: EIN with stromal progestational effects.  No definitive low-grade endometrial cancer.  Interval  History: Doing well.  Denies basically any intermenstrual bleeding.  Thinks she had a period last month.  Will occasionally see pink when she wipes, denies any blood on pads or underwear.  Since starting Provera , notes some increased moodiness and PMS symptoms.  Past Medical/Surgical History: Past Medical History:  Diagnosis Date   Arthritis    hands, low back   Depression    Environmental and seasonal allergies    Gallstones    05-05-2022  per pt no issues currently   Genital herpes    GERD (gastroesophageal reflux disease)    05-05-2022  per pt watches diet , not meds   Hyperlipidemia    Hypertension    Insomnia    Menorrhagia    Mixed incontinence urge and stress    Morbid obesity (HCC)    OSA (obstructive sleep apnea) 2011   (05-05-2022  per pt tried  cpap but intolerent and did not follow up)  first dx 2011--- last sleep study done by dr dohmeier 01-17-2020 moderate to severe osa,  cpap on back order ,  finally received 04/ 2022   Type 2 diabetes mellitus (HCC)    followed by pcp;   (05-05-2022  per pt check blood sugar 3-4 times daily,  fasting average blood sugar 108-115)   Uterine leiomyoma    Vitiligo    Wears contact lenses     Past Surgical History:  Procedure Laterality Date   COLONOSCOPY WITH PROPOFOL  N/A 09/20/2020   Procedure: COLONOSCOPY WITH PROPOFOL ;  Surgeon: Rollin Dover, MD;  Location: [facility];  Service: Endoscopy;  Laterality: N/A;   DILATATION & CURETTAGE/HYSTEROSCOPY WITH MYOSURE N/A 05/07/2022   Procedure:  DILATATION & CURETTAGE/HYSTEROSCOPY WITH MYOSURE;  Surgeon: Delana Ted Morrison, DO;  Location: [facility] [place];  Service: Gynecology;  Laterality: N/A;   WISDOM TOOTH EXTRACTION      Family History  Problem Relation Age of Onset   Breast cancer Mother    Ovarian cysts Mother    Hypertension Mother    Diabetes Mother    Stroke Father    Diabetes Father    Cancer Father        bone marrow   Breast cancer Other    Colon  cancer Neg Hx    Endometrial cancer Neg Hx    Pancreatic cancer Neg Hx    Prostate cancer Neg Hx     Social History   Socioeconomic History   Marital status: Single    Spouse name: Not on file   Number of children: 0   Years of education: some college   Highest education level: Associate degree: occupational, Scientist, product/process development, or vocational program  Occupational History   Occupation: customer service  Tobacco Use   Smoking status: Never   Smokeless tobacco: Never  Vaping Use   Vaping status: Former   Substances: THC  Substance and Sexual Activity   Alcohol use: Not Currently    Comment: rare   Drug use: Not Currently    Types: Marijuana   Sexual activity: Yes    Birth control/protection: None  Other Topics Concern   Not on file  Social History Narrative   Lives with her mother.   Right-handed.   Caffeine use: 1-2 cups per day.   Social Drivers of Corporate investment banker Strain: High Risk (04/01/2023)   Overall Financial Resource Strain (CARDIA)    Difficulty of Paying Living Expenses: Hard  Food Insecurity: No Food Insecurity (04/01/2023)   Hunger Vital Sign    Worried About Running Out of Food in the Last Year: Never true    Ran Out of Food in the Last Year: Never true  Transportation Needs: Unknown (04/01/2023)   PRAPARE - Administrator, Civil Service (Medical): Not on file    Lack of Transportation (Non-Medical): No  Physical Activity: Inactive (04/01/2023)   Exercise Vital Sign    Days of Exercise per Week: 2 days    Minutes of Exercise per Session: 0 min  Stress: Stress Concern Present (04/01/2023)   [employer] of Occupational Health - Occupational Stress Questionnaire    Feeling of Stress : Rather much  Social Connections: Socially Isolated (04/01/2023)   Social Connection and Isolation Panel    Frequency of Communication with Friends and Family: More than three times a week    Frequency of Social Gatherings with Friends and Family: Patient  declined    Attends Religious Services: Never    Database administrator or Organizations: No    Attends Engineer, structural: Not on file    Marital Status: Never married    Current Medications:  Current Outpatient Medications:    ACCU-CHEK GUIDE TEST test strip, USE TO check glucose 1-2 TIMES DAILY, Disp: 100 strip, Rfl: 0   amLODipine  (NORVASC ) 5 MG tablet, TAKE 1 TABLET BY MOUTH EVERY MORNING, Disp: 90 tablet, Rfl: 3   blood glucose meter kit and supplies KIT, One touch glucometer.   Use up to four times daily as directed. (FOR E11.9)., Disp: 1 each, Rfl: 0   celecoxib  (CELEBREX ) 200 MG capsule, One to 2 tablets by mouth daily as needed for pain., Disp: 180 capsule, Rfl:  0   fluticasone  (FLONASE ) 50 MCG/ACT nasal spray, Place 2 sprays into both nostrils daily., Disp: 16 g, Rfl: 6   Lancets (ONETOUCH DELICA PLUS LANCET33G) MISC, USE AS DIRECTED TO check sugars 1-2 TIMES A day-will last 50 DAYS, Disp: 100 each, Rfl: 0   levocetirizine (XYZAL ) 5 MG tablet, TAKE 1 TABLET BY MOUTH EVERY DAY AS NEEDED FOR allergies (cough/drainage), Disp: 90 tablet, Rfl: 3   levonorgestrel  (MIRENA ) 20 MCG/DAY IUD, To be placed in the GYN ONC office, Disp: 1 each, Rfl: 0   losartan -hydrochlorothiazide (HYZAAR) 100-25 MG tablet, TAKE 1 TABLET BY MOUTH EVERY MORNING, Disp: 90 tablet, Rfl: 3   medroxyPROGESTERone  (PROVERA ) 10 MG tablet, Take 1 tablet (10 mg total) by mouth daily., Disp: 30 tablet, Rfl: 6   metFORMIN  (GLUCOPHAGE ) 1000 MG tablet, TAKE 1 TABLET BY MOUTH 2 TIMES DAILY - IN THE MORNING & AT BEDTIME, Disp: 180 tablet, Rfl: 3   PARoxetine  (PAXIL ) 30 MG tablet, TAKE 1 TABLET BY MOUTH EVERY MORNING, Disp: 90 tablet, Rfl: 1   rosuvastatin  (CRESTOR ) 10 MG tablet, TAKE 1 TABLET BY MOUTH EVERY MORNING, Disp: 90 tablet, Rfl: 3   spironolactone  (ALDACTONE ) 50 MG tablet, TAKE 1 TABLET BY MOUTH EVERY MORNING, Disp: 90 tablet, Rfl: 3   TRULICITY  4.5 MG/0.5ML SOAJ, INJECT 4.5 MG into THE SKIN ONCE WEEKLY,  Disp: 2 mL, Rfl: 3  Review of Systems: Denies appetite changes, fevers, chills, fatigue, unexplained weight changes. Denies hearing loss, neck lumps or masses, mouth sores, ringing in ears or voice changes. Denies cough or wheezing.  Denies shortness of breath. Denies chest pain or palpitations. Denies leg swelling. Denies abdominal distention, pain, blood in stools, constipation, diarrhea, nausea, vomiting, or early satiety. Denies pain with intercourse, dysuria, frequency, hematuria or incontinence. Denies hot flashes, pelvic pain, vaginal bleeding or vaginal discharge.   Denies joint pain, back pain or muscle pain/cramps. Denies itching, rash, or wounds. Denies dizziness, headaches, numbness or seizures. Denies swollen lymph nodes or glands, denies easy bruising or bleeding. Denies anxiety, depression, confusion, or decreased concentration.  Physical Exam: BP 114/70 (BP Location: Left Wrist, Patient Position: Sitting)   Pulse 74   Temp 98.7 F (37.1 C) (Oral)   Resp 17   Ht 5' 4 (1.626 m)   Wt 270 lb 6.4 oz (122.7 kg)   SpO2 98%   BMI 46.41 kg/m  General: Alert, oriented, no acute distress. HEENT: Posterior oropharynx clear, sclera anicteric. Abdomen: Obese, soft, nontender.  Normoactive bowel sounds.  No masses or hepatosplenomegaly appreciated.   Extremities: Grossly normal range of motion.  Warm, well perfused.  No edema bilaterally. GU: Normal appearing external genitalia without erythema, excoriation, or lesions.  Speculum exam reveals normal cervix, IUD strings appreciated.    Endometrial biopsy procedure Preoperative diagnosis: Low-grade endometrial cancer Postoperative diagnosis: Same as above Physician: Viktoria MD Estimated blood loss: Minimal Specimens: Endometrial biopsy Procedure: After the procedure was discussed with the patient including risks and benefits, she gave consent.  She was then placed in dorsolithotomy position and a speculum was placed in the  vagina.  Once the cervix was well visualized it was cleansed with Betadine  x3.  An endometrial Pipelle was then passed to a depth of just under 8 cm.  2 passes were performed with sufficient tissue obtained.  This was placed in formalin.  Overall the patient tolerated the procedure well.  All instruments were removed from the vagina.  Laboratory & Radiologic Studies: None new  Assessment & Plan: Zamira Hickam Poznanski is  a 48 y.o. 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   Patient is doing well.  Spotting has essentially stopped since her last visit when we added progesterone.  Has noted some symptoms that are likely related to additional progesterone therapy.  Endometrial biopsy performed today.  I will call her with these results next week.  Weight has remained relatively stable from her last appointment, weight at home significantly less than her weight was today on our scale.  22 minutes of total time was spent for this patient encounter, including preparation, face-to-face counseling with the patient and coordination of care, and documentation of the encounter.  Comer Dollar, MD  Division of Gynecologic Oncology  Department of Obstetrics and Gynecology  [facility]

## 2023-08-27 NOTE — Patient Instructions (Signed)
 It was good to see you today.    I will let you know I have your biopsy results back next week.  We will tentatively plan on a visit in 3 months.  As always, if you develop any new and concerning symptoms before your next visit, please call to see me sooner.
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
## 2023-09-01 ENCOUNTER — Ambulatory Visit: Admitting: Physical Therapy

## 2023-09-01 ENCOUNTER — Telehealth: Payer: Self-pay

## 2023-09-01 ENCOUNTER — Ambulatory Visit

## 2023-09-01 LAB — SURGICAL PATHOLOGY

## 2023-09-01 NOTE — Telephone Encounter (Signed)
 Spoke with patient and had appointment moved from 9/25 to 10/2 .Darlene [name] Patient confirmed

## 2023-09-02 ENCOUNTER — Ambulatory Visit

## 2023-09-02 ENCOUNTER — Telehealth: Payer: Self-pay | Admitting: Gynecologic Oncology

## 2023-09-02 ENCOUNTER — Ambulatory Visit: Payer: Self-pay | Admitting: Gynecologic Oncology

## 2023-09-02 ENCOUNTER — Ambulatory Visit: Attending: Family Medicine

## 2023-09-02 DIAGNOSIS — R262 Difficulty in walking, not elsewhere classified: Secondary | ICD-10-CM | POA: Insufficient documentation

## 2023-09-02 DIAGNOSIS — M25661 Stiffness of right knee, not elsewhere classified: Secondary | ICD-10-CM | POA: Insufficient documentation

## 2023-09-02 DIAGNOSIS — M25551 Pain in right hip: Secondary | ICD-10-CM | POA: Insufficient documentation

## 2023-09-02 DIAGNOSIS — M6281 Muscle weakness (generalized): Secondary | ICD-10-CM | POA: Insufficient documentation

## 2023-09-02 DIAGNOSIS — M25662 Stiffness of left knee, not elsewhere classified: Secondary | ICD-10-CM | POA: Insufficient documentation

## 2023-09-02 DIAGNOSIS — R279 Unspecified lack of coordination: Secondary | ICD-10-CM | POA: Insufficient documentation

## 2023-09-02 DIAGNOSIS — R252 Cramp and spasm: Secondary | ICD-10-CM | POA: Insufficient documentation

## 2023-09-02 DIAGNOSIS — M25562 Pain in left knee: Secondary | ICD-10-CM | POA: Insufficient documentation

## 2023-09-02 DIAGNOSIS — M25561 Pain in right knee: Secondary | ICD-10-CM | POA: Insufficient documentation

## 2023-09-02 DIAGNOSIS — G8929 Other chronic pain: Secondary | ICD-10-CM | POA: Insufficient documentation

## 2023-09-02 DIAGNOSIS — R293 Abnormal posture: Secondary | ICD-10-CM | POA: Insufficient documentation

## 2023-09-02 NOTE — Telephone Encounter (Signed)
 Called patient to discuss EMB results. Given focal EIN and progesterone effect, plan to continue with dual progesterone therapy. F/u in early October.  Comer Dollar MD Gynecologic Oncology

## 2023-09-02 NOTE — Therapy (Signed)
 Patient arrived approx 20 min late for appointment.  PT had explained attendance policy with patient on multiple occasions and explained that if she is more than 15 min late, we need to reschedule.  Suggested she call and cancel or reschedule if she knows the appointment will not work out.   Patient does have a lot of health issues going on and PT inquired about if these issues were preventing her from making her appts on time,  She admitted no, its just me.  PT explained that we were certainly sympathetic to her health issues and would like to continue to work with her but emphasized the importance of attending regularly and on time.  Her certification will run out before she is able to schedule her next visit.  Therefore, last treating PT will do recert based on that visit and we will see patient, allowing her to schedule one visit at a time, up until she sees Dr. Claudene for f/u.  No charges for today as we simply discussed attendance policy again and discussed progress and goals.

## 2023-09-06 ENCOUNTER — Other Ambulatory Visit: Payer: Self-pay | Admitting: Internal Medicine

## 2023-09-07 DIAGNOSIS — F332 Major depressive disorder, recurrent severe without psychotic features: Secondary | ICD-10-CM | POA: Diagnosis not present

## 2023-09-07 DIAGNOSIS — F4310 Post-traumatic stress disorder, unspecified: Secondary | ICD-10-CM | POA: Diagnosis not present

## 2023-09-08 ENCOUNTER — Encounter: Payer: Self-pay | Admitting: Physical Therapy

## 2023-09-08 ENCOUNTER — Ambulatory Visit: Admitting: Physical Therapy

## 2023-09-08 DIAGNOSIS — M6281 Muscle weakness (generalized): Secondary | ICD-10-CM

## 2023-09-08 DIAGNOSIS — M25562 Pain in left knee: Secondary | ICD-10-CM | POA: Diagnosis not present

## 2023-09-08 DIAGNOSIS — G8929 Other chronic pain: Secondary | ICD-10-CM | POA: Diagnosis not present

## 2023-09-08 DIAGNOSIS — R252 Cramp and spasm: Secondary | ICD-10-CM

## 2023-09-08 DIAGNOSIS — R262 Difficulty in walking, not elsewhere classified: Secondary | ICD-10-CM | POA: Diagnosis not present

## 2023-09-08 DIAGNOSIS — R279 Unspecified lack of coordination: Secondary | ICD-10-CM | POA: Diagnosis not present

## 2023-09-08 DIAGNOSIS — R293 Abnormal posture: Secondary | ICD-10-CM

## 2023-09-08 DIAGNOSIS — M25662 Stiffness of left knee, not elsewhere classified: Secondary | ICD-10-CM

## 2023-09-08 DIAGNOSIS — M25561 Pain in right knee: Secondary | ICD-10-CM | POA: Diagnosis not present

## 2023-09-08 DIAGNOSIS — M25551 Pain in right hip: Secondary | ICD-10-CM | POA: Diagnosis not present

## 2023-09-08 DIAGNOSIS — M25661 Stiffness of right knee, not elsewhere classified: Secondary | ICD-10-CM | POA: Diagnosis not present
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
## 2023-09-16 DIAGNOSIS — F332 Major depressive disorder, recurrent severe without psychotic features: Secondary | ICD-10-CM | POA: Diagnosis not present

## 2023-09-16 DIAGNOSIS — F4310 Post-traumatic stress disorder, unspecified: Secondary | ICD-10-CM | POA: Diagnosis not present

## 2023-09-22 ENCOUNTER — Ambulatory Visit

## 2023-09-22 DIAGNOSIS — R293 Abnormal posture: Secondary | ICD-10-CM | POA: Diagnosis not present

## 2023-09-22 DIAGNOSIS — M25551 Pain in right hip: Secondary | ICD-10-CM | POA: Diagnosis not present

## 2023-09-22 DIAGNOSIS — M25661 Stiffness of right knee, not elsewhere classified: Secondary | ICD-10-CM | POA: Diagnosis not present

## 2023-09-22 DIAGNOSIS — R279 Unspecified lack of coordination: Secondary | ICD-10-CM | POA: Diagnosis not present

## 2023-09-22 DIAGNOSIS — M25562 Pain in left knee: Secondary | ICD-10-CM | POA: Diagnosis not present

## 2023-09-22 DIAGNOSIS — G8929 Other chronic pain: Secondary | ICD-10-CM | POA: Diagnosis not present

## 2023-09-22 DIAGNOSIS — M25561 Pain in right knee: Secondary | ICD-10-CM | POA: Diagnosis not present

## 2023-09-22 DIAGNOSIS — M25662 Stiffness of left knee, not elsewhere classified: Secondary | ICD-10-CM

## 2023-09-22 DIAGNOSIS — M6281 Muscle weakness (generalized): Secondary | ICD-10-CM

## 2023-09-22 DIAGNOSIS — R262 Difficulty in walking, not elsewhere classified: Secondary | ICD-10-CM

## 2023-09-22 DIAGNOSIS — R252 Cramp and spasm: Secondary | ICD-10-CM
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
## 2023-09-29 ENCOUNTER — Encounter: Payer: Self-pay | Admitting: Rehabilitative and Restorative Service Providers"

## 2023-09-29 ENCOUNTER — Ambulatory Visit: Admitting: Rehabilitative and Restorative Service Providers"

## 2023-09-29 DIAGNOSIS — G8929 Other chronic pain: Secondary | ICD-10-CM | POA: Diagnosis not present

## 2023-09-29 DIAGNOSIS — R293 Abnormal posture: Secondary | ICD-10-CM | POA: Diagnosis not present

## 2023-09-29 DIAGNOSIS — R262 Difficulty in walking, not elsewhere classified: Secondary | ICD-10-CM | POA: Diagnosis not present

## 2023-09-29 DIAGNOSIS — R279 Unspecified lack of coordination: Secondary | ICD-10-CM | POA: Diagnosis not present

## 2023-09-29 DIAGNOSIS — M25562 Pain in left knee: Secondary | ICD-10-CM | POA: Diagnosis not present

## 2023-09-29 DIAGNOSIS — M25662 Stiffness of left knee, not elsewhere classified: Secondary | ICD-10-CM

## 2023-09-29 DIAGNOSIS — R252 Cramp and spasm: Secondary | ICD-10-CM | POA: Diagnosis not present

## 2023-09-29 DIAGNOSIS — M25661 Stiffness of right knee, not elsewhere classified: Secondary | ICD-10-CM | POA: Diagnosis not present

## 2023-09-29 DIAGNOSIS — M25551 Pain in right hip: Secondary | ICD-10-CM

## 2023-09-29 DIAGNOSIS — M6281 Muscle weakness (generalized): Secondary | ICD-10-CM | POA: Diagnosis not present

## 2023-09-29 DIAGNOSIS — M25561 Pain in right knee: Secondary | ICD-10-CM | POA: Diagnosis not present
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
## 2023-09-30 DIAGNOSIS — F332 Major depressive disorder, recurrent severe without psychotic features: Secondary | ICD-10-CM | POA: Diagnosis not present

## 2023-09-30 DIAGNOSIS — F4310 Post-traumatic stress disorder, unspecified: Secondary | ICD-10-CM | POA: Diagnosis not present
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
## 2023-10-04 ENCOUNTER — Ambulatory Visit: Payer: Self-pay | Attending: Family Medicine | Admitting: Physical Therapy

## 2023-10-04 ENCOUNTER — Encounter: Payer: Self-pay | Admitting: Physical Therapy

## 2023-10-04 DIAGNOSIS — M25551 Pain in right hip: Secondary | ICD-10-CM | POA: Insufficient documentation

## 2023-10-04 DIAGNOSIS — M25562 Pain in left knee: Secondary | ICD-10-CM | POA: Insufficient documentation

## 2023-10-04 DIAGNOSIS — M25662 Stiffness of left knee, not elsewhere classified: Secondary | ICD-10-CM | POA: Diagnosis present

## 2023-10-04 DIAGNOSIS — R293 Abnormal posture: Secondary | ICD-10-CM | POA: Insufficient documentation

## 2023-10-04 DIAGNOSIS — R262 Difficulty in walking, not elsewhere classified: Secondary | ICD-10-CM | POA: Diagnosis present

## 2023-10-04 DIAGNOSIS — G8929 Other chronic pain: Secondary | ICD-10-CM | POA: Insufficient documentation

## 2023-10-04 DIAGNOSIS — M25561 Pain in right knee: Secondary | ICD-10-CM | POA: Insufficient documentation

## 2023-10-04 DIAGNOSIS — M6281 Muscle weakness (generalized): Secondary | ICD-10-CM | POA: Diagnosis present

## 2023-10-04 DIAGNOSIS — M25661 Stiffness of right knee, not elsewhere classified: Secondary | ICD-10-CM | POA: Diagnosis present

## 2023-10-04 DIAGNOSIS — R252 Cramp and spasm: Secondary | ICD-10-CM | POA: Insufficient documentation

## 2023-10-07 ENCOUNTER — Ambulatory Visit: Payer: Self-pay

## 2023-10-07 DIAGNOSIS — R252 Cramp and spasm: Secondary | ICD-10-CM

## 2023-10-07 DIAGNOSIS — M6281 Muscle weakness (generalized): Secondary | ICD-10-CM

## 2023-10-07 DIAGNOSIS — M25661 Stiffness of right knee, not elsewhere classified: Secondary | ICD-10-CM | POA: Diagnosis not present

## 2023-10-07 DIAGNOSIS — M25551 Pain in right hip: Secondary | ICD-10-CM

## 2023-10-07 DIAGNOSIS — M25662 Stiffness of left knee, not elsewhere classified: Secondary | ICD-10-CM

## 2023-10-07 DIAGNOSIS — R262 Difficulty in walking, not elsewhere classified: Secondary | ICD-10-CM

## 2023-10-07 DIAGNOSIS — R293 Abnormal posture: Secondary | ICD-10-CM
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
## 2023-10-15 ENCOUNTER — Ambulatory Visit: Payer: 59 | Admitting: Gynecologic Oncology

## 2023-10-18 ENCOUNTER — Other Ambulatory Visit: Payer: Self-pay | Admitting: Family Medicine

## 2023-10-20 ENCOUNTER — Encounter: Admitting: Genetic Counselor

## 2023-10-20 ENCOUNTER — Ambulatory Visit

## 2023-10-25 ENCOUNTER — Encounter: Payer: Self-pay | Admitting: Genetic Counselor

## 2023-10-26 ENCOUNTER — Inpatient Hospital Stay: Attending: Gynecologic Oncology | Admitting: Genetic Counselor

## 2023-10-26 ENCOUNTER — Other Ambulatory Visit: Payer: Self-pay | Admitting: Genetic Counselor

## 2023-10-26 ENCOUNTER — Encounter: Payer: Self-pay | Admitting: Genetic Counselor

## 2023-10-26 ENCOUNTER — Inpatient Hospital Stay

## 2023-10-26 DIAGNOSIS — Z8042 Family history of malignant neoplasm of prostate: Secondary | ICD-10-CM | POA: Diagnosis not present

## 2023-10-26 DIAGNOSIS — C541 Malignant neoplasm of endometrium: Secondary | ICD-10-CM | POA: Diagnosis not present

## 2023-10-26 DIAGNOSIS — Z803 Family history of malignant neoplasm of breast: Secondary | ICD-10-CM | POA: Diagnosis not present

## 2023-10-26 LAB — GENETIC SCREENING ORDER

## 2023-10-26 NOTE — Progress Notes (Unsigned)
 [name] [address] [place] [address] Phone: [phone] Subjective:   [name], am serving as a scribe for Dr. Arthea Claudene.  I'm seeing this patient by the request  of:  Geofm Glade PARAS, MD  CC: Multiple joint complaints  YEP:Dlagzrupcz  07/27/2023 Discussed HEP, continue weight loss at the patient is going for 250 pounds's.  Hopeful that she will be there at her next follow-up.  Follow-up with me again in 6 to 8 weeks.   Update 10/27/2023 CHELSE [name] is a 48 y.o. female coming in with complaint of B knee pain. Ganglion cyst in R finger injected last visit. Has been going to PT at [place]. Patient states that she has been doing ok. On days when she has more inflammation she has to do a penguin walk.   Cyst has not returned since last visit.    Was seen by genetics for endometrial cancer and family history of breast cancer.  Past Medical History:  Diagnosis Date   Arthritis    hands, low back   Depression    Environmental and seasonal allergies    Family history of breast cancer    Family history of prostate cancer    Gallstones    05-05-2022  per pt no issues currently   Genital herpes    GERD (gastroesophageal reflux disease)    05-05-2022  per pt watches diet , not meds   Hyperlipidemia    Hypertension    Insomnia    Menorrhagia    Mixed incontinence urge and stress    Morbid obesity (HCC)    OSA (obstructive sleep apnea) 2011   (05-05-2022  per pt tried  cpap but intolerent and did not follow up)  first dx 2011--- last sleep study done by dr dohmeier 01-17-2020 moderate to severe osa,  cpap on back order ,  finally received 04/ 2022   Type 2 diabetes mellitus (HCC)    followed by pcp;   (05-05-2022  per pt check blood sugar 3-4 times daily,  fasting average blood sugar 108-115)   Uterine leiomyoma    Vitiligo    Wears contact lenses    Past Surgical History:  Procedure Laterality Date   COLONOSCOPY WITH  PROPOFOL  N/A 09/20/2020   Procedure: COLONOSCOPY WITH PROPOFOL ;  Surgeon: Rollin Dover, MD;  Location: [facility];  Service: Endoscopy;  Laterality: N/A;   DILATATION & CURETTAGE/HYSTEROSCOPY WITH MYOSURE N/A 05/07/2022   Procedure: DILATATION & CURETTAGE/HYSTEROSCOPY WITH MYOSURE;  Surgeon: Delana Ted Morrison, DO;  Location: [facility];  Service: Gynecology;  Laterality: N/A;   WISDOM TOOTH EXTRACTION     Social History   Socioeconomic History   Marital status: Single    Spouse name: Not on file   Number of children: 0   Years of education: some college   Highest education level: Associate degree: occupational, Scientist, product/process development, or vocational program  Occupational History   Occupation: customer service  Tobacco Use   Smoking status: Never   Smokeless tobacco: Never  Vaping Use   Vaping status: Former   Substances: THC  Substance and Sexual Activity   Alcohol use: Not Currently    Comment: rare   Drug use: Not Currently    Types: Marijuana   Sexual activity: Yes    Birth control/protection: None  Other Topics Concern   Not on file  Social History Narrative   Lives with her mother.   Right-handed.   Caffeine use: 1-2 cups  per day.   Social Drivers of Corporate investment banker Strain: High Risk (04/01/2023)   Overall Financial Resource Strain (CARDIA)    Difficulty of Paying Living Expenses: Hard  Food Insecurity: No Food Insecurity (04/01/2023)   Hunger Vital Sign    Worried About Running Out of Food in the Last Year: Never true    Ran Out of Food in the Last Year: Never true  Transportation Needs: Unknown (04/01/2023)   PRAPARE - Administrator, Civil Service (Medical): Not on file    Lack of Transportation (Non-Medical): No  Physical Activity: Inactive (04/01/2023)   Exercise Vital Sign    Days of Exercise per Week: 2 days    Minutes of Exercise per Session: 0 min  Stress: Stress Concern Present (04/01/2023)   [employer] of  Occupational Health - Occupational Stress Questionnaire    Feeling of Stress : Rather much  Social Connections: Socially Isolated (04/01/2023)   Social Connection and Isolation Panel    Frequency of Communication with Friends and Family: More than three times a week    Frequency of Social Gatherings with Friends and Family: Patient declined    Attends Religious Services: Never    Database administrator or Organizations: No    Attends Engineer, structural: Not on file    Marital Status: Never married   Allergies  Allergen Reactions   Cat Dander Other (See Comments)    Difficulty Breathing, Eye irritation   Yeast-Derived Drug Products Other (See Comments)    Per allergy test   Seasonal Ic [Octacosanol]     Pollen- Watery itchy eyes    Chlorpheniramine     Tussionex - caused itching   Apple Rash   Latex Rash   Rice Rash   Tomato Rash and Other (See Comments)    Itchy throat (rash around mouth)   Wound Dressing Adhesive Rash   Family History  Problem Relation Age of Onset   Breast cancer Mother 55   Ovarian cysts Mother    Hypertension Mother    Diabetes Mother    Stroke Father    Diabetes Father    Cancer Father        bone marrow   Breast cancer Maternal Aunt    Breast cancer Paternal Aunt    Prostate cancer Paternal Uncle    Stroke Maternal Grandmother    Lung cancer Paternal Grandfather    Breast cancer Other    Colon cancer Neg Hx    Endometrial cancer Neg Hx    Pancreatic cancer Neg Hx     Current Outpatient Medications (Endocrine & Metabolic):    levonorgestrel  (MIRENA ) 20 MCG/DAY IUD, To be placed in the GYN ONC office   medroxyPROGESTERone  (PROVERA ) 10 MG tablet, Take 1 tablet (10 mg total) by mouth daily.   metFORMIN  (GLUCOPHAGE ) 1000 MG tablet, TAKE 1 TABLET BY MOUTH 2 TIMES DAILY - IN THE MORNING & AT BEDTIME   TRULICITY  4.5 MG/0.5ML SOAJ, INJECT 4.5 MG into THE SKIN ONCE WEEKLY  Current Outpatient Medications (Cardiovascular):    amLODipine   (NORVASC ) 5 MG tablet, TAKE 1 TABLET BY MOUTH EVERY MORNING   losartan -hydrochlorothiazide (HYZAAR) 100-25 MG tablet, TAKE 1 TABLET BY MOUTH EVERY MORNING   rosuvastatin  (CRESTOR ) 10 MG tablet, TAKE 1 TABLET BY MOUTH EVERY MORNING   spironolactone  (ALDACTONE ) 50 MG tablet, TAKE 1 TABLET BY MOUTH EVERY MORNING  Current Outpatient Medications (Respiratory):    fluticasone  (FLONASE ) 50 MCG/ACT nasal spray, Place 2 sprays  into both nostrils daily.   levocetirizine (XYZAL ) 5 MG tablet, TAKE 1 TABLET BY MOUTH EVERY DAY AS NEEDED FOR allergies (cough/drainage)  Current Outpatient Medications (Analgesics):    celecoxib  (CELEBREX ) 200 MG capsule, TAKE 1 to 2 CAPSULES BY MOUTH DAILY AS NEEDED FOR PAIN   Current Outpatient Medications (Other):    ACCU-CHEK GUIDE TEST test strip, USE TO check glucose 1-2 TIMES DAILY   blood glucose meter kit and supplies KIT, One touch glucometer.   Use up to four times daily as directed. (FOR E11.9).   Lancets (ONETOUCH DELICA PLUS LANCET33G) MISC, USE AS DIRECTED TO check sugars 1-2 TIMES A day-will last 50 DAYS   PARoxetine  (PAXIL ) 30 MG tablet, TAKE 1 TABLET BY MOUTH EVERY MORNING   Reviewed prior external information including notes and imaging from  primary care provider As well as notes that were available from care everywhere and other healthcare systems.  Past medical history, social, surgical and family history all reviewed in electronic medical record.  No pertanent information unless stated regarding to the chief complaint.   Review of Systems:  No headache, visual changes, nausea, vomiting, diarrhea, constipation, dizziness, abdominal pain, skin rash, fevers, chills, night sweats, weight loss, swollen lymph nodes, body aches, joint swelling, chest pain, shortness of breath, mood changes. POSITIVE muscle aches  Objective  Blood pressure 122/78, pulse 74, height 5' 4 (1.626 m), weight 244 lb (110.7 kg), SpO2 98%.   General: No apparent distress alert  and oriented x3 mood and affect normal, dressed appropriately.  HEENT: Pupils equal, extraocular movements intact  Respiratory: Patient's speak in full sentences and does not appear short of breath  Bilateral knees do have trace effusion noted.  Crepitus noted with range of motion.  Positive patellar grind test noted.  No specific instability noted.  After informed written and verbal consent, patient was seated on exam table. Right knee was prepped with alcohol swab and utilizing anterolateral approach, patient's right knee space was injected with 4:1  marcaine 0.5%: Kenalog 40mg /dL. Patient tolerated the procedure well without immediate complications.  After informed written and verbal consent, patient was seated on exam table. Left knee was prepped with alcohol swab and utilizing anterolateral approach, patient's left knee space was injected with 4:1  marcaine 0.5%: Kenalog 40mg /dL. Patient tolerated the procedure well without immediate complications.   Impression and Recommendations:    The above documentation has been reviewed and is accurate and complete Jyron Turman M Annais Crafts, DO

## 2023-10-26 NOTE — Progress Notes (Signed)
 REFERRING PROVIDER: Viktoria Comer SAUNDERS, MD [address] [place],  [place] [address]  PRIMARY PROVIDER:  Geofm Glade PARAS, MD  PRIMARY REASON FOR VISIT:  1. Family history of breast cancer   2. Family history of prostate cancer   3. Endometrial cancer (HCC)      HISTORY OF PRESENT ILLNESS:   Darlene [name], a 48 y.o. female, was seen for a [place] cancer genetics consultation at the request of Dr. Viktoria due to a personal and family history of cancer.  Darlene [name] presents to clinic today to discuss the possibility of a hereditary predisposition to cancer, genetic testing, and to further clarify her future cancer risks, as well as potential cancer risks for family members.   In 2024, at the age of 76, Darlene [name] was diagnosed with endometrial cancer.  IHC performed on the tumor was normal.      CANCER HISTORY:  Oncology History   No history exists.     RISK FACTORS:  Menarche was at age 72.  First live birth at age N/A.  OCP use for approximately 2 years.  Ovaries intact: yes.  Hysterectomy: no.  Menopausal status: premenopausal.  HRT use: 0 years. Colonoscopy: yes; normal. Mammogram within the last year: yes. Number of breast biopsies: 0. Up to date with pelvic exams: yes. Any excessive radiation exposure in the past: no  Past Medical History:  Diagnosis Date   Arthritis    hands, low back   Depression    Environmental and seasonal allergies    Family history of breast cancer    Family history of prostate cancer    Gallstones    05-05-2022  per pt no issues currently   Genital herpes    GERD (gastroesophageal reflux disease)    05-05-2022  per pt watches diet , not meds   Hyperlipidemia    Hypertension    Insomnia    Menorrhagia    Mixed incontinence urge and stress    Morbid obesity (HCC)    OSA (obstructive sleep apnea) 2011   (05-05-2022  per pt tried  cpap but intolerent and did not follow up)  first dx 2011--- last sleep study done by dr dohmeier  01-17-2020 moderate to severe osa,  cpap on back order ,  finally received 04/ 2022   Type 2 diabetes mellitus (HCC)    followed by pcp;   (05-05-2022  per pt check blood sugar 3-4 times daily,  fasting average blood sugar 108-115)   Uterine leiomyoma    Vitiligo    Wears contact lenses     Past Surgical History:  Procedure Laterality Date   COLONOSCOPY WITH PROPOFOL  N/A 09/20/2020   Procedure: COLONOSCOPY WITH PROPOFOL ;  Surgeon: Rollin Dover, MD;  Location: [facility];  Service: Endoscopy;  Laterality: N/A;   DILATATION & CURETTAGE/HYSTEROSCOPY WITH MYOSURE N/A 05/07/2022   Procedure: DILATATION & CURETTAGE/HYSTEROSCOPY WITH MYOSURE;  Surgeon: Delana Ted Morrison, DO;  Location: [facility];  Service: Gynecology;  Laterality: N/A;   WISDOM TOOTH EXTRACTION      Social History   Socioeconomic History   Marital status: Single    Spouse name: Not on file   Number of children: 0   Years of education: some college   Highest education level: Associate degree: occupational, Scientist, product/process development, or vocational program  Occupational History   Occupation: customer service  Tobacco Use   Smoking status: Never   Smokeless tobacco: Never  Vaping Use   Vaping status: Former   Substances:  THC  Substance and Sexual Activity   Alcohol use: Not Currently    Comment: rare   Drug use: Not Currently    Types: Marijuana   Sexual activity: Yes    Birth control/protection: None  Other Topics Concern   Not on file  Social History Narrative   Lives with her mother.   Right-handed.   Caffeine use: 1-2 cups per day.   Social Drivers of Corporate investment banker Strain: High Risk (04/01/2023)   Overall Financial Resource Strain (CARDIA)    Difficulty of Paying Living Expenses: Hard  Food Insecurity: No Food Insecurity (04/01/2023)   Hunger Vital Sign    Worried About Running Out of Food in the Last Year: Never true    Ran Out of Food in the Last Year: Never true  Transportation  Needs: Unknown (04/01/2023)   PRAPARE - Administrator, Civil Service (Medical): Not on file    Lack of Transportation (Non-Medical): No  Physical Activity: Inactive (04/01/2023)   Exercise Vital Sign    Days of Exercise per Week: 2 days    Minutes of Exercise per Session: 0 min  Stress: Stress Concern Present (04/01/2023)   [employer] of Occupational Health - Occupational Stress Questionnaire    Feeling of Stress : Rather much  Social Connections: Socially Isolated (04/01/2023)   Social Connection and Isolation Panel    Frequency of Communication with Friends and Family: More than three times a week    Frequency of Social Gatherings with Friends and Family: Patient declined    Attends Religious Services: Never    Database administrator or Organizations: No    Attends Engineer, structural: Not on file    Marital Status: Never married     FAMILY HISTORY:  We obtained a detailed, 4-generation family history.  Significant diagnoses are listed below: Family History  Problem Relation Age of Onset   Breast cancer Mother 34   Ovarian cysts Mother    Hypertension Mother    Diabetes Mother    Stroke Father    Diabetes Father    Cancer Father        bone marrow   Breast cancer Maternal Aunt    Breast cancer Paternal Aunt    Prostate cancer Paternal Uncle    Stroke Maternal Grandmother    Lung cancer Paternal Grandfather    Breast cancer Other    Colon cancer Neg Hx    Endometrial cancer Neg Hx    Pancreatic cancer Neg Hx      The patient does not have children.  She has a twin sister who is cancer free.  She has several paternal half siblings who are cancer free.  Her father is deceased and her mother is living.     The patient's mother had breast cancer in her 85's-60's.  She has a full sister who also had breast cancer.  There were paternal half brothers who had unknown cancer.  The maternal grandparents are deceased.  The grandmother's paternal half  sister had breast cancer.  The patient's father died of a blood cancer, possibly multiple myeloma or something else.  He had several siblings, one had breast cancer and one had prostate cancer.  Darlene [name] is unaware of previous family history of genetic testing for hereditary cancer risks. There is no reported Ashkenazi Jewish ancestry. There is no known consanguinity.  GENETIC COUNSELING ASSESSMENT: Darlene [name] is a 48 y.o. female with a personal and family history  of cancer which is somewhat suggestive of a hereditary cancer syndrome and predisposition to cancer given her young age of onset of uterine cancer. We, therefore, discussed and recommended the following at today's visit.   DISCUSSION: We discussed that, in general, most cancer is not inherited in families, but instead is sporadic or familial. Sporadic cancers occur by chance and typically happen at older ages (>50 years) as this type of cancer is caused by genetic changes acquired during an individual's lifetime. Some families have more cancers than would be expected by chance; however, the ages or types of cancer are not consistent with a known genetic mutation or known genetic mutations have been ruled out. This type of familial cancer is thought to be due to a combination of multiple genetic, environmental, hormonal, and lifestyle factors. While this combination of factors likely increases the risk of cancer, the exact source of this risk is not currently identifiable or testable.  We discussed that 5 - 10% of cancer is hereditary, with most cases of uterine cancer associated with Lynch syndrome.  Darlene [name] had IHC testing of her uterine tumor that was normal, reducing, but not eliminating, the chance for Lynch syndrome  There are other genes that can be associated with hereditary uterine cancer syndromes.  These include PTEN, FH and a few others.  We discussed that testing is beneficial for several reasons including knowing how to follow  individuals after completing their treatment, identifying whether potential treatment options such as PARP inhibitors would be beneficial, and understand if other family members could be at risk for cancer and allow them to undergo genetic testing.   We reviewed the characteristics, features and inheritance patterns of hereditary cancer syndromes. We also discussed genetic testing, including the appropriate family members to test, the process of testing, insurance coverage and turn-around-time for results. We discussed the implications of a negative, positive, carrier and/or variant of uncertain significant result. Darlene [name]  was offered a common hereditary cancer panel (36+ genes) and an expanded pan-cancer panel (70+ genes). Darlene [name] was informed of the benefits and limitations of each panel, including that expanded pan-cancer panels contain genes that do not have clear management guidelines at this point in time.  We also discussed that as the number of genes included on a panel increases, the chances of variants of uncertain significance increases. Darlene [name] decided to pursue genetic testing for the CancerNext-Expanded+RNAinsight gene panel.   The CancerNext-Expanded gene panel offered by [facility] and includes sequencing, rearrangement, and RNA analysis for the following 77 genes: AIP, ALK, APC, ATM, BAP1, BARD1, BMPR1A, BRCA1, BRCA2, BRIP1, CDC73, CDH1, CDK4, CDKN1B, CDKN2A, CEBPA, CHEK2, CTNNA1, DDX41, DICER1, ETV6, FH, FLCN, GATA2, LZTR1, MAX, MBD4, MEN1, MET, MLH1, MSH2, MSH3, MSH6, MUTYH, NF1, NF2, NTHL1, PALB2, PHOX2B, PMS2, POT1, PRKAR1A, PTCH1, PTEN, RAD51C, RAD51D, RB1, RET, RPS20, RUNX1, SDHA, SDHAF2, SDHB, SDHC, SDHD, SMAD4, SMARCA4, SMARCB1, SMARCE1, STK11, SUFU, TMEM127, TP53, TSC1, TSC2, VHL, and WT1 (sequencing and deletion/duplication); AXIN2, CTNNA1, DDX41, EGFR, HOXB13, KIT, MBD4, MITF, MSH3, PDGFRA, POLD1 and POLE (sequencing only); EPCAM and GREM1 (deletion/duplication only).  RNA data is routinely analyzed for use in variant interpretation for all genes.   Based on Darlene [name]'s personal and family history of cancer, she meets medical criteria for genetic testing. Despite that she meets criteria, she may still have an out of pocket cost. We discussed that if her out of pocket cost for testing is over $100, the laboratory will call and confirm whether she wants to proceed  with testing.  If the out of pocket cost of testing is less than $100 she will be billed by the genetic testing laboratory.   PLAN: After considering the risks, benefits, and limitations, Darlene [name] provided informed consent to pursue genetic testing and the blood sample was sent to [facility] for analysis of the CancerNext-Expanded+RNAinsight. Results should be available within approximately 2-3 weeks' time, at which point they will be disclosed by telephone to Darlene [name], as will any additional recommendations warranted by these results. Darlene [name] will receive a summary of her genetic counseling visit and a copy of her results once available. This information will also be available in Epic.   Lastly, we encouraged Darlene [name] to remain in contact with cancer genetics annually so that we can continuously update the family history and inform her of any changes in cancer genetics and testing that may be of benefit for this family.   Darlene [name]'s questions were answered to her satisfaction today. Our contact information was provided should additional questions or concerns arise. Thank you for the referral and allowing us  to share in the care of your patient.   Romaine Maciolek P. Perri, MS, CGC Licensed, Patent attorney [email or web address] phone: [phone]  In total, 60 minutes were spent on the date of the encounter in service to the patient including preparation, face-to-face consultation, documentation and care coordination.  The patient was seen alone. .     _______________________________________________________________________ For Office Staff:  Number of people involved in session: 1 Was an Intern/ student involved with case: no

## 2023-10-27 ENCOUNTER — Other Ambulatory Visit: Payer: Self-pay

## 2023-10-27 ENCOUNTER — Ambulatory Visit (INDEPENDENT_AMBULATORY_CARE_PROVIDER_SITE_OTHER): Admitting: Family Medicine

## 2023-10-27 ENCOUNTER — Encounter: Payer: Self-pay | Admitting: Family Medicine

## 2023-10-27 VITALS — BP 122/78 | HR 74 | Ht 64.0 in | Wt 244.0 lb

## 2023-10-27 DIAGNOSIS — M79641 Pain in right hand: Secondary | ICD-10-CM | POA: Diagnosis not present

## 2023-10-27 DIAGNOSIS — M170 Bilateral primary osteoarthritis of knee: Secondary | ICD-10-CM | POA: Diagnosis not present

## 2023-10-27 NOTE — Patient Instructions (Addendum)
 Injected both knees Approved for gel After 1 week increase activity See me in 2-3 months

## 2023-10-27 NOTE — Assessment & Plan Note (Signed)
 Patient has failed formal physical therapy at this time.  Discussed icing regimen and home exercises, discussed which activities to do and which ones to avoid.  Increase activity slowly.  Discussed icing regimen.  Follow-up again in 6 to 8 weeks.  Could be a candidate for viscosupplementation.

## 2023-10-28 ENCOUNTER — Telehealth: Payer: Self-pay

## 2023-10-28 NOTE — Telephone Encounter (Signed)
-----   Message from Berwyn Posey sent at 10/27/2023 11:12 AM EDT ----- Regarding: visco Can you please run patient for visco for both knees?  Thanks

## 2023-10-28 NOTE — Telephone Encounter (Signed)
 Patient ran for Monovisc for bilateral knees on 10/28/23. Case [id number]. Pending approval.

## 2023-10-29 NOTE — Telephone Encounter (Signed)
 PA required. PA faxed by Ashley. Pending approval.

## 2023-11-04 ENCOUNTER — Telehealth: Payer: Self-pay | Admitting: Genetic Counselor

## 2023-11-04 ENCOUNTER — Encounter: Payer: Self-pay | Admitting: Genetic Counselor

## 2023-11-04 DIAGNOSIS — Z1379 Encounter for other screening for genetic and chromosomal anomalies: Secondary | ICD-10-CM | POA: Insufficient documentation

## 2023-11-04 NOTE — Telephone Encounter (Signed)
 LM on VM that results are back and to please call.  Left CB instructions.

## 2023-11-05 NOTE — Telephone Encounter (Signed)
 Patient scheduled for 12/28/23  MONOVISC authorized for bilateral knee  Copay $80 Coinsurance 60% Deductible $5000 has met $177.96 OOP MAX $8000 has met $876.56 OnceOOP has been met patient covered at 899% PRE CERT REQUIRED  Auth number [id number] 10/29/23-10/27/24

## 2023-11-08 NOTE — Telephone Encounter (Signed)
 Left second message that results were back and asked her to please return my call.  Left CB instructions.

## 2023-11-08 NOTE — Telephone Encounter (Signed)
 noted

## 2023-11-15 ENCOUNTER — Ambulatory Visit: Payer: Self-pay | Admitting: Genetic Counselor

## 2023-11-15 ENCOUNTER — Encounter: Payer: Self-pay | Admitting: Genetic Counselor

## 2023-11-15 DIAGNOSIS — Z1379 Encounter for other screening for genetic and chromosomal anomalies: Secondary | ICD-10-CM

## 2023-11-15 NOTE — Telephone Encounter (Signed)
 Left third message on VM.  Explained that I will message her through her MyChart portal with information about her result.  I will also put in a clinic note into MyChart and upload a copy of her result as well.  She can review these at her convenience and give me a call if she has questions.  I left my CB number as well.  I am pasting a portion of her result to this message.

## 2023-11-15 NOTE — Progress Notes (Signed)
 HPI:  Ms. Corey was previously seen in the [facility] clinic due to a personal and family history of cancer and concerns regarding a hereditary predisposition to cancer. Please refer to our prior cancer genetics clinic note for more information regarding our discussion, assessment and recommendations, at the time. Ms. Sedita's recent genetic test results were disclosed to her, as were recommendations warranted by these results. These results and recommendations are discussed in more detail below.  CANCER HISTORY:  Oncology History  Endometrial cancer (HCC)  06/04/2022 Initial Diagnosis   Endometrial cancer (HCC)   11/03/2023 Genetic Testing   Negative genetic testing on the CancerNext-Expanded+RNAinsight panel.  The report date is November 03, 2023.  The CancerNext-Expanded gene panel offered by [facility] and includes sequencing, rearrangement, and RNA analysis for the following 77 genes: AIP, ALK, APC, ATM, BAP1, BARD1, BMPR1A, BRCA1, BRCA2, BRIP1, CDC73, CDH1, CDK4, CDKN1B, CDKN2A, CEBPA, CHEK2, CTNNA1, DDX41, DICER1, ETV6, FH, FLCN, GATA2, LZTR1, MAX, MBD4, MEN1, MET, MLH1, MSH2, MSH3, MSH6, MUTYH, NF1, NF2, NTHL1, PALB2, PHOX2B, PMS2, POT1, PRKAR1A, PTCH1, PTEN, RAD51C, RAD51D, RB1, RET, RPS20, RUNX1, SDHA, SDHAF2, SDHB, SDHC, SDHD, SMAD4, SMARCA4, SMARCB1, SMARCE1, STK11, SUFU, TMEM127, TP53, TSC1, TSC2, VHL, and WT1 (sequencing and deletion/duplication); AXIN2, CTNNA1, DDX41, EGFR, HOXB13, KIT, MBD4, MITF, MSH3, PDGFRA, POLD1 and POLE (sequencing only); EPCAM and GREM1 (deletion/duplication only). RNA data is routinely analyzed for use in variant interpretation for all genes.      FAMILY HISTORY:  We obtained a detailed, 4-generation family history.  Significant diagnoses are listed below: Family History  Problem Relation Age of Onset   Breast cancer Mother 51   Ovarian cysts Mother    Hypertension Mother    Diabetes Mother    Stroke Father    Diabetes Father     Cancer Father        bone marrow   Breast cancer Maternal Aunt    Breast cancer Paternal Aunt    Prostate cancer Paternal Uncle    Stroke Maternal Grandmother    Lung cancer Paternal Grandfather    Breast cancer Other    Colon cancer Neg Hx    Endometrial cancer Neg Hx    Pancreatic cancer Neg Hx        The patient does not have children.  She has a twin sister who is cancer free.  She has several paternal half siblings who are cancer free.  Her father is deceased and her mother is living.      The patient's mother had breast cancer in her 49's-60's.  She has a full sister who also had breast cancer.  There were paternal half brothers who had unknown cancer.  The maternal grandparents are deceased.  The grandmother's paternal half sister had breast cancer.   The patient's father died of a blood cancer, possibly multiple myeloma or something else.  He had several siblings, one had breast cancer and one had prostate cancer.   Ms. Sigman is unaware of previous family history of genetic testing for hereditary cancer risks. There is no reported Ashkenazi Jewish ancestry. There is no known consanguinity.  GENETIC TEST RESULTS: Genetic testing reported out on November 03, 2023 through the CancerNext-Expanded+RNAinsight cancer panel found no pathogenic mutations. The CancerNext-Expanded gene panel offered by [facility] and includes sequencing, rearrangement, and RNA analysis for the following 77 genes: AIP, ALK, APC, ATM, BAP1, BARD1, BMPR1A, BRCA1, BRCA2, BRIP1, CDC73, CDH1, CDK4, CDKN1B, CDKN2A, CEBPA, CHEK2, CTNNA1, DDX41, DICER1, ETV6, FH, FLCN, GATA2, LZTR1, MAX,  MBD4, MEN1, MET, MLH1, MSH2, MSH3, MSH6, MUTYH, NF1, NF2, NTHL1, PALB2, PHOX2B, PMS2, POT1, PRKAR1A, PTCH1, PTEN, RAD51C, RAD51D, RB1, RET, RPS20, RUNX1, SDHA, SDHAF2, SDHB, SDHC, SDHD, SMAD4, SMARCA4, SMARCB1, SMARCE1, STK11, SUFU, TMEM127, TP53, TSC1, TSC2, VHL, and WT1 (sequencing and deletion/duplication); AXIN2, CTNNA1, DDX41,  EGFR, HOXB13, KIT, MBD4, MITF, MSH3, PDGFRA, POLD1 and POLE (sequencing only); EPCAM and GREM1 (deletion/duplication only). RNA data is routinely analyzed for use in variant interpretation for all genes. The test report has been scanned into EPIC and is located under the Molecular Pathology section of the Results Review tab.  A portion of the result report is included below for reference.     We discussed with Ms. Sansoucie that because current genetic testing is not perfect, it is possible there may be a gene mutation in one of these genes that current testing cannot detect, but that chance is small.  We also discussed, that there could be another gene that has not yet been discovered, or that we have not yet tested, that is responsible for the cancer diagnoses in the family. It is also possible there is a hereditary cause for the cancer in the family that Ms. Rock did not inherit and therefore was not identified in her testing.  Therefore, it is important to remain in touch with cancer genetics in the future so that we can continue to offer Ms. Gayden the most up to date genetic testing.   ADDITIONAL GENETIC TESTING: We discussed with Ms. Revak that her genetic testing was fairly extensive.  If there are genes identified to increase cancer risk that can be analyzed in the future, we would be happy to discuss and coordinate this testing at that time.    CANCER SCREENING RECOMMENDATIONS: Ms. Detwiler's test result is considered negative (normal).  This means that we have not identified a hereditary cause for her personal and family history of cancer at this time. Most cancers happen by chance and this negative test suggests that her personal and family history of cancer may fall into this category.    Possible reasons for Ms. Maggard's negative genetic test include:  1. There may be a gene mutation in one of these genes that current testing methods cannot detect but that chance is small.  2. There could be  another gene that has not yet been discovered, or that we have not yet tested, that is responsible for the cancer diagnoses in the family.  3.  There may be no hereditary risk for cancer in the family. The cancers in Ms. Mcenaney and/or her family may be sporadic/familial or due to other genetic and environmental factors. 4. It is also possible there is a hereditary cause for the cancer in the family that Ms. Alvarenga did not inherit.  Therefore, it is recommended she continue to follow the cancer management and screening guidelines provided by her oncology and primary healthcare provider. An individual's cancer risk and medical management are not determined by genetic test results alone. Overall cancer risk assessment incorporates additional factors, including personal medical history, family history, and any available genetic information that may result in a personalized plan for cancer prevention and surveillance  RECOMMENDATIONS FOR FAMILY MEMBERS:   Since she did not inherit a identifiable mutation in a cancer predisposition gene included on this panel, her children could not have inherited a known mutation from her in one of these genes. Individuals in this family might be at some increased risk of developing cancer, over the general population risk,  simply due to the family history of cancer.  We recommended women in this family have a yearly mammogram beginning at age 48, or 24 years younger than the earliest onset of cancer, an annual clinical breast exam, and perform monthly breast self-exams. Women in this family should also have a gynecological exam as recommended by their primary provider. All family members should be referred for colonoscopy starting at age 7, or 9 years younger than the earliest onset of cancer. It is also possible there is a hereditary cause for the cancer in Ms. Gusman's family that she did not inherit and therefore was not identified in her.  Based on Ms. Palladino's family  history, we recommended her mother, who was diagnosed with breast cancer in her 61's, have genetic counseling and testing. Ms. Egelhoff will let us  know if we can be of any assistance in coordinating genetic counseling and/or testing for this family member.   FOLLOW-UP: Lastly, we discussed with Ms. Ridley that cancer genetics is a rapidly advancing field and it is possible that new genetic tests will be appropriate for her and/or her family members in the future. We encouraged her to remain in contact with cancer genetics on an annual basis so we can update her personal and family histories and let her know of advances in cancer genetics that may benefit this family.   Our contact number was provided. Ms. Grassel's questions were answered to her satisfaction, and she knows she is welcome to call us  at anytime with additional questions or concerns.   Darice Monte, MS, [facility] Licensed, Certified Genetic Counselor [email or web address]

## 2023-11-16 ENCOUNTER — Other Ambulatory Visit: Payer: Self-pay | Admitting: Internal Medicine

## 2023-11-25 ENCOUNTER — Ambulatory Visit: Admitting: Gynecologic Oncology

## 2023-12-01 ENCOUNTER — Encounter: Payer: Self-pay | Admitting: Gynecologic Oncology

## 2023-12-02 ENCOUNTER — Encounter: Payer: Self-pay | Admitting: Gynecologic Oncology

## 2023-12-02 ENCOUNTER — Other Ambulatory Visit: Payer: Self-pay

## 2023-12-02 ENCOUNTER — Inpatient Hospital Stay: Attending: Gynecologic Oncology | Admitting: Gynecologic Oncology

## 2023-12-02 VITALS — BP 123/85 | HR 65 | Temp 98.5°F | Resp 20 | Wt 265.0 lb

## 2023-12-02 DIAGNOSIS — Z6841 Body Mass Index (BMI) 40.0 and over, adult: Secondary | ICD-10-CM | POA: Insufficient documentation

## 2023-12-02 DIAGNOSIS — Z7689 Persons encountering health services in other specified circumstances: Secondary | ICD-10-CM

## 2023-12-02 DIAGNOSIS — N8502 Endometrial intraepithelial neoplasia [EIN]: Secondary | ICD-10-CM | POA: Insufficient documentation

## 2023-12-02 DIAGNOSIS — Z975 Presence of (intrauterine) contraceptive device: Secondary | ICD-10-CM | POA: Insufficient documentation

## 2023-12-02 DIAGNOSIS — Z79890 Hormone replacement therapy (postmenopausal): Secondary | ICD-10-CM | POA: Insufficient documentation

## 2023-12-02 NOTE — Patient Instructions (Signed)
 It was great to see you.  I will call you with your biopsy results next week.  We will tentatively plan on a follow-up visit in 3 months.

## 2023-12-02 NOTE — Progress Notes (Signed)
 OB referral placed per Dr. Ginette request.

## 2023-12-02 NOTE — Addendum Note (Signed)
 Addended by: MELIDA ROLIN HERO on: 12/02/2023 05:17 PM   Modules accepted: Orders

## 2023-12-02 NOTE — Progress Notes (Signed)
 Gynecologic Oncology Return Clinic Visit  12/02/23  Reason for Visit: follow-up  Treatment History: Oncology History  Endometrial cancer (HCC)  06/04/2022 Initial Diagnosis   Endometrial cancer (HCC)   11/03/2023 Genetic Testing   Negative genetic testing on the CancerNext-Expanded+RNAinsight panel.  The report date is November 03, 2023.  The CancerNext-Expanded gene panel offered by [facility] and includes sequencing, rearrangement, and RNA analysis for the following 77 genes: AIP, ALK, APC, ATM, BAP1, BARD1, BMPR1A, BRCA1, BRCA2, BRIP1, CDC73, CDH1, CDK4, CDKN1B, CDKN2A, CEBPA, CHEK2, CTNNA1, DDX41, DICER1, ETV6, FH, FLCN, GATA2, LZTR1, MAX, MBD4, MEN1, MET, MLH1, MSH2, MSH3, MSH6, MUTYH, NF1, NF2, NTHL1, PALB2, PHOX2B, PMS2, POT1, PRKAR1A, PTCH1, PTEN, RAD51C, RAD51D, RB1, RET, RPS20, RUNX1, SDHA, SDHAF2, SDHB, SDHC, SDHD, SMAD4, SMARCA4, SMARCB1, SMARCE1, STK11, SUFU, TMEM127, TP53, TSC1, TSC2, VHL, and WT1 (sequencing and deletion/duplication); AXIN2, CTNNA1, DDX41, EGFR, HOXB13, KIT, MBD4, MITF, MSH3, PDGFRA, POLD1 and POLE (sequencing only); EPCAM and GREM1 (deletion/duplication only). RNA data is routinely analyzed for use in variant interpretation for all genes.     Patient was seen in September 2021 and at that time had a thickened endometrial lining noted on imaging.  She opted to undergo hysteroscopy with D&C rather than endometrial biopsy but did not get this scheduled.  She was started on Provera , which she took for 1 month to regulate her menses, but then discontinued the medication.  Repeat ultrasound in 11/2020 showed an endometrial lining of 9 mm.  Patient was seen again in October 2023 and noted menstrual cycle that had been ongoing for a month.  She has a history of abnormal cycles, will often skip multiple months at a time.  When she does have menses, she will bleed for a prolonged period.  Office pelvic ultrasound in October 2023 showed a uterus measuring 8.7 x 5.2 x 5.6 cm  with a 2.1 cm thickened and heterogenous appearing endometrium, increased vascularity noted with suspected polyps.  A stable 1.8 cm fibroid.  Ovaries noted to be normal in appearance.  Given appearance of polyps, recommendation was made for hysteroscopy with D&C.   D&C and polypectomy from 05/07/22 showed endometrioid carcinoma, grade 1. MMR itnact, p53 wildtype.   AMH on 3/15: 0.037   MRI pelvis 05/19/22: 7 mm small, focal nodular appearing endometrial lesion in the lower uterine segment.  No evidence of myometrial invasion.  Normal ovaries. No adenopathy or other evidence of metastatic disease.   Mirena  IUD placed on 05/22/22.   Endometrial biopsy on 08/28/2022: Benign inactive endometrium, benign cervical glandular mucosa.  No hyperplasia or malignancy.   Endometrial biopsy on 11/26/2022: Endometrium with focal complex nonatypical hyperplasia, mucinous metaplasia and stromal pseudodecidualization consistent with hormone treatment effect.   Endometrial biopsy on 04/14/2023: EIN with stromal progestational effects.  No definitive low-grade endometrial cancer.  EMB 08/27/23: small focus of EIN with background progestational effect.  Interval History: Doing well.  Having the least amount of vaginal bleeding since we started hormonal therapy.  Spotting some days but not daily.  Has occasional cramping related to bleeding.  Past Medical/Surgical History: Past Medical History:  Diagnosis Date   Arthritis    hands, low back   Depression    Environmental and seasonal allergies    Family history of breast cancer    Family history of prostate cancer    Gallstones    05-05-2022  per pt no issues currently   Genital herpes    GERD (gastroesophageal reflux disease)    05-05-2022  per pt watches diet ,  not meds   Hyperlipidemia    Hypertension    Insomnia    Menorrhagia    Mixed incontinence urge and stress    Morbid obesity (HCC)    OSA (obstructive sleep apnea) 2011   (05-05-2022  per pt  tried  cpap but intolerent and did not follow up)  first dx 2011--- last sleep study done by dr dohmeier 01-17-2020 moderate to severe osa,  cpap on back order ,  finally received 04/ 2022   Type 2 diabetes mellitus (HCC)    followed by pcp;   (05-05-2022  per pt check blood sugar 3-4 times daily,  fasting average blood sugar 108-115)   Uterine leiomyoma    Vitiligo    Wears contact lenses     Past Surgical History:  Procedure Laterality Date   COLONOSCOPY WITH PROPOFOL  N/A 09/20/2020   Procedure: COLONOSCOPY WITH PROPOFOL ;  Surgeon: Rollin Dover, MD;  Location: [facility];  Service: Endoscopy;  Laterality: N/A;   DILATATION & CURETTAGE/HYSTEROSCOPY WITH MYOSURE N/A 05/07/2022   Procedure: DILATATION & CURETTAGE/HYSTEROSCOPY WITH MYOSURE;  Surgeon: Delana Ted Morrison, DO;  Location: [facility];  Service: Gynecology;  Laterality: N/A;   WISDOM TOOTH EXTRACTION      Family History  Problem Relation Age of Onset   Breast cancer Mother 14   Ovarian cysts Mother    Hypertension Mother    Diabetes Mother    Stroke Father    Diabetes Father    Cancer Father        bone marrow   Breast cancer Maternal Aunt    Breast cancer Paternal Aunt    Prostate cancer Paternal Uncle    Stroke Maternal Grandmother    Lung cancer Paternal Grandfather    Breast cancer Other    Colon cancer Neg Hx    Endometrial cancer Neg Hx    Pancreatic cancer Neg Hx     Social History   Socioeconomic History   Marital status: Single    Spouse name: Not on file   Number of children: 0   Years of education: some college   Highest education level: Associate degree: occupational, Scientist, product/process development, or vocational program  Occupational History   Occupation: customer service  Tobacco Use   Smoking status: Never   Smokeless tobacco: Never  Vaping Use   Vaping status: Former   Substances: THC  Substance and Sexual Activity   Alcohol use: Not Currently    Comment: rare   Drug use: Not Currently     Types: Marijuana   Sexual activity: Yes    Birth control/protection: None  Other Topics Concern   Not on file  Social History Narrative   Lives with her mother.   Right-handed.   Caffeine use: 1-2 cups per day.   Social Drivers of Corporate investment banker Strain: High Risk (04/01/2023)   Overall Financial Resource Strain (CARDIA)    Difficulty of Paying Living Expenses: Hard  Food Insecurity: No Food Insecurity (12/01/2023)   Hunger Vital Sign    Worried About Running Out of Food in the Last Year: Never true    Ran Out of Food in the Last Year: Never true  Transportation Needs: No Transportation Needs (12/01/2023)   PRAPARE - Administrator, Civil Service (Medical): No    Lack of Transportation (Non-Medical): No  Physical Activity: Inactive (04/01/2023)   Exercise Vital Sign    Days of Exercise per Week: 2 days    Minutes of Exercise per  Session: 0 min  Stress: Stress Concern Present (04/01/2023)   [employer] of Occupational Health - Occupational Stress Questionnaire    Feeling of Stress : Rather much  Social Connections: Socially Isolated (04/01/2023)   Social Connection and Isolation Panel    Frequency of Communication with Friends and Family: More than three times a week    Frequency of Social Gatherings with Friends and Family: Patient declined    Attends Religious Services: Never    Database administrator or Organizations: No    Attends Engineer, structural: Not on file    Marital Status: Never married    Current Medications:  Current Outpatient Medications:    ACCU-CHEK GUIDE TEST test strip, USE TO check glucose 1-2 TIMES DAILY, Disp: 100 strip, Rfl: 0   amLODipine  (NORVASC ) 5 MG tablet, TAKE 1 TABLET BY MOUTH EVERY MORNING, Disp: 30 tablet, Rfl: 0   blood glucose meter kit and supplies KIT, One touch glucometer.   Use up to four times daily as directed. (FOR E11.9)., Disp: 1 each, Rfl: 0   celecoxib  (CELEBREX ) 200 MG capsule, TAKE 1 to  2 CAPSULES BY MOUTH DAILY AS NEEDED FOR PAIN, Disp: 60 capsule, Rfl: 0   fluticasone  (FLONASE ) 50 MCG/ACT nasal spray, Place 2 sprays into both nostrils daily., Disp: 16 g, Rfl: 6   Lancets (ONETOUCH DELICA PLUS LANCET33G) MISC, USE AS DIRECTED TO check sugars 1-2 TIMES A day-will last 50 DAYS, Disp: 100 each, Rfl: 0   levocetirizine (XYZAL ) 5 MG tablet, TAKE 1 TABLET BY MOUTH EVERY DAY AS NEEDED FOR allergies (cough/drainage), Disp: 90 tablet, Rfl: 3   levonorgestrel  (MIRENA ) 20 MCG/DAY IUD, To be placed in the GYN ONC office, Disp: 1 each, Rfl: 0   losartan -hydrochlorothiazide (HYZAAR) 100-25 MG tablet, TAKE 1 TABLET BY MOUTH EVERY MORNING, Disp: 90 tablet, Rfl: 3   medroxyPROGESTERone  (PROVERA ) 10 MG tablet, Take 1 tablet (10 mg total) by mouth daily., Disp: 30 tablet, Rfl: 6   metFORMIN  (GLUCOPHAGE ) 1000 MG tablet, TAKE 1 TABLET BY MOUTH 2 TIMES DAILY - IN THE MORNING & AT BEDTIME, Disp: 180 tablet, Rfl: 3   PARoxetine  (PAXIL ) 30 MG tablet, TAKE 1 TABLET BY MOUTH EVERY MORNING, Disp: 90 tablet, Rfl: 1   rosuvastatin  (CRESTOR ) 10 MG tablet, TAKE 1 TABLET BY MOUTH EVERY MORNING, Disp: 90 tablet, Rfl: 3   spironolactone  (ALDACTONE ) 50 MG tablet, TAKE 1 TABLET BY MOUTH EVERY MORNING, Disp: 30 tablet, Rfl: 0   TRULICITY  4.5 MG/0.5ML SOAJ, INJECT 4.5 MG into THE SKIN ONCE WEEKLY, Disp: 2 mL, Rfl: 3  Review of Systems: + Vaginal bleeding, joint pain, back pain, anxiety, depression Denies appetite changes, fevers, chills, fatigue, unexplained weight changes. Denies hearing loss, neck lumps or masses, mouth sores, ringing in ears or voice changes. Denies cough or wheezing.  Denies shortness of breath. Denies chest pain or palpitations. Denies leg swelling. Denies abdominal distention, pain, blood in stools, constipation, diarrhea, nausea, vomiting, or early satiety. Denies pain with intercourse, dysuria, frequency, hematuria or incontinence. Denies hot flashes or vaginal discharge.   Denies muscle  pain/cramps. Denies itching, rash, or wounds. Denies dizziness, headaches, numbness or seizures. Denies swollen lymph nodes or glands, denies easy bruising or bleeding. Denies confusion, or decreased concentration.  Physical Exam: BP 123/85 (BP Location: Left Arm, Patient Position: Sitting)   Pulse 65   Temp 98.5 F (36.9 C) (Oral)   Resp 20   Wt 265 lb (120.2 kg)   SpO2 100%   BMI 45.49  kg/m  General: Alert, oriented, no acute distress. HEENT: Posterior oropharynx clear, sclera anicteric. Abdomen: Obese, soft, nontender.  Normoactive bowel sounds.  No masses or hepatosplenomegaly appreciated.   Extremities: Grossly normal range of motion.  Warm, well perfused.  No edema bilaterally. GU: Normal appearing external genitalia without erythema, excoriation, or lesions.  Speculum exam reveals normal cervix, IUD strings appreciated.     Endometrial biopsy procedure Preoperative diagnosis: Low-grade endometrial cancer Postoperative diagnosis: Same as above Physician: Viktoria MD Estimated blood loss: Minimal Specimens: Endometrial biopsy Procedure: After the procedure was discussed with the patient including risks and benefits, she gave consent.  She was then placed in dorsolithotomy position and a speculum was placed in the vagina.  Once the cervix was well visualized it was cleansed with Betadine  x3.  An endometrial Pipelle was then passed to a depth of 7.5 cm.  1 pass was performed with sufficient tissue obtained.  This was placed in formalin.  Overall the patient tolerated the procedure well.  All instruments were removed from the vagina.  Laboratory & Radiologic Studies: None new  Assessment & Plan: AUBREYANA SALTZ is a 48 y.o. 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 Most recent biopsy with focus of EIN. Recent germline genetic testing performed and negative.   Patient  is doing well.  Having the least amount of spotting/bleeding since starting progesterone therapy.   Endometrial biopsy performed today.  I will call her with these results next week.   Tentative plan for follow-up visit in 3 months.  Weight is down 5 pounds from her visit with me in June.  20 minutes of total time was spent for this patient encounter, including preparation, face-to-face counseling with the patient and coordination of care, and documentation of the encounter.  Comer Viktoria, MD  Division of Gynecologic Oncology  Department of Obstetrics and Gynecology  [facility]  [facility]

## 2023-12-07 ENCOUNTER — Ambulatory Visit: Payer: Self-pay | Admitting: Gynecologic Oncology

## 2023-12-08 LAB — SURGICAL PATHOLOGY

## 2023-12-10 ENCOUNTER — Other Ambulatory Visit: Payer: Self-pay | Admitting: Internal Medicine

## 2023-12-10 ENCOUNTER — Other Ambulatory Visit: Payer: Self-pay | Admitting: Family Medicine

## 2023-12-10 ENCOUNTER — Other Ambulatory Visit: Payer: Self-pay

## 2023-12-10 ENCOUNTER — Telehealth: Payer: Self-pay | Admitting: Gynecologic Oncology

## 2023-12-10 NOTE — Telephone Encounter (Signed)
 Called patient again to discuss biopsy results.  Reviewed results with her and the fact that there is now focal endometrial cancer seen on her biopsy.  Reviewed my concern that despite initial regression, she has now had progression to EIN and now focal endometrial cancer.  Discussed that I think we need to revisit the idea of definitive surgery with total hysterectomy.  She is open to at least revisiting this.  I encouraged her to take the weekend and then reach out to me either by phone or by MyChart message to let me know if she would like to set up a phone call to discuss further or an in person visit.  We also discussed what options she would have if she does not want to move forward with surgery at this time.  Discussed that we could increase the dose of oral progesterone although again I worry with her progression despite Mirena  IUD being in place.  Comer Dollar MD Gynecologic Oncology

## 2023-12-13 ENCOUNTER — Encounter: Payer: Self-pay | Admitting: Internal Medicine

## 2023-12-13 NOTE — Progress Notes (Unsigned)
 Subjective:    Patient ID: Darlene [name], female    DOB: Jun 27, 1975, 48 y.o.   MRN: [id number]     HPI Vincenza is here for follow up of her chronic medical problems.  No concerns   Started last week to walk every day.   Has not been taking the trulicity  weekly.    Biopsy of endometrium showed cancer - trying to decide what she is going to do.    Medications and allergies reviewed with patient and updated if appropriate.  Current Outpatient Medications on File Prior to Visit  Medication Sig Dispense Refill  . ACCU-CHEK GUIDE TEST test strip USE TO check glucose 1-2 TIMES DAILY 100 strip 0  . amLODipine  (NORVASC ) 5 MG tablet TAKE 1 TABLET BY MOUTH EVERY MORNING (need office visit) 30 tablet 0  . blood glucose meter kit and supplies KIT One touch glucometer.   Use up to four times daily as directed. (FOR E11.9). 1 each 0  . celecoxib  (CELEBREX ) 200 MG capsule TAKE 1 TO 2 CAPSULES BY MOUTH ONCE DAILY AS NEEDED 60 capsule 0  . fluticasone  (FLONASE ) 50 MCG/ACT nasal spray Place 2 sprays into both nostrils daily. 16 g 6  . Lancets (ONETOUCH DELICA PLUS LANCET33G) MISC USE AS DIRECTED TO check sugars 1-2 TIMES A day-will last 50 DAYS 100 each 0  . levocetirizine (XYZAL ) 5 MG tablet TAKE 1 TABLET BY MOUTH EVERY DAY AS NEEDED FOR allergies (cough/drainage) 90 tablet 3  . levonorgestrel  (MIRENA ) 20 MCG/DAY IUD To be placed in the GYN ONC office 1 each 0  . losartan -hydrochlorothiazide (HYZAAR) 100-25 MG tablet TAKE 1 TABLET BY MOUTH EVERY MORNING 90 tablet 3  . medroxyPROGESTERone  (PROVERA ) 10 MG tablet Take 1 tablet (10 mg total) by mouth daily. 30 tablet 6  . metFORMIN  (GLUCOPHAGE ) 1000 MG tablet TAKE 1 TABLET BY MOUTH 2 TIMES DAILY - IN THE MORNING & AT BEDTIME 180 tablet 3  . PARoxetine  (PAXIL ) 30 MG tablet TAKE 1 TABLET BY MOUTH EVERY MORNING 90 tablet 1  . rosuvastatin  (CRESTOR ) 10 MG tablet TAKE 1 TABLET BY MOUTH EVERY MORNING 90 tablet 3  . spironolactone  (ALDACTONE ) 50 MG  tablet TAKE 1 TABLET BY MOUTH EVERY MORNING 30 tablet 0  . TRULICITY  4.5 MG/0.5ML SOAJ INJECT 4.5 MG into THE SKIN ONCE WEEKLY 2 mL 3   No current facility-administered medications on file prior to visit.     Review of Systems  Constitutional:  Negative for fever.  HENT:  Negative for ear pain.        Ear fullness  Respiratory:  Negative for cough, shortness of breath and wheezing.   Cardiovascular:  Positive for palpitations (occ). Negative for chest pain and leg swelling.  Neurological:  Positive for headaches (occ). Negative for light-headedness.       Objective:   Vitals:   12/14/23 1522  BP: 130/80  Pulse: 79  Temp: 98.8 F (37.1 C)  SpO2: 99%   BP Readings from Last 3 Encounters:  12/14/23 130/80  12/02/23 123/85  10/27/23 122/78   Wt Readings from Last 3 Encounters:  12/14/23 267 lb (121.1 kg)  12/02/23 265 lb (120.2 kg)  10/27/23 244 lb (110.7 kg)   Body mass index is 45.83 kg/m.    Physical Exam Constitutional:      General: She is not in acute distress.    Appearance: Normal appearance.  HENT:     Head: Normocephalic and atraumatic.  Eyes:  Conjunctiva/sclera: Conjunctivae normal.  Cardiovascular:     Rate and Rhythm: Normal rate and regular rhythm.     Heart sounds: Normal heart sounds.  Pulmonary:     Effort: Pulmonary effort is normal. No respiratory distress.     Breath sounds: Normal breath sounds. No wheezing.  Musculoskeletal:     Cervical back: Neck supple.     Right lower leg: No edema.     Left lower leg: No edema.  Lymphadenopathy:     Cervical: No cervical adenopathy.  Skin:    General: Skin is warm and dry.     Findings: No rash.  Neurological:     Mental Status: She is alert. Mental status is at baseline.  Psychiatric:        Mood and Affect: Mood normal.        Behavior: Behavior normal.        Lab Results  Component Value Date   WBC 7.5 12/11/2022   HGB 14.2 12/11/2022   HCT 43.6 12/11/2022   PLT 304.0  12/11/2022   GLUCOSE 103 (H) 12/11/2022   CHOL 124 09/09/2022   TRIG 104.0 09/09/2022   HDL 38.50 (L) 09/09/2022   LDLDIRECT 148.6 02/13/2009   LDLCALC 65 09/09/2022   ALT 15 12/11/2022   AST 17 12/11/2022   NA 141 12/11/2022   K 3.9 12/11/2022   CL 100 12/11/2022   CREATININE 1.06 12/11/2022   BUN 13 12/11/2022   CO2 32 12/11/2022   TSH 2.43 12/11/2022   HGBA1C 5.9 12/11/2022     Assessment & Plan:    See Problem List for Assessment and Plan of chronic medical problems.

## 2023-12-13 NOTE — Patient Instructions (Addendum)
      Blood work was ordered.       Medications changes include :   None    A referral was ordered and someone will call you to schedule an appointment.     Return in about 6 months (around 06/13/2024) for follow up.

## 2023-12-14 ENCOUNTER — Ambulatory Visit: Payer: Self-pay | Admitting: Internal Medicine

## 2023-12-14 VITALS — BP 130/80 | HR 79 | Temp 98.8°F | Ht 64.0 in | Wt 267.0 lb

## 2023-12-14 DIAGNOSIS — E785 Hyperlipidemia, unspecified: Secondary | ICD-10-CM

## 2023-12-14 DIAGNOSIS — E1159 Type 2 diabetes mellitus with other circulatory complications: Secondary | ICD-10-CM

## 2023-12-14 DIAGNOSIS — H938X3 Other specified disorders of ear, bilateral: Secondary | ICD-10-CM

## 2023-12-14 DIAGNOSIS — F3289 Other specified depressive episodes: Secondary | ICD-10-CM

## 2023-12-14 DIAGNOSIS — G4733 Obstructive sleep apnea (adult) (pediatric): Secondary | ICD-10-CM

## 2023-12-14 DIAGNOSIS — I152 Hypertension secondary to endocrine disorders: Secondary | ICD-10-CM

## 2023-12-14 DIAGNOSIS — E1169 Type 2 diabetes mellitus with other specified complication: Secondary | ICD-10-CM

## 2023-12-14 DIAGNOSIS — Z7985 Long-term (current) use of injectable non-insulin antidiabetic drugs: Secondary | ICD-10-CM

## 2023-12-14 DIAGNOSIS — E119 Type 2 diabetes mellitus without complications: Secondary | ICD-10-CM

## 2023-12-14 LAB — COMPREHENSIVE METABOLIC PANEL WITH GFR
ALT: 10 U/L (ref 0–35)
AST: 13 U/L (ref 0–37)
Albumin: 4.6 g/dL (ref 3.5–5.2)
Alkaline Phosphatase: 64 U/L (ref 39–117)
BUN: 14 mg/dL (ref 6–23)
CO2: 33 meq/L — ABNORMAL HIGH (ref 19–32)
Calcium: 10.2 mg/dL (ref 8.4–10.5)
Chloride: 99 meq/L (ref 96–112)
Creatinine, Ser: 1.06 mg/dL (ref 0.40–1.20)
GFR: 61.75 mL/min (ref >60.00)
Glucose, Bld: 141 mg/dL — ABNORMAL HIGH (ref 70–99)
Potassium: 3.5 meq/L (ref 3.5–5.1)
Sodium: 139 meq/L (ref 135–145)
Total Bilirubin: 0.4 mg/dL (ref 0.2–1.2)
Total Protein: 7.9 g/dL (ref 6.0–8.3)

## 2023-12-14 LAB — MICROALBUMIN / CREATININE URINE RATIO
Creatinine,U: 225.5 mg/dL
Microalb Creat Ratio: 13.7 mg/g (ref 0.0–30.0)
Microalb, Ur: 3.1 mg/dL — ABNORMAL HIGH (ref 0.0–1.9)

## 2023-12-14 LAB — LIPID PANEL
Cholesterol: 181 mg/dL (ref 0–200)
HDL: 52.3 mg/dL (ref >39.00)
LDL Cholesterol: 102 mg/dL — ABNORMAL HIGH (ref 0–99)
NonHDL: 128.65
Total CHOL/HDL Ratio: 3
Triglycerides: 134 mg/dL (ref 0.0–149.0)
VLDL: 26.8 mg/dL (ref 0.0–40.0)

## 2023-12-14 LAB — CBC
HCT: 45.8 % (ref 36.0–46.0)
Hemoglobin: 15 g/dL (ref 12.0–15.0)
MCHC: 32.8 g/dL (ref 30.0–36.0)
MCV: 90.4 fl (ref 78.0–100.0)
Platelets: 258 K/uL (ref 150.0–400.0)
RBC: 5.06 Mil/uL (ref 3.87–5.11)
RDW: 13.1 % (ref 11.5–15.5)
WBC: 6.4 K/uL (ref 4.0–10.5)

## 2023-12-14 LAB — HEMOGLOBIN A1C: Hgb A1c MFr Bld: 6.5 % (ref 4.6–6.5)

## 2023-12-14 MED ORDER — SPIRONOLACTONE 50 MG PO TABS: 50.0000 mg | ORAL_TABLET | Freq: Every morning | ORAL | 1 refills | Status: DC

## 2023-12-14 MED ORDER — TRULICITY 4.5 MG/0.5ML ~~LOC~~ SOAJ: SUBCUTANEOUS | 5 refills | Status: AC

## 2023-12-14 MED ORDER — PAROXETINE HCL 30 MG PO TABS: 30.0000 mg | ORAL_TABLET | Freq: Every morning | ORAL | 1 refills | Status: AC

## 2023-12-14 NOTE — Assessment & Plan Note (Signed)
 Chronic Associated with hyperlipidemia  Lab Results  Component Value Date   HGBA1C 6.5 12/14/2023   Sugars controlled Check A1c, urine microalbumin We stopped glimepiride  last fall Continue Trulicity  4.5 mg weekly - take weekly, metformin  1000 mg daily  Stressed weight loss Stressed regular exercise Stressed diabetic diet

## 2023-12-14 NOTE — Assessment & Plan Note (Signed)
 Chronic Regular exercise and healthy diet encouraged Continue weight loss efforts Lab Results  Component Value Date   LDLCALC 102 (H) 12/14/2023   LDL well-controlled Continue Crestor  10 mg daily

## 2023-12-14 NOTE — Assessment & Plan Note (Signed)
Chronic Stable Continue paxil 30 mg daily  

## 2023-12-14 NOTE — Assessment & Plan Note (Signed)
 Chronic With htn, hld, DM Actively working on weight loss-Trulicity  is helping Stressed importance of continuing to eat healthy, eating smaller portions, exercise regularly in addition to taking the Trulicity  Discussed getting a good amount of protein, vegetables-stressed low sugars and carbs

## 2023-12-14 NOTE — Assessment & Plan Note (Signed)
 New No pain - just b/l ear fullness Taking xyzal  Not using flonase  daily --- start using it daily

## 2023-12-14 NOTE — Assessment & Plan Note (Signed)
 Chronic Blood pressure borderline controlled CBC, CMP Continue weight loss efforts Stressed healthy diet, regular exercise Continue amlodipine  5 mg daily, losartan -HCTZ 100-25 mg daily, spironolactone  50 mg daily

## 2023-12-15 ENCOUNTER — Telehealth: Payer: Self-pay | Admitting: Oncology

## 2023-12-15 ENCOUNTER — Ambulatory Visit: Payer: Self-pay | Admitting: Internal Medicine

## 2023-12-15 NOTE — Telephone Encounter (Signed)
Left a message regarding surgery decision.  Requested a return call.

## 2023-12-15 NOTE — Telephone Encounter (Signed)
 Darlene [name] called back and said she would not be able to have surgery until after January 2026 because she does not currently have insurance coverage.  She would like to talk with Dr. Viktoria about other options like increasing the dose of oral progesterone until she has insurance coverage.

## 2023-12-22 ENCOUNTER — Telehealth: Payer: Self-pay | Admitting: Oncology

## 2023-12-22 NOTE — Telephone Encounter (Signed)
 Called [place] and scheduled a telephone visit with Dr. Viktoria tomorrow at 6:00. Advised her that Dr. Viktoria may call anytime during the day between surgeries.  She verbalized understanding and agreement.

## 2023-12-23 ENCOUNTER — Encounter: Payer: Self-pay | Admitting: Gynecologic Oncology

## 2023-12-23 ENCOUNTER — Inpatient Hospital Stay (HOSPITAL_BASED_OUTPATIENT_CLINIC_OR_DEPARTMENT_OTHER): Payer: Self-pay | Admitting: Gynecologic Oncology

## 2023-12-23 DIAGNOSIS — Z7189 Other specified counseling: Secondary | ICD-10-CM

## 2023-12-23 DIAGNOSIS — C541 Malignant neoplasm of endometrium: Secondary | ICD-10-CM

## 2023-12-23 MED ORDER — MEGESTROL ACETATE 40 MG PO TABS: 80.0000 mg | ORAL_TABLET | Freq: Two times a day (BID) | ORAL | 6 refills | Status: DC

## 2023-12-23 NOTE — Progress Notes (Unsigned)
  [name] [address] [place] [address] Phone: [phone] Subjective:   Unable to see patient

## 2023-12-23 NOTE — Progress Notes (Signed)
 Gynecologic Oncology Telehealth Note: Gyn-Onc  I connected with Darlene [name] on 12/23/23 at  6:00 PM EDT by telephone and verified that I am speaking with the correct person using two identifiers.  I discussed the limitations, risks, security and privacy concerns of performing an evaluation and management service by telemedicine and the availability of in-person appointments. I also discussed with the patient that there may be a patient responsible charge related to this service. The patient expressed understanding and agreed to proceed.  Other persons participating in the visit and their role in the encounter: none.  Patient's location: [place] Provider's location: [place]  Reason for Visit: follow-up  Treatment History: Oncology History  Endometrial cancer (HCC)  06/04/2022 Initial Diagnosis   Endometrial cancer (HCC)   11/03/2023 Genetic Testing   Negative genetic testing on the CancerNext-Expanded+RNAinsight panel.  The report date is November 03, 2023.  The CancerNext-Expanded gene panel offered by [facility] and includes sequencing, rearrangement, and RNA analysis for the following 77 genes: AIP, ALK, APC, ATM, BAP1, BARD1, BMPR1A, BRCA1, BRCA2, BRIP1, CDC73, CDH1, CDK4, CDKN1B, CDKN2A, CEBPA, CHEK2, CTNNA1, DDX41, DICER1, ETV6, FH, FLCN, GATA2, LZTR1, MAX, MBD4, MEN1, MET, MLH1, MSH2, MSH3, MSH6, MUTYH, NF1, NF2, NTHL1, PALB2, PHOX2B, PMS2, POT1, PRKAR1A, PTCH1, PTEN, RAD51C, RAD51D, RB1, RET, RPS20, RUNX1, SDHA, SDHAF2, SDHB, SDHC, SDHD, SMAD4, SMARCA4, SMARCB1, SMARCE1, STK11, SUFU, TMEM127, TP53, TSC1, TSC2, VHL, and WT1 (sequencing and deletion/duplication); AXIN2, CTNNA1, DDX41, EGFR, HOXB13, KIT, MBD4, MITF, MSH3, PDGFRA, POLD1 and POLE (sequencing only); EPCAM and GREM1 (deletion/duplication only). RNA data is routinely analyzed for use in variant interpretation for all genes.     Patient was seen in September 2021 and at that time had a thickened endometrial lining noted on imaging.   She opted to undergo hysteroscopy with D&C rather than endometrial biopsy but did not get this scheduled.  She was started on Provera , which she took for 1 month to regulate her menses, but then discontinued the medication.  Repeat ultrasound in 11/2020 showed an endometrial lining of 9 mm.  Patient was seen again in October 2023 and noted menstrual cycle that had been ongoing for a month.  She has a history of abnormal cycles, will often skip multiple months at a time.  When she does have menses, she will bleed for a prolonged period.  Office pelvic ultrasound in October 2023 showed a uterus measuring 8.7 x 5.2 x 5.6 cm with a 2.1 cm thickened and heterogenous appearing endometrium, increased vascularity noted with suspected polyps.  A stable 1.8 cm fibroid.  Ovaries noted to be normal in appearance.  Given appearance of polyps, recommendation was made for hysteroscopy with D&C.   D&C and polypectomy from 05/07/22 showed endometrioid carcinoma, grade 1. MMR itnact, p53 wildtype.   AMH on 3/15: 0.037   MRI pelvis 05/19/22: 7 mm small, focal nodular appearing endometrial lesion in the lower uterine segment.  No evidence of myometrial invasion.  Normal ovaries. No adenopathy or other evidence of metastatic disease.   Mirena  IUD placed on 05/22/22.   Endometrial biopsy on 08/28/2022: Benign inactive endometrium, benign cervical glandular mucosa.  No hyperplasia or malignancy.   Endometrial biopsy on 11/26/2022: Endometrium with focal complex nonatypical hyperplasia, mucinous metaplasia and stromal pseudodecidualization consistent with hormone treatment effect.   Endometrial biopsy on 04/14/2023: EIN with stromal progestational effects.  No definitive low-grade endometrial cancer.   EMB 08/27/23: small focus of EIN with background progestational effect.  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  MMRp, p53 WT.  Interval History: Doing well.  Willing to consider definitive  treatment but has some insurance issues that we will need resolution before given cost of surgery.  Past Medical/Surgical History: Past Medical History:  Diagnosis Date   Arthritis    hands, low back   Depression    Environmental and seasonal allergies    Family history of breast cancer    Family history of prostate cancer    Gallstones    05-05-2022  per pt no issues currently   Genital herpes    GERD (gastroesophageal reflux disease)    05-05-2022  per pt watches diet , not meds   Hyperlipidemia    Hypertension    Insomnia    Menorrhagia    Mixed incontinence urge and stress    Morbid obesity (HCC)    OSA (obstructive sleep apnea) 2011   (05-05-2022  per pt tried  cpap but intolerent and did not follow up)  first dx 2011--- last sleep study done by dr dohmeier 01-17-2020 moderate to severe osa,  cpap on back order ,  finally received 04/ 2022   Type 2 diabetes mellitus (HCC)    followed by pcp;   (05-05-2022  per pt check blood sugar 3-4 times daily,  fasting average blood sugar 108-115)   Uterine leiomyoma    Vitiligo    Wears contact lenses     Past Surgical History:  Procedure Laterality Date   COLONOSCOPY WITH PROPOFOL  N/A 09/20/2020   Procedure: COLONOSCOPY WITH PROPOFOL ;  Surgeon: Rollin Dover, MD;  Location: [facility];  Service: Endoscopy;  Laterality: N/A;   DILATATION & CURETTAGE/HYSTEROSCOPY WITH MYOSURE N/A 05/07/2022   Procedure: DILATATION & CURETTAGE/HYSTEROSCOPY WITH MYOSURE;  Surgeon: Delana Ted Morrison, DO;  Location: [facility];  Service: Gynecology;  Laterality: N/A;   WISDOM TOOTH EXTRACTION      Family History  Problem Relation Age of Onset   Breast cancer Mother 26   Ovarian cysts Mother    Hypertension Mother    Diabetes Mother    Stroke Father    Diabetes Father    Cancer Father        bone marrow   Breast cancer Maternal Aunt    Breast cancer Paternal Aunt    Prostate cancer Paternal Uncle    Stroke Maternal  Grandmother    Lung cancer Paternal Grandfather    Breast cancer Other    Colon cancer Neg Hx    Endometrial cancer Neg Hx    Pancreatic cancer Neg Hx     Social History   Socioeconomic History   Marital status: Single    Spouse name: Not on file   Number of children: 0   Years of education: some college   Highest education level: Associate degree: academic program  Occupational History   Occupation: customer service  Tobacco Use   Smoking status: Never   Smokeless tobacco: Never  Vaping Use   Vaping status: Former   Substances: THC  Substance and Sexual Activity   Alcohol use: Not Currently    Comment: rare   Drug use: Not Currently    Types: Marijuana   Sexual activity: Yes    Birth control/protection: None  Other Topics Concern   Not on file  Social History Narrative   Lives with her mother.   Right-handed.   Caffeine use: 1-2 cups per day.   Social Drivers of Health   Financial Resource Strain: High Risk (12/14/2023)   Overall  Financial Resource Strain (CARDIA)    Difficulty of Paying Living Expenses: Very hard  Food Insecurity: Food Insecurity Present (12/14/2023)   Hunger Vital Sign    Worried About Running Out of Food in the Last Year: Sometimes true    Ran Out of Food in the Last Year: Sometimes true  Transportation Needs: No Transportation Needs (12/14/2023)   PRAPARE - Administrator, Civil Service (Medical): No    Lack of Transportation (Non-Medical): No  Physical Activity: Insufficiently Active (12/14/2023)   Exercise Vital Sign    Days of Exercise per Week: 1 day    Minutes of Exercise per Session: 20 min  Stress: No Stress Concern Present (12/14/2023)   [employer] of Occupational Health - Occupational Stress Questionnaire    Feeling of Stress: Not at all  Social Connections: Socially Isolated (12/14/2023)   Social Connection and Isolation Panel    Frequency of Communication with Friends and Family: More than three times a  week    Frequency of Social Gatherings with Friends and Family: Once a week    Attends Religious Services: Never    Database administrator or Organizations: No    Attends Engineer, structural: Not on file    Marital Status: Never married    Current Medications:  Current Outpatient Medications:    megestrol (MEGACE) 40 MG tablet, Take 2 tablets (80 mg total) by mouth 2 (two) times daily., Disp: 120 tablet, Rfl: 6   ACCU-CHEK GUIDE TEST test strip, USE TO check glucose 1-2 TIMES DAILY, Disp: 100 strip, Rfl: 0   amLODipine  (NORVASC ) 5 MG tablet, TAKE 1 TABLET BY MOUTH EVERY MORNING (need office visit), Disp: 30 tablet, Rfl: 0   blood glucose meter kit and supplies KIT, One touch glucometer.   Use up to four times daily as directed. (FOR E11.9)., Disp: 1 each, Rfl: 0   celecoxib  (CELEBREX ) 200 MG capsule, TAKE 1 TO 2 CAPSULES BY MOUTH ONCE DAILY AS NEEDED, Disp: 60 capsule, Rfl: 0   Dulaglutide  (TRULICITY ) 4.5 MG/0.5ML SOAJ, INJECT 4.5 MG into THE SKIN ONCE WEEKLY, Disp: 2 mL, Rfl: 5   fluticasone  (FLONASE ) 50 MCG/ACT nasal spray, Place 2 sprays into both nostrils daily., Disp: 16 g, Rfl: 6   Lancets (ONETOUCH DELICA PLUS LANCET33G) MISC, USE AS DIRECTED TO check sugars 1-2 TIMES A day-will last 50 DAYS, Disp: 100 each, Rfl: 0   levocetirizine (XYZAL ) 5 MG tablet, TAKE 1 TABLET BY MOUTH EVERY DAY AS NEEDED FOR allergies (cough/drainage), Disp: 90 tablet, Rfl: 3   levonorgestrel  (MIRENA ) 20 MCG/DAY IUD, To be placed in the GYN ONC office, Disp: 1 each, Rfl: 0   losartan -hydrochlorothiazide (HYZAAR) 100-25 MG tablet, TAKE 1 TABLET BY MOUTH EVERY MORNING, Disp: 90 tablet, Rfl: 3   metFORMIN  (GLUCOPHAGE ) 1000 MG tablet, TAKE 1 TABLET BY MOUTH 2 TIMES DAILY - IN THE MORNING & AT BEDTIME, Disp: 180 tablet, Rfl: 3   PARoxetine  (PAXIL ) 30 MG tablet, Take 1 tablet (30 mg total) by mouth every morning., Disp: 90 tablet, Rfl: 1   rosuvastatin  (CRESTOR ) 10 MG tablet, TAKE 1 TABLET BY MOUTH EVERY  MORNING, Disp: 90 tablet, Rfl: 3   spironolactone  (ALDACTONE ) 50 MG tablet, Take 1 tablet (50 mg total) by mouth every morning., Disp: 90 tablet, Rfl: 1  Review of Symptoms: Pertinent positives as per HPI.  Physical Exam: Deferred given limitations of phone visit.  Laboratory & Radiologic Studies: A. 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       See comment.   Assessment & Plan: Darlene [name] is a 48 y.o. woman with a history of low-grade endometrioid endometrial adenocarcinoma that originally responded to progestin therapy with Mirena  IUD, most recent biopsies with focal EIN, and now focal grade 1 endometrioid adenocarcinoma in the background of EIN.  I previously discussed my recommendation to proceed with definitive treatment.  The patient is interested in this but given her current insurance status, needs some time before she would be financially ready for surgery.  Discussed options in terms of treatment of endometrial cancer again including definitive surgery, progestin therapy, and radiation treatment.  Given hope that we will be able to proceed with definitive surgery in the next 3-6 months, I suggested addition of progestin to her plan.  She is having some perimenopausal symptoms and we reviewed possibility of putting her on Lupron and starting an antiestrogens such as letrozole.  She is not interested in having additional menopausal symptoms.  She has been taking 10 mg of Provera .  Discussed option of increasing her to treatment dosing oral progesterone that we would use if IUD was not in place.  After reviewing all of the possible options, she wished to proceed with increasing oral progestin.  New prescription for Megace 80 mg twice daily sent to her pharmacy.  Drug information sent via MyChart.  Also discussed since we are now seeing cancer again on her biopsy recommendation to proceed with  imaging to rule out mild invasive disease.  Ideally this would be with an MRI.  Patient was amenable to having me order the MRI.  If cost prohibitive, we will get a pelvic ultrasound instead.  Message sent to our scheduler to help with MRI scheduling.  I discussed the assessment and treatment plan with the patient. The patient was provided with an opportunity to ask questions and all were answered. The patient agreed with the plan and demonstrated an understanding of the instructions.   The patient was advised to call back or see an in-person evaluation if the symptoms worsen or if the condition fails to improve as anticipated.   14 minutes of total time was spent for this patient encounter, including preparation, phone counseling with the patient and coordination of care, and documentation of the encounter.   Comer Dollar, MD  Division of Gynecologic Oncology  Department of Obstetrics and Gynecology  [facility]

## 2023-12-24 ENCOUNTER — Telehealth: Payer: Self-pay | Admitting: *Deleted

## 2023-12-24 NOTE — Telephone Encounter (Signed)
 Per Dr Viktoria patient scheduled for MRI on 10/30 at 3:30 pm at [facility], with an arrival of 3 pm. Patient given the phone number for pre service center with CPT code to check on the cost.  Patient also given the CPT code for US 

## 2023-12-28 ENCOUNTER — Encounter: Payer: Self-pay | Admitting: Family Medicine

## 2023-12-30 ENCOUNTER — Ambulatory Visit: Payer: Self-pay | Admitting: Gynecologic Oncology

## 2023-12-30 ENCOUNTER — Ambulatory Visit (HOSPITAL_COMMUNITY)
Admission: RE | Admit: 2023-12-30 | Discharge: 2023-12-30 | Disposition: A | Discharge status: Home / Self Care | Payer: Self-pay | Source: Ambulatory Visit | Attending: Gynecologic Oncology | Admitting: Gynecologic Oncology

## 2023-12-30 DIAGNOSIS — C541 Malignant neoplasm of endometrium: Secondary | ICD-10-CM | POA: Insufficient documentation

## 2023-12-30 MED ORDER — GADOBUTROL 1 MMOL/ML IV SOLN
10.0000 mL | Freq: Once | INTRAVENOUS | Status: AC | PRN
  Administered 2023-12-30: 10 mL via INTRAVENOUS

## 2024-01-24 ENCOUNTER — Other Ambulatory Visit: Payer: Self-pay | Admitting: Family Medicine

## 2024-01-24 ENCOUNTER — Telehealth: Payer: Self-pay | Admitting: Family Medicine

## 2024-01-24 NOTE — Telephone Encounter (Addendum)
 Patient called and stated that pharmacy is sending refill request for Celebrex . She is calling to make sure she can get a refill because she states they only did the one script the last time. Please advise.

## 2024-02-10 ENCOUNTER — Encounter: Payer: Self-pay | Admitting: Gynecologic Oncology

## 2024-02-10 NOTE — Progress Notes (Signed)
 Returned call to patient from voicemail left after being referred to.  Patient is needing assistance with Cancer medications. Patient currently does not have income. I advised patient what would be needed to apply for the [employer] brands. She verbalized understanding. Had a lengthy conversation with patient addressing her concerns and needs. Encouraged patient to apply for Medicaid if possible for other medications outside of cancer medications and SNAP benefits.  Patient has my card for any additional financial questions or concerns.

## 2024-02-12 ENCOUNTER — Other Ambulatory Visit: Payer: Self-pay | Admitting: Internal Medicine

## 2024-02-16 ENCOUNTER — Other Ambulatory Visit (HOSPITAL_COMMUNITY): Payer: Self-pay

## 2024-02-16 ENCOUNTER — Other Ambulatory Visit: Payer: Self-pay | Admitting: Gynecologic Oncology

## 2024-02-16 DIAGNOSIS — C541 Malignant neoplasm of endometrium: Secondary | ICD-10-CM

## 2024-02-16 MED ORDER — MEGESTROL ACETATE 40 MG PO TABS
80.0000 mg | ORAL_TABLET | Freq: Two times a day (BID) | ORAL | 6 refills | Status: DC
  Filled 2024-02-16: qty 120, 30d supply, fill #0
  Filled 2024-03-19: qty 120, 30d supply, fill #1

## 2024-02-18 ENCOUNTER — Other Ambulatory Visit (HOSPITAL_COMMUNITY): Payer: Self-pay

## 2024-02-21 ENCOUNTER — Other Ambulatory Visit (HOSPITAL_COMMUNITY): Payer: Self-pay

## 2024-02-29 NOTE — Progress Notes (Deleted)
 " Darlene [name] Darlene [name] [address] [place] [address] Phone: [phone] Subjective:    I'm seeing this patient by the request  of:  Geofm Glade PARAS, MD  CC: Bilateral knee pain  YEP:Dlagzrupcz  10/27/2023 Patient has failed formal physical therapy at this time.  Discussed icing regimen and home exercises, discussed which activities to do and which ones to avoid.  Increase activity slowly.  Discussed icing regimen.  Follow-up again in 6 to 8 weeks.  Could be a candidate for viscosupplementation.     Updated 81/07/2024 Darlene [name] is a 48 y.o. female coming in with complaint of B knee pain, known to have some patellofemoral arthritis.  Last injections were in August of last year.  Patient states       Past Medical History:  Diagnosis Date   Arthritis    hands, low back   Depression    Environmental and seasonal allergies    Family history of breast cancer    Family history of prostate cancer    Gallstones    05-05-2022  per pt no issues currently   Genital herpes    GERD (gastroesophageal reflux disease)    05-05-2022  per pt watches diet , not meds   Hyperlipidemia    Hypertension    Insomnia    Menorrhagia    Mixed incontinence urge and stress    Morbid obesity (HCC)    OSA (obstructive sleep apnea) 2011   (05-05-2022  per pt tried  cpap but intolerent and did not follow up)  first dx 2011--- last sleep study done by dr dohmeier 01-17-2020 moderate to severe osa,  cpap on back order ,  finally received 04/ 2022   Type 2 diabetes mellitus (HCC)    followed by pcp;   (05-05-2022  per pt check blood sugar 3-4 times daily,  fasting average blood sugar 108-115)   Uterine leiomyoma    Vitiligo    Wears contact lenses    Past Surgical History:  Procedure Laterality Date   COLONOSCOPY WITH PROPOFOL  N/A 09/20/2020   Procedure: COLONOSCOPY WITH PROPOFOL ;  Surgeon: Rollin Dover, MD;  Location: [facility];  Service: Endoscopy;  Laterality: N/A;    DILATATION & CURETTAGE/HYSTEROSCOPY WITH MYOSURE N/A 05/07/2022   Procedure: DILATATION & CURETTAGE/HYSTEROSCOPY WITH MYOSURE;  Surgeon: Delana Ted Morrison, DO;  Location: [facility];  Service: Gynecology;  Laterality: N/A;   WISDOM TOOTH EXTRACTION     Social History   Socioeconomic History   Marital status: Single    Spouse name: Not on file   Number of children: 0   Years of education: some college   Highest education level: Associate degree: academic program  Occupational History   Occupation: customer service  Tobacco Use   Smoking status: Never   Smokeless tobacco: Never  Vaping Use   Vaping status: Former   Substances: THC  Substance and Sexual Activity   Alcohol use: Not Currently    Comment: rare   Drug use: Not Currently    Types: Marijuana   Sexual activity: Yes    Birth control/protection: None  Other Topics Concern   Not on file  Social History Narrative   Lives with her mother.   Right-handed.   Caffeine use: 1-2 cups per day.   Social Drivers of Health   Tobacco Use: Low Risk (12/23/2023)   Patient History    Smoking Tobacco Use: Never    Smokeless Tobacco Use: Never  Passive Exposure: Not on file  Financial Resource Strain: High Risk (12/14/2023)   Overall Financial Resource Strain (CARDIA)    Difficulty of Paying Living Expenses: Very hard  Food Insecurity: Food Insecurity Present (12/14/2023)   Epic    Worried About Programme Researcher, Broadcasting/film/video in the Last Year: Sometimes true    Ran Out of Food in the Last Year: Sometimes true  Transportation Needs: No Transportation Needs (12/14/2023)   Epic    Lack of Transportation (Medical): No    Lack of Transportation (Non-Medical): No  Physical Activity: Insufficiently Active (12/14/2023)   Exercise Vital Sign    Days of Exercise per Week: 1 day    Minutes of Exercise per Session: 20 min  Stress: No Stress Concern Present (12/14/2023)   [employer] of Occupational Health -  Occupational Stress Questionnaire    Feeling of Stress: Not at all  Social Connections: Socially Isolated (12/14/2023)   Social Connection and Isolation Panel    Frequency of Communication with Friends and Family: More than three times a week    Frequency of Social Gatherings with Friends and Family: Once a week    Attends Religious Services: Never    Database Administrator or Organizations: No    Attends Engineer, Structural: Not on file    Marital Status: Never married  Depression (PHQ2-9): Medium Risk (12/14/2023)   Depression (PHQ2-9)    PHQ-2 Score: 10  Alcohol Screen: Low Risk (06/04/2022)   Alcohol Screen    Last Alcohol Screening Score (AUDIT): 1  Housing: Low Risk (12/14/2023)   Epic    Unable to Pay for Housing in the Last Year: No    Number of Times Moved in the Last Year: 0    Homeless in the Last Year: No  Utilities: Not At Risk (12/01/2023)   Epic    Threatened with loss of utilities: No  Health Literacy: Not on file   Allergies[1] Family History  Problem Relation Age of Onset   Breast cancer Mother 32   Ovarian cysts Mother    Hypertension Mother    Diabetes Mother    Stroke Father    Diabetes Father    Cancer Father        bone marrow   Breast cancer Maternal Aunt    Breast cancer Paternal Aunt    Prostate cancer Paternal Uncle    Stroke Maternal Grandmother    Lung cancer Paternal Grandfather    Breast cancer Other    Colon cancer Neg Hx    Endometrial cancer Neg Hx    Pancreatic cancer Neg Hx     Current Outpatient Medications (Endocrine & Metabolic):    Dulaglutide  (TRULICITY ) 4.5 MG/0.5ML SOAJ, INJECT 4.5 MG into THE SKIN ONCE WEEKLY   levonorgestrel  (MIRENA ) 20 MCG/DAY IUD, To be placed in the GYN ONC office   metFORMIN  (GLUCOPHAGE ) 1000 MG tablet, TAKE 1 TABLET BY MOUTH 2 TIMES DAILY - IN THE MORNING & AT BEDTIME  Current Outpatient Medications (Cardiovascular):    amLODipine  (NORVASC ) 5 MG tablet, TAKE 1 TABLET BY MOUTH EVERY MORNING  (need office visit)   losartan -hydrochlorothiazide (HYZAAR) 100-25 MG tablet, TAKE 1 TABLET BY MOUTH EVERY MORNING   rosuvastatin  (CRESTOR ) 10 MG tablet, TAKE 1 TABLET BY MOUTH EVERY MORNING   spironolactone  (ALDACTONE ) 50 MG tablet, TAKE 1 TABLET BY MOUTH EVERY MORNING  Current Outpatient Medications (Respiratory):    fluticasone  (FLONASE ) 50 MCG/ACT nasal spray, Place 2 sprays into both nostrils daily.   levocetirizine (  XYZAL ) 5 MG tablet, TAKE 1 TABLET BY MOUTH EVERY DAY AS NEEDED FOR allergies (cough/drainage)  Current Outpatient Medications (Analgesics):    celecoxib  (CELEBREX ) 200 MG capsule, TAKE 1 TO 2 CAPSULES BY MOUTH ONCE DAILY AS NEEDED  Current Outpatient Medications (Other):    ACCU-CHEK GUIDE TEST test strip, USE TO check glucose 1-2 TIMES DAILY   blood glucose meter kit and supplies KIT, One touch glucometer.   Use up to four times daily as directed. (FOR E11.9).   Lancets (ONETOUCH DELICA PLUS LANCET33G) MISC, USE AS DIRECTED TO check sugars 1-2 TIMES A day-will last 50 DAYS   megestrol  (MEGACE ) 40 MG tablet, Take 2 tablets (80 mg total) by mouth 2 (two) times daily.   PARoxetine  (PAXIL ) 30 MG tablet, Take 1 tablet (30 mg total) by mouth every morning.   Reviewed prior external information including notes and imaging from  primary care provider As well as notes that were available from care everywhere and other healthcare systems.  Past medical history, social, surgical and family history all reviewed in electronic medical record.  No pertanent information unless stated regarding to the chief complaint.   Review of Systems:  No headache, visual changes, nausea, vomiting, diarrhea, constipation, dizziness, abdominal pain, skin rash, fevers, chills, night sweats, weight loss, swollen lymph nodes, body aches, joint swelling, chest pain, shortness of breath, mood changes. POSITIVE muscle aches  Objective  There were no vitals taken for this visit.   General: No apparent  distress alert and oriented x3 mood and affect normal, dressed appropriately.  HEENT: Pupils equal, extraocular movements intact  Respiratory: Patient's speak in full sentences and does not appear short of breath  Cardiovascular: No lower extremity edema, non tender, no erythema  Knee exam shows   After informed written and verbal consent, patient was seated on exam table. Right knee was prepped with alcohol swab and utilizing anterolateral approach, patient's right knee space was injected with 4:1  marcaine 0.5%: Kenalog 40mg /dL. Patient tolerated the procedure well without immediate complications.  After informed written and verbal consent, patient was seated on exam table. Left knee was prepped with alcohol swab and utilizing anterolateral approach, patient's left knee space was injected with 4:1  marcaine 0.5%: Kenalog 40mg /dL. Patient tolerated the procedure well without immediate complications.    Impression and Recommendations:     The above documentation has been reviewed and is accurate and complete Darlene CHRISTELLA Sharps, DO       [1]  Allergies Allergen Reactions   Cat Dander Other (See Comments)    Difficulty Breathing, Eye irritation   Yeast-Derived Drug Products Other (See Comments)    Per allergy test   Seasonal Ic [Octacosanol]     Pollen- Watery itchy eyes    Chlorpheniramine     Tussionex - caused itching   Apple Rash   Latex Rash   Rice Rash   Tomato Rash and Other (See Comments)    Itchy throat (rash around mouth)   Wound Dressing Adhesive Rash   "

## 2024-03-07 ENCOUNTER — Ambulatory Visit: Payer: Self-pay | Admitting: Family Medicine

## 2024-03-19 ENCOUNTER — Other Ambulatory Visit (HOSPITAL_COMMUNITY): Payer: Self-pay

## 2024-03-23 ENCOUNTER — Inpatient Hospital Stay: Payer: Self-pay | Attending: Gynecologic Oncology | Admitting: Gynecologic Oncology

## 2024-03-23 ENCOUNTER — Encounter: Payer: Self-pay | Admitting: Gynecologic Oncology

## 2024-03-23 VITALS — BP 135/82 | HR 82 | Temp 98.1°F | Resp 19 | Wt 277.8 lb

## 2024-03-23 DIAGNOSIS — Z6841 Body Mass Index (BMI) 40.0 and over, adult: Secondary | ICD-10-CM | POA: Insufficient documentation

## 2024-03-23 DIAGNOSIS — Z975 Presence of (intrauterine) contraceptive device: Secondary | ICD-10-CM | POA: Insufficient documentation

## 2024-03-23 DIAGNOSIS — Z79818 Long term (current) use of other agents affecting estrogen receptors and estrogen levels: Secondary | ICD-10-CM | POA: Insufficient documentation

## 2024-03-23 DIAGNOSIS — C541 Malignant neoplasm of endometrium: Secondary | ICD-10-CM | POA: Insufficient documentation

## 2024-03-23 NOTE — Patient Instructions (Signed)
 It was good to see you today.  I will contact you next week when I have your biopsy results back.  We will tentatively plan on a follow-up visit in 3 months.

## 2024-03-23 NOTE — Progress Notes (Signed)
 Gynecologic Oncology Return Clinic Visit  03/23/24  Reason for Visit: follow-up  Treatment History: Oncology History  Endometrial cancer (HCC)  06/04/2022 Initial Diagnosis   Endometrial cancer (HCC)   11/03/2023 Genetic Testing   Negative genetic testing on the CancerNext-Expanded+RNAinsight panel.  The report date is November 03, 2023.  The CancerNext-Expanded gene panel offered by [facility] and includes sequencing, rearrangement, and RNA analysis for the following 77 genes: AIP, ALK, APC, ATM, BAP1, BARD1, BMPR1A, BRCA1, BRCA2, BRIP1, CDC73, CDH1, CDK4, CDKN1B, CDKN2A, CEBPA, CHEK2, CTNNA1, DDX41, DICER1, ETV6, FH, FLCN, GATA2, LZTR1, MAX, MBD4, MEN1, MET, MLH1, MSH2, MSH3, MSH6, MUTYH, NF1, NF2, NTHL1, PALB2, PHOX2B, PMS2, POT1, PRKAR1A, PTCH1, PTEN, RAD51C, RAD51D, RB1, RET, RPS20, RUNX1, SDHA, SDHAF2, SDHB, SDHC, SDHD, SMAD4, SMARCA4, SMARCB1, SMARCE1, STK11, SUFU, TMEM127, TP53, TSC1, TSC2, VHL, and WT1 (sequencing and deletion/duplication); AXIN2, CTNNA1, DDX41, EGFR, HOXB13, KIT, MBD4, MITF, MSH3, PDGFRA, POLD1 and POLE (sequencing only); EPCAM and GREM1 (deletion/duplication only). RNA data is routinely analyzed for use in variant interpretation for all genes.     Patient was seen in September 2021 and at that time had a thickened endometrial lining noted on imaging.  She opted to undergo hysteroscopy with D&C rather than endometrial biopsy but did not get this scheduled.  She was started on Provera , which she took for 1 month to regulate her menses, but then discontinued the medication.  Repeat ultrasound in 11/2020 showed an endometrial lining of 9 mm.  Patient was seen again in October 2023 and noted menstrual cycle that had been ongoing for a month.  She has a history of abnormal cycles, will often skip multiple months at a time.  When she does have menses, she will bleed for a prolonged period.  Office pelvic ultrasound in October 2023 showed a uterus measuring 8.7 x 5.2 x 5.6 cm  with a 2.1 cm thickened and heterogenous appearing endometrium, increased vascularity noted with suspected polyps.  A stable 1.8 cm fibroid.  Ovaries noted to be normal in appearance.  Given appearance of polyps, recommendation was made for hysteroscopy with D&C.   D&C and polypectomy from 05/07/22 showed endometrioid carcinoma, grade 1. MMR itnact, p53 wildtype.   AMH on 3/15: 0.037   MRI pelvis 05/19/22: 7 mm small, focal nodular appearing endometrial lesion in the lower uterine segment.  No evidence of myometrial invasion.  Normal ovaries. No adenopathy or other evidence of metastatic disease.   Mirena  IUD placed on 05/22/22.   Endometrial biopsy on 08/28/2022: Benign inactive endometrium, benign cervical glandular mucosa.  No hyperplasia or malignancy.   Endometrial biopsy on 11/26/2022: Endometrium with focal complex nonatypical hyperplasia, mucinous metaplasia and stromal pseudodecidualization consistent with hormone treatment effect.   Endometrial biopsy on 04/14/2023: EIN with stromal progestational effects.  No definitive low-grade endometrial cancer.   EMB 08/27/23: small focus of EIN with background progestational effect.   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  MMRp, p53 WT.  Started oral progestin after treatment discussion with tentative plan to move toward definitive surgery in the next 3-6 months.  Interval History: Doing well.  Started the Megace  about a month ago.  Denies any significant bleeding since then.  Several days in the last week has had pink a few times when she wiped and has seen of small clot in the toilet.  Has some very slight cramping currently but nothing painful.  Thinks she also has some bloating.  Unfortunately, her sister has been in the hospital and in  rehab.  She came in with DKA and was diagnosed with necrotizing fasciitis.  Has had multiple surgeries.  Past Medical/Surgical History: Past Medical History:  Diagnosis  Date   Arthritis    hands, low back   Depression    Environmental and seasonal allergies    Family history of breast cancer    Family history of prostate cancer    Gallstones    05-05-2022  per pt no issues currently   Genital herpes    GERD (gastroesophageal reflux disease)    05-05-2022  per pt watches diet , not meds   Hyperlipidemia    Hypertension    Insomnia    Menorrhagia    Mixed incontinence urge and stress    Morbid obesity (HCC)    OSA (obstructive sleep apnea) 2011   (05-05-2022  per pt tried  cpap but intolerent and did not follow up)  first dx 2011--- last sleep study done by dr dohmeier 01-17-2020 moderate to severe osa,  cpap on back order ,  finally received 04/ 2022   Type 2 diabetes mellitus (HCC)    followed by pcp;   (05-05-2022  per pt check blood sugar 3-4 times daily,  fasting average blood sugar 108-115)   Uterine leiomyoma    Vitiligo    Wears contact lenses     Past Surgical History:  Procedure Laterality Date   COLONOSCOPY WITH PROPOFOL  N/A 09/20/2020   Procedure: COLONOSCOPY WITH PROPOFOL ;  Surgeon: Rollin Dover, MD;  Location: [facility];  Service: Endoscopy;  Laterality: N/A;   DILATATION & CURETTAGE/HYSTEROSCOPY WITH MYOSURE N/A 05/07/2022   Procedure: DILATATION & CURETTAGE/HYSTEROSCOPY WITH MYOSURE;  Surgeon: Delana Ted Morrison, DO;  Location: [facility];  Service: Gynecology;  Laterality: N/A;   WISDOM TOOTH EXTRACTION      Family History  Problem Relation Age of Onset   Breast cancer Mother 28   Ovarian cysts Mother    Hypertension Mother    Diabetes Mother    Stroke Father    Diabetes Father    Cancer Father        bone marrow   Breast cancer Maternal Aunt    Breast cancer Paternal Aunt    Prostate cancer Paternal Uncle    Stroke Maternal Grandmother    Lung cancer Paternal Grandfather    Breast cancer Other    Colon cancer Neg Hx    Endometrial cancer Neg Hx    Pancreatic cancer Neg Hx     Social  History   Socioeconomic History   Marital status: Single    Spouse name: Not on file   Number of children: 0   Years of education: some college   Highest education level: Associate degree: academic program  Occupational History   Occupation: customer service  Tobacco Use   Smoking status: Never   Smokeless tobacco: Never  Vaping Use   Vaping status: Former   Substances: THC  Substance and Sexual Activity   Alcohol use: Not Currently    Comment: rare   Drug use: Not Currently    Types: Marijuana   Sexual activity: Yes    Birth control/protection: None  Other Topics Concern   Not on file  Social History Narrative   Lives with her mother.   Right-handed.   Caffeine use: 1-2 cups per day.   Social Drivers of Health   Tobacco Use: Low Risk (03/23/2024)   Patient History    Smoking Tobacco Use: Never    Smokeless Tobacco Use:  Never    Passive Exposure: Not on file  Financial Resource Strain: High Risk (12/14/2023)   Overall Financial Resource Strain (CARDIA)    Difficulty of Paying Living Expenses: Very hard  Food Insecurity: Food Insecurity Present (12/14/2023)   Epic    Worried About Programme Researcher, Broadcasting/film/video in the Last Year: Sometimes true    Ran Out of Food in the Last Year: Sometimes true  Transportation Needs: No Transportation Needs (12/14/2023)   Epic    Lack of Transportation (Medical): No    Lack of Transportation (Non-Medical): No  Physical Activity: Insufficiently Active (12/14/2023)   Exercise Vital Sign    Days of Exercise per Week: 1 day    Minutes of Exercise per Session: 20 min  Stress: No Stress Concern Present (12/14/2023)   [employer] of Occupational Health - Occupational Stress Questionnaire    Feeling of Stress: Not at all  Social Connections: Socially Isolated (12/14/2023)   Social Connection and Isolation Panel    Frequency of Communication with Friends and Family: More than three times a week    Frequency of Social Gatherings with Friends  and Family: Once a week    Attends Religious Services: Never    Database Administrator or Organizations: No    Attends Engineer, Structural: Not on file    Marital Status: Never married  Depression (PHQ2-9): Medium Risk (12/14/2023)   Depression (PHQ2-9)    PHQ-2 Score: 10  Alcohol Screen: Low Risk (06/04/2022)   Alcohol Screen    Last Alcohol Screening Score (AUDIT): 1  Housing: Low Risk (12/14/2023)   Epic    Unable to Pay for Housing in the Last Year: No    Number of Times Moved in the Last Year: 0    Homeless in the Last Year: No  Utilities: Not At Risk (12/01/2023)   Epic    Threatened with loss of utilities: No  Health Literacy: Not on file    Current Medications: Current Medications[1]  Review of Systems: + Incontinence, hot flashes, menstrual problems, joint pain, anxiety, depression Denies appetite changes, fevers, chills, fatigue, unexplained weight changes. Denies hearing loss, neck lumps or masses, mouth sores, ringing in ears or voice changes. Denies cough or wheezing.  Denies shortness of breath. Denies chest pain or palpitations. Denies leg swelling. Denies abdominal distention, pain, blood in stools, constipation, diarrhea, nausea, vomiting, or early satiety. Denies pain with intercourse, dysuria, frequency, hematuria. Denies hot flashes, pelvic pain or vaginal discharge.   Denies back pain or muscle pain/cramps. Denies itching, rash, or wounds. Denies dizziness, headaches, numbness or seizures. Denies swollen lymph nodes or glands, denies easy bruising or bleeding. Denies confusion or decreased concentration.  Physical Exam: BP 135/82 (BP Location: Left Arm, Patient Position: Sitting)   Pulse 82   Temp 98.1 F (36.7 C)   Resp 19   Wt 277 lb 12.8 oz (126 kg)   SpO2 100%   BMI 47.68 kg/m  General: Alert, oriented, no acute distress. HEENT: Posterior oropharynx clear, sclera anicteric. Abdomen: Obese, soft, nontender.  Normoactive bowel sounds.   No masses or hepatosplenomegaly appreciated.   Extremities: Grossly normal range of motion.  Warm, well perfused.  No edema bilaterally. GU: Normal appearing external genitalia without erythema, excoriation, or lesions.  Speculum exam reveals normal cervix, IUD strings appreciated.   Endometrial biopsy procedure Preoperative diagnosis: Low-grade endometrial cancer Postoperative diagnosis: Same as above Physician: Viktoria MD Estimated blood loss: Minimal Specimens: Endometrial biopsy Procedure: After the procedure was discussed  with the patient including risks and benefits, she gave consent.  She was then placed in dorsolithotomy position and a speculum was placed in the vagina.  Once the cervix was well visualized it was cleansed with Betadine  x3.  A single-tooth tenaculum was placed on the anterior lip of the cervix.  An endometrial Pipelle was then passed to a depth of 7 cm.  1 pass was performed with sufficient tissue obtained.  This was placed in formalin.  Overall the patient tolerated the procedure well.  All instruments were removed from the vagina.  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   12/30/23: MRI pelvis 1. Thickened heterogeneous endometrium measuring up to 9 mm in thickness. No discrete enhancing endometrial lesion seen. 2. Diffusely thickened junctional zone measuring up to 12 mm, compatible with adenomyosis. 3. Focal lobulation along the lower anterior uterine body, which may represent subserosal leiomyoma. 4. No lymphadenopathy in the pelvis.  Assessment & Plan: Darlene [name] is a 49 y.o. woman with a history of low-grade endometrioid endometrial adenocarcinoma that originally responded to progestin therapy with Mirena  IUD, most recent biopsies with focal EIN, and now focal grade 1 endometrioid adenocarcinoma in the background of EIN.   Patient with decrease in  bleeding/spotting since starting Megace .  She is tolerating this well.  Endometrial biopsy performed today.  I will contact her with these results next week.  We will tentatively plan on follow-up in 3 months.  We had previously discussed moving towards definitive surgery in the next 3-6 months at the time of our phone visit in October.  Given everything happening with her sister, we will table this discussion for now as long as her biopsy does not show progression.  24 minutes of total time was spent for this patient encounter, including preparation, face-to-face counseling with the patient and coordination of care, and documentation of the encounter.  Comer Dollar, MD  Division of Gynecologic Oncology  Department of Obstetrics and Gynecology  [facility]      [1]  Current Outpatient Medications:    ACCU-CHEK GUIDE TEST test strip, USE TO check glucose 1-2 TIMES DAILY, Disp: 100 strip, Rfl: 0   amLODipine  (NORVASC ) 5 MG tablet, TAKE 1 TABLET BY MOUTH EVERY MORNING (need office visit), Disp: 30 tablet, Rfl: 0   blood glucose meter kit and supplies KIT, One touch glucometer.   Use up to four times daily as directed. (FOR E11.9)., Disp: 1 each, Rfl: 0   celecoxib  (CELEBREX ) 200 MG capsule, TAKE 1 TO 2 CAPSULES BY MOUTH ONCE DAILY AS NEEDED, Disp: 60 capsule, Rfl: 0   Dulaglutide  (TRULICITY ) 4.5 MG/0.5ML SOAJ, INJECT 4.5 MG into THE SKIN ONCE WEEKLY, Disp: 2 mL, Rfl: 5   fluticasone  (FLONASE ) 50 MCG/ACT nasal spray, Place 2 sprays into both nostrils daily., Disp: 16 g, Rfl: 6   Lancets (ONETOUCH DELICA PLUS LANCET33G) MISC, USE AS DIRECTED TO check sugars 1-2 TIMES A day-will last 50 DAYS, Disp: 100 each, Rfl: 0   levocetirizine (XYZAL ) 5 MG tablet, TAKE 1 TABLET BY MOUTH EVERY DAY AS NEEDED FOR allergies (cough/drainage), Disp: 90 tablet, Rfl: 3   levonorgestrel  (MIRENA ) 20 MCG/DAY IUD, To be placed in the GYN ONC office, Disp: 1 each, Rfl: 0    losartan -hydrochlorothiazide (HYZAAR) 100-25 MG tablet, TAKE 1 TABLET BY MOUTH EVERY MORNING, Disp: 90 tablet, Rfl: 3   megestrol  (MEGACE ) 40 MG tablet, Take 2 tablets (80 mg  total) by mouth 2 (two) times daily., Disp: 120 tablet, Rfl: 6   metFORMIN  (GLUCOPHAGE ) 1000 MG tablet, TAKE 1 TABLET BY MOUTH 2 TIMES DAILY - IN THE MORNING & AT BEDTIME, Disp: 180 tablet, Rfl: 3   PARoxetine  (PAXIL ) 30 MG tablet, Take 1 tablet (30 mg total) by mouth every morning., Disp: 90 tablet, Rfl: 1   rosuvastatin  (CRESTOR ) 10 MG tablet, TAKE 1 TABLET BY MOUTH EVERY MORNING, Disp: 90 tablet, Rfl: 3   spironolactone  (ALDACTONE ) 50 MG tablet, TAKE 1 TABLET BY MOUTH EVERY MORNING, Disp: 90 tablet, Rfl: 1

## 2024-03-27 LAB — SURGICAL PATHOLOGY

## 2024-03-28 ENCOUNTER — Encounter: Payer: Self-pay | Admitting: Gynecologic Oncology

## 2024-03-28 NOTE — Progress Notes (Signed)
 Patient was approved on 02/18/24 for one-time $1000 [employer] grant to assist with personal expenses while going through treatment. She received a copy of the approval letter and expense sheet via docusign to review and discuss at her earliest convenience.   She has my contact information for any additional financial questions or concerns.

## 2024-03-30 ENCOUNTER — Other Ambulatory Visit: Payer: Self-pay | Admitting: Gynecologic Oncology

## 2024-03-30 DIAGNOSIS — C541 Malignant neoplasm of endometrium: Secondary | ICD-10-CM

## 2024-03-30 MED ORDER — MEGESTROL ACETATE 40 MG PO TABS: 120.0000 mg | ORAL_TABLET | Freq: Two times a day (BID) | ORAL | 6 refills | Status: AC

## 2024-03-30 NOTE — Progress Notes (Signed)
 Called patient number recent biopsy which shows foci of low-grade endometrial cancer in the background of EIN.  Given everything going on medically with her sister, she is not in a position to discuss definitive therapy now.  She is scheduled to see me in 3 months.  Discussed option of going up on her Megace  dose, which she is amenable to do.  New prescription sent for her to take 120 mg twice daily.  Comer Dollar MD Gynecologic Oncology

## 2024-03-31 ENCOUNTER — Telehealth: Payer: Self-pay | Admitting: Cardiology

## 2024-03-31 DIAGNOSIS — Z7689 Persons encountering health services in other specified circumstances: Secondary | ICD-10-CM

## 2024-03-31 NOTE — Telephone Encounter (Signed)
 Dr. Sheena has sent a referral to [facility] for Dr. Rosalva. [facility] sent the referral back stating Dr. Rosalva is no longer a provider at [facility]. The fax was uploaded into the media tab. Please advise

## 2024-04-03 ENCOUNTER — Other Ambulatory Visit: Payer: Self-pay | Admitting: Family Medicine

## 2024-04-03 NOTE — Addendum Note (Signed)
 Addended by: JOSHUA ANDREZ PARAS on: 04/03/2024 04:59 PM   Modules accepted: Orders

## 2024-04-20 ENCOUNTER — Ambulatory Visit: Payer: Self-pay | Admitting: Family Medicine

## 2024-05-16 ENCOUNTER — Encounter: Payer: Self-pay | Admitting: Obstetrics and Gynecology

## 2024-06-02 ENCOUNTER — Inpatient Hospital Stay: Payer: Self-pay | Admitting: Gynecologic Oncology

## 2024-06-13 ENCOUNTER — Ambulatory Visit: Payer: Self-pay | Admitting: Internal Medicine
